# Patient Record
Sex: Female | Born: 1960 | Race: White | Hispanic: No | Marital: Married | Smoking: Current every day smoker
Health system: Southern US, Community
[De-identification: ages and names within clinical notes are randomized; demographics above are authoritative.]

## PROBLEM LIST (undated history)

## (undated) DIAGNOSIS — C801 Malignant (primary) neoplasm, unspecified: Secondary | ICD-10-CM

## (undated) DIAGNOSIS — M419 Scoliosis, unspecified: Secondary | ICD-10-CM

## (undated) DIAGNOSIS — M519 Unspecified thoracic, thoracolumbar and lumbosacral intervertebral disc disorder: Secondary | ICD-10-CM

## (undated) DIAGNOSIS — G510 Bell's palsy: Secondary | ICD-10-CM

## (undated) DIAGNOSIS — M8580 Other specified disorders of bone density and structure, unspecified site: Secondary | ICD-10-CM

## (undated) DIAGNOSIS — M1990 Unspecified osteoarthritis, unspecified site: Secondary | ICD-10-CM

## (undated) DIAGNOSIS — M5430 Sciatica, unspecified side: Secondary | ICD-10-CM

## (undated) DIAGNOSIS — M797 Fibromyalgia: Secondary | ICD-10-CM

## (undated) HISTORY — DX: Unspecified thoracic, thoracolumbar and lumbosacral intervertebral disc disorder: M51.9

## (undated) HISTORY — DX: Unspecified osteoarthritis, unspecified site: M19.90

## (undated) HISTORY — DX: Fibromyalgia: M79.7

## (undated) HISTORY — DX: Other specified disorders of bone density and structure, unspecified site: M85.80

## (undated) HISTORY — DX: Scoliosis, unspecified: M41.9

## (undated) HISTORY — DX: Sciatica, unspecified side: M54.30

## (undated) HISTORY — DX: Malignant (primary) neoplasm, unspecified: C80.1

---

## 1993-07-20 HISTORY — PX: NOSE SURGERY: SHX723

## 1999-11-07 ENCOUNTER — Encounter (INDEPENDENT_AMBULATORY_CARE_PROVIDER_SITE_OTHER): Payer: Self-pay | Admitting: Specialist

## 1999-11-07 ENCOUNTER — Ambulatory Visit (HOSPITAL_COMMUNITY): Admission: RE | Admit: 1999-11-07 | Discharge: 1999-11-07 | Payer: Self-pay | Admitting: Gastroenterology

## 2000-01-22 ENCOUNTER — Ambulatory Visit (HOSPITAL_COMMUNITY): Admission: RE | Admit: 2000-01-22 | Discharge: 2000-01-22 | Payer: Self-pay | Admitting: Gastroenterology

## 2000-01-22 ENCOUNTER — Encounter: Payer: Self-pay | Admitting: Gastroenterology

## 2000-02-18 HISTORY — PX: CHOLECYSTECTOMY: SHX55

## 2000-03-10 ENCOUNTER — Encounter (INDEPENDENT_AMBULATORY_CARE_PROVIDER_SITE_OTHER): Payer: Self-pay | Admitting: Specialist

## 2000-03-10 ENCOUNTER — Observation Stay (HOSPITAL_COMMUNITY): Admission: RE | Admit: 2000-03-10 | Discharge: 2000-03-10 | Payer: Self-pay | Admitting: Surgery

## 2001-07-20 HISTORY — PX: BUNIONECTOMY: SHX129

## 2001-12-27 ENCOUNTER — Other Ambulatory Visit: Admission: RE | Admit: 2001-12-27 | Discharge: 2001-12-27 | Payer: Self-pay | Admitting: Obstetrics and Gynecology

## 2002-03-09 ENCOUNTER — Encounter: Payer: Self-pay | Admitting: Emergency Medicine

## 2002-03-09 ENCOUNTER — Emergency Department (HOSPITAL_COMMUNITY): Admission: EM | Admit: 2002-03-09 | Discharge: 2002-03-09 | Payer: Self-pay | Admitting: Emergency Medicine

## 2002-03-24 ENCOUNTER — Encounter: Admission: RE | Admit: 2002-03-24 | Discharge: 2002-03-24 | Payer: Self-pay | Admitting: Orthopaedic Surgery

## 2002-03-24 ENCOUNTER — Encounter: Payer: Self-pay | Admitting: Orthopaedic Surgery

## 2002-10-01 ENCOUNTER — Emergency Department (HOSPITAL_COMMUNITY): Admission: EM | Admit: 2002-10-01 | Discharge: 2002-10-01 | Payer: Self-pay | Admitting: Emergency Medicine

## 2002-10-19 HISTORY — PX: NECK SURGERY: SHX720

## 2002-10-25 ENCOUNTER — Encounter: Payer: Self-pay | Admitting: Orthopaedic Surgery

## 2002-10-25 ENCOUNTER — Inpatient Hospital Stay (HOSPITAL_COMMUNITY): Admission: RE | Admit: 2002-10-25 | Discharge: 2002-10-26 | Payer: Self-pay | Admitting: Orthopaedic Surgery

## 2002-10-26 ENCOUNTER — Encounter: Payer: Self-pay | Admitting: Orthopaedic Surgery

## 2003-02-13 ENCOUNTER — Other Ambulatory Visit: Admission: RE | Admit: 2003-02-13 | Discharge: 2003-02-13 | Payer: Self-pay | Admitting: Obstetrics and Gynecology

## 2003-02-16 ENCOUNTER — Encounter: Payer: Self-pay | Admitting: Otolaryngology

## 2003-02-16 ENCOUNTER — Ambulatory Visit (HOSPITAL_COMMUNITY): Admission: RE | Admit: 2003-02-16 | Discharge: 2003-02-16 | Payer: Self-pay | Admitting: Otolaryngology

## 2003-07-10 ENCOUNTER — Emergency Department (HOSPITAL_COMMUNITY): Admission: EM | Admit: 2003-07-10 | Discharge: 2003-07-11 | Payer: Self-pay | Admitting: Emergency Medicine

## 2004-02-15 ENCOUNTER — Ambulatory Visit (HOSPITAL_COMMUNITY): Admission: RE | Admit: 2004-02-15 | Discharge: 2004-02-15 | Payer: Self-pay | Admitting: Neurology

## 2004-05-20 HISTORY — PX: BACK SURGERY: SHX140

## 2004-05-21 ENCOUNTER — Inpatient Hospital Stay (HOSPITAL_COMMUNITY): Admission: RE | Admit: 2004-05-21 | Discharge: 2004-05-26 | Payer: Self-pay | Admitting: Orthopaedic Surgery

## 2005-01-07 ENCOUNTER — Other Ambulatory Visit: Admission: RE | Admit: 2005-01-07 | Discharge: 2005-01-07 | Payer: Self-pay | Admitting: Obstetrics and Gynecology

## 2006-04-14 ENCOUNTER — Encounter: Admission: RE | Admit: 2006-04-14 | Discharge: 2006-04-14 | Payer: Self-pay | Admitting: Orthopaedic Surgery

## 2006-05-24 ENCOUNTER — Encounter: Admission: RE | Admit: 2006-05-24 | Discharge: 2006-05-24 | Payer: Self-pay | Admitting: Orthopaedic Surgery

## 2006-08-24 ENCOUNTER — Encounter: Admission: RE | Admit: 2006-08-24 | Discharge: 2006-08-24 | Payer: Self-pay | Admitting: Orthopaedic Surgery

## 2006-09-29 ENCOUNTER — Encounter: Admission: RE | Admit: 2006-09-29 | Discharge: 2006-09-29 | Payer: Self-pay | Admitting: Orthopaedic Surgery

## 2006-11-11 ENCOUNTER — Encounter: Admission: RE | Admit: 2006-11-11 | Discharge: 2006-11-11 | Payer: Self-pay | Admitting: Orthopaedic Surgery

## 2006-12-31 ENCOUNTER — Encounter: Admission: RE | Admit: 2006-12-31 | Discharge: 2006-12-31 | Payer: Self-pay | Admitting: Orthopaedic Surgery

## 2007-01-26 ENCOUNTER — Encounter: Admission: RE | Admit: 2007-01-26 | Discharge: 2007-01-26 | Payer: Self-pay | Admitting: Orthopaedic Surgery

## 2007-02-17 ENCOUNTER — Encounter
Admission: RE | Admit: 2007-02-17 | Discharge: 2007-05-18 | Payer: Self-pay | Admitting: Physical Medicine & Rehabilitation

## 2007-02-17 ENCOUNTER — Ambulatory Visit: Payer: Self-pay | Admitting: Physical Medicine & Rehabilitation

## 2007-03-28 ENCOUNTER — Ambulatory Visit: Payer: Self-pay | Admitting: Physical Medicine & Rehabilitation

## 2007-04-25 ENCOUNTER — Ambulatory Visit: Payer: Self-pay | Admitting: Physical Medicine & Rehabilitation

## 2007-06-21 ENCOUNTER — Ambulatory Visit: Payer: Self-pay | Admitting: Physical Medicine & Rehabilitation

## 2007-06-21 ENCOUNTER — Encounter
Admission: RE | Admit: 2007-06-21 | Discharge: 2007-09-19 | Payer: Self-pay | Admitting: Physical Medicine & Rehabilitation

## 2007-08-11 ENCOUNTER — Ambulatory Visit: Payer: Self-pay | Admitting: Physical Medicine & Rehabilitation

## 2007-10-06 ENCOUNTER — Encounter
Admission: RE | Admit: 2007-10-06 | Discharge: 2008-01-04 | Payer: Self-pay | Admitting: Physical Medicine & Rehabilitation

## 2007-10-06 ENCOUNTER — Ambulatory Visit: Payer: Self-pay | Admitting: Physical Medicine & Rehabilitation

## 2007-11-07 ENCOUNTER — Ambulatory Visit: Payer: Self-pay | Admitting: Physical Medicine & Rehabilitation

## 2007-12-01 ENCOUNTER — Ambulatory Visit: Payer: Self-pay | Admitting: Physical Medicine & Rehabilitation

## 2007-12-28 ENCOUNTER — Ambulatory Visit: Payer: Self-pay | Admitting: Physical Medicine & Rehabilitation

## 2008-01-25 ENCOUNTER — Encounter
Admission: RE | Admit: 2008-01-25 | Discharge: 2008-04-13 | Payer: Self-pay | Admitting: Physical Medicine & Rehabilitation

## 2008-01-26 ENCOUNTER — Ambulatory Visit: Payer: Self-pay | Admitting: Physical Medicine & Rehabilitation

## 2008-02-17 ENCOUNTER — Ambulatory Visit: Payer: Self-pay | Admitting: Physical Medicine & Rehabilitation

## 2008-03-16 ENCOUNTER — Ambulatory Visit: Payer: Self-pay | Admitting: Physical Medicine & Rehabilitation

## 2008-04-13 ENCOUNTER — Ambulatory Visit: Payer: Self-pay | Admitting: Physical Medicine & Rehabilitation

## 2008-05-02 ENCOUNTER — Encounter
Admission: RE | Admit: 2008-05-02 | Discharge: 2008-07-31 | Payer: Self-pay | Admitting: Physical Medicine & Rehabilitation

## 2008-05-11 ENCOUNTER — Ambulatory Visit: Payer: Self-pay | Admitting: Physical Medicine & Rehabilitation

## 2008-06-08 ENCOUNTER — Ambulatory Visit: Payer: Self-pay | Admitting: Physical Medicine & Rehabilitation

## 2008-08-01 ENCOUNTER — Encounter
Admission: RE | Admit: 2008-08-01 | Discharge: 2008-08-03 | Payer: Self-pay | Admitting: Physical Medicine & Rehabilitation

## 2008-08-03 ENCOUNTER — Ambulatory Visit: Payer: Self-pay | Admitting: Physical Medicine & Rehabilitation

## 2008-09-26 ENCOUNTER — Ambulatory Visit: Payer: Self-pay | Admitting: Physical Medicine & Rehabilitation

## 2008-09-26 ENCOUNTER — Encounter
Admission: RE | Admit: 2008-09-26 | Discharge: 2008-11-21 | Payer: Self-pay | Admitting: Physical Medicine & Rehabilitation

## 2008-11-21 ENCOUNTER — Ambulatory Visit: Payer: Self-pay | Admitting: Physical Medicine & Rehabilitation

## 2009-01-10 ENCOUNTER — Encounter
Admission: RE | Admit: 2009-01-10 | Discharge: 2009-04-10 | Payer: Self-pay | Admitting: Physical Medicine & Rehabilitation

## 2009-01-15 ENCOUNTER — Ambulatory Visit: Payer: Self-pay | Admitting: Physical Medicine & Rehabilitation

## 2009-02-21 ENCOUNTER — Ambulatory Visit: Payer: Self-pay | Admitting: Physical Medicine & Rehabilitation

## 2009-03-13 ENCOUNTER — Ambulatory Visit: Payer: Self-pay | Admitting: Physical Medicine & Rehabilitation

## 2009-04-11 ENCOUNTER — Ambulatory Visit: Payer: Self-pay | Admitting: Physical Medicine & Rehabilitation

## 2009-04-11 ENCOUNTER — Encounter
Admission: RE | Admit: 2009-04-11 | Discharge: 2009-07-10 | Payer: Self-pay | Admitting: Physical Medicine & Rehabilitation

## 2009-05-13 ENCOUNTER — Ambulatory Visit: Payer: Self-pay | Admitting: Physical Medicine & Rehabilitation

## 2009-06-11 ENCOUNTER — Ambulatory Visit: Payer: Self-pay | Admitting: Physical Medicine & Rehabilitation

## 2010-08-09 ENCOUNTER — Encounter: Payer: Self-pay | Admitting: Internal Medicine

## 2010-08-10 ENCOUNTER — Encounter: Payer: Self-pay | Admitting: Otolaryngology
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
## 2010-12-02 NOTE — Assessment & Plan Note (Signed)
HISTORY OF PRESENT ILLNESS:  Sheryl Campbell is back regarding her back pain.  She initially had good results with the therapy that integrated but  November was a bad month for her, and she notes that the pain seemed to  flare up again.  This seems to be the overall cycle for her.  She does  perform her maintenance exercises and has had further therapy.  She gets  fitted with her cervical traction unit this week.  She does feel that  stretching helps temporarily at least.  She feels that her posture is  overall better.  She is having a new chair ordered for her at work to  improve her ergonomic situation.  The patient rates her pain on average  a 3-8/10.  She describes it as intermittent, sharp, aching, and  tingling.  The pain is in both shoulders but most predominantly the left  shoulder around the scapula.  The pain is not always consistent in  location.  She also is having some low back and leg pain once again  today on the left side.  The patient is on a fentanyl path 12 mcg q. 72  hour with Norco for breakthrough pain.  She did not find the trigger  point injections, we did in October, overly beneficial.   REVIEW OF SYSTEMS:  Notable for tingling, dizziness, some anxiety.  Other pertinent positives listed above.  A full review is written in  health history section of chart.   SOCIAL HISTORY:  Without change.  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   PHYSICAL EXAMINATION:  VITAL SIGNS:  Blood pressure 145/94, pulse 110,  respiratory rate 18, she is sating 100% on room air.  GENERAL:  The patient is pleasant, alert and oriented x3.  Affect is  bright and appropriate.  Gait is generally stable.  EXTREMITIES:  She continues to have some asymmetry in her scapulae with  some tightness particularly in the right rhomboid region.  The scapulae  appear to be rotated somewhat to the left.  The pain did not appear to  be specifically provoked with any  particular range of motion or  palpation today.  No focal trigger points were appreciated.  Muscle  function is 5/5.  Sensory exam is intact.  Postoperative scarring noted.  HEART:  Regular rate and rhythm.  LUNGS:  Clear.  ABDOMEN:  Soft and nontender.   ASSESSMENT:  1. Chronic low back, related to childhood scoliosis with Huntington      rod placement.  The patient with persistent thoracic and low back      pain.  She also has had a cervical discectomy and fusion for disk      disease.  2. Leg length discrepancy on the left of 1.5 cm.  3. Myofascial cervical and parascapular dysfunction, although this is      somewhat improved after therapies.   PLAN:  1. We will hold off on further trigger point injections today, as      these did not prove to be overly beneficial.  2. We will increase the Duragesic patch to 25 mcg q. 72 hours with      Norco for breakthrough pain.  3. Change muscle relaxant from Robaxin to [facility] and see if this      provides her any extra benefit.  4. Begin home cervical traction.  5. We will see her back in followup in about a month's time in the  nurse clinic and I will see her back in 2      months.  Consider low back intervention, depending on her course      there.  6. Observe the effects of ergonomic adjustments in the office place.      [name], M.D.  Electronically Signed     ZTS/MedQ  D:  06/22/2007 11:31:42  T:  06/22/2007 13:52:39  Job #:  [id number]   cc:   Sharolyn Douglas, M.D.  Fax: [phone]

## 2010-12-02 NOTE — Assessment & Plan Note (Signed)
Sheryl Campbell is back regarding chronic cervical and low back pain.  Pain has  been remained generally stable around 4/10.  She has had some flares  recently as she has been active with her children at the beach and doing  some painting at home.  She describes her pain as sharp, stabbing,  aching, tingling.  Pain interferes with general activity, relations with  others, enjoyment of life on a moderate level.  Sleep is fair.  She  remains on a fentanyl patch 50 mcg q.72 h. and Norco 10/325 one q.6 h.  p.r.n. for breakthrough pain.  She has Cymbalta daily still for her mood  and neuropathic symptoms.   REVIEW OF SYSTEMS:  Unchanged.  Full 14-point review is in the written  health and history section of the chart.   SOCIAL HISTORY:  The patient is married, living with her husband.  She  is still smoking a pack of cigarettes per day.   PHYSICAL EXAMINATION:  Blood pressure is 156/100, pulse is 81,  respiratory rate 18, she is sating 98% on room air.  The patient is  pleasant, alert, and oriented x3.  She has some pain in rotation along  the thoracic cervical spine.  Spine is rotated clockwise.  Right scapula  somewhat retracted.  Scarring noted around the operative site.  Strength  is really 5/5, normal sensation and reflex today.  She is limited to  about 30 degrees of neck flexion, and 30-40 degrees of extension on exam  today.  Back, low back was notable for 50-60 degrees of forward flexion,  20 degrees of extension today.  She had some pain with bending laterally  to either side in the neck and low back today.  Heart is regular.  Chest  is clear.  Abdomen is soft, nontender.   ASSESSMENT:  1. Chronic low back pain related to the scoliosis, status post      Harrington rod placement.  2. History of cervical diskectomy and fusion with postsurgical pain.  3. Left leg length discrepancy.  4. Low back pain with lumbar radiculitis.  5. Bicipital tendonitis.   PLAN:  1. The patient is fairly  functional with her current regimen.  I am      not inclined to make any changes at this point.  Continue fentanyl      patch 50 mcg q.72 h. #10, Norco 10/325 one q.6 h. p.r.n. #120.  She      is given 2 months of prescriptions today.  We will check her UDS      per protocol.  2. Exercise and range of motion to tolerance.  3. No changes in Cymbalta.  4. I will see her back in 4 months with me, 2 months with nursing.      [name], M.D.  Electronically Signed     ZTS/MedQ  D:  01/15/2009 10:37:08  T:  01/16/2009 01:54:10  Job #:  [id number]
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
## 2010-12-02 NOTE — Assessment & Plan Note (Signed)
HISTORY OF PRESENT ILLNESS:  Sheryl Campbell is back regarding her low back pain.  She is a bit frustrated over the fact that I did not provide her  adequate narcotic medication to cover pain. She continued on Norco and  wondered why I would not give her more. I explained to her that this is  a process and we are evaluating her, performing drug screens, etc. She  did see integrated therapies and they are working on massage, ultrasound  traction, bio-feedback. She seems to be getting some results with this.  I obtained her procedure reports from [facility]. It appears on  January 26, 2007, that she had a L4 selective nerve root block on the left  performed. Another was performed on December 31, 2006. In March, she had a  C7, T1 inner-laminar injection and then in November 2007, had another at  this level. Apparently, one was shaded to the right and one was shaded  to the left. The cervical injections were performed by Dr. Barrington Ellison. One  of the lumbar injections was performed by Dr. Barrington Ellison and the other by  Dr. Alfredo Batty. The patient reports significant pain in the left shoulder  and peri-scapular area, as well as the left low back. She rates her pain  a 5 out of 10 today, while 8 out of 10 on average. She describes the  pain as sharp, burning, stabbing, tingling, constant aching. Sleep is  poor. The pain interferes with general activity, relationship with  others, enjoyment of life on a moderate to severe level.   REVIEW OF SYSTEMS:  Notable for the above as well as tingling, trouble  walking, weight gain, diarrhea, easy bleeding, poor appetite, and urine  retention. Full review is in the written health and history section of  the chart.   SOCIAL HISTORY:  The patient is married. She still smokes. 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   PHYSICAL EXAMINATION:  VITAL SIGNS:  Blood pressure 140/77, pulse 113,  respiratory rate 18. She is sating 100% on room air.  BACK:  On examination  of the low back, she has significant prominence of  the right scapula. There is also spasm in the left trapezius and left  lower lumbar paraspinal and oblique muscles. There is an ongoing  dextroscoliosis of the thoracic spine with mild kyphosis. She is painful  to touch in the peri-scapular region on the left as well as the left  trapezium muscle in surrounding areas. Left low back is painful to  palpation. She had pain with flexion and extension today. Range of  motion  was generally stable with pain with flexion and extension. She  could have pelvic asymmetries with some elevation of the right  hemipelvis. There is mild levo scoliosis of the lumbar spine as well.  Surgical scar is noted. Straight leg testing was equivocal.   ASSESSMENT:  1. History of diffuse back pain with a very complicated history      including childhood scoliosis with Huntington rod placement.  2. History of cervical disk disease with cervical fusion diskectomy.  3. Lumbar facet syndrome, question SI joint dysfunction.  4. Left leg length discrepancy of 1.5 cm.  5. Diffuse myofascial dysfunction of the left shoulder girdle,      cervical musculature, as well as low back muscular as the      compensation for her spinal deformities.   PLAN:  1. I think we are on the right track with therapy. I  would like to      continue to work on bio-feedback and posture. She would do well      with traction, which was suggested by integrated therapies and I      think she would benefit from a home unit.  2. Will start her on a low-dose Duragesic patch, to see if we can get      her tolerating her movement a bit better and get her baseline pain      control improved.  3. Increase Zanaflex to 4 mg q.8 hours for muscle spasm. She will      begin this after she has been on the Fentanyl for approximately a      week.  4. Refill Norco 10/325 #120.  5. Consider lumbar facet blocks versus SI joint dysfunction, depending      on  her course here over the next few weeks.      Sheryl Campbell, M.D.  Electronically Signed     ZTS/MedQ  D:  03/29/2007 12:13:50  T:  03/29/2007 21:00:21  Job #:  [id number]   cc:   Sheryl Campbell, M.D.  Fax: [phone]

## 2010-12-02 NOTE — Assessment & Plan Note (Signed)
HISTORY:  Sheryl Campbell is back regarding her cervical and lumbar pain.  She  feels that the cervical traction helps her temporarily when she uses it  and likes it for the most part.  The Duragesic Patch is some benefit as  well as Norco for breakthrough pain.  She did not tolerate the Voltaren  gel nor find it overly beneficial, so she stopped it.  She is still  working full-time.  She complains of pain occasionally down the left  leg, into the arms and neck of a burning variety.  She rates her pain 5-  7 on a scale of 1-10 depending on the day.  The pain interferes with  general activity, relation with others, enjoyment of life on a moderate  level.  Sleep is fair.   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   REVIEW OF SYSTEMS:  Notable for spasms, numbness, tingling, anxiety.  Other pertinent positives listed above.  Full review is in the written  health and history section of the chart.   PHYSICAL EXAMINATION:  VITAL SIGNS:  Blood pressure 158/98, pulse 86,  respiratory rate 18, she is satting 100% on room air.  GENERAL:  The patient is pleasant, alert and oriented x3.  Affect is  bright and appropriate.  Gait is stable.  Cognitively, she is  appropriate.  BACK:  Continues to have some tightness and asymmetry along the lumbar  and periscapular regions.  Surgical scarring is noted.  Pain is  plus/minus provoked with range of motion both passive and active.  No  focal trigger points are noted.  Muscle function is 5/5.  Gait is  stable.  HEART:  Regular rate.  CHEST:  Clear.  ABDOMEN:  Soft, nontender.   ASSESSMENT:  1. Chronic low back pain related to childhood scoliosis with      Huntington rod placement.  2. History of cervical diskectomy and fusion with persistent thoracic      and periscapular pain.  3. History of left leg length discrepancy.  4. Myofascial cervical and periscapular dysfunction.   PLAN:  1. We decided to add anticonvulsant to see if we could control some of      the neuropathic/radicular symptoms she is experiencing.  We added      Keppra 250 mg q.h.s. for 1 week and then b.i.d. thereafter.  2. Continue Duragesic Patch 25 mcg q.72 h. with Norco for breakthrough      pain.  3. Continue Soma for breakthrough spasm.  4. Encouraged exercise, range of motion, her cervical home traction.  5. I will see her back in about 3 months' time.      [name], M.D.  Electronically Signed     ZTS/MedQ  D:  10/07/2007 14:46:43  T:  10/07/2007 17:01:34  Job #:  [id number]
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
## 2010-12-02 NOTE — Assessment & Plan Note (Signed)
Sheryl Campbell is back regarding chronic cervical and low back pain.  She  complains of more low back pain recently.  She has been caring for 48-  year-old and 61-year-old grandchildren.  She has some pain occasionally  in a radicular distribution down the posterior more than anterior aspect  of the legs, left greater than right.  Neck has been generally stable  and will vary from day to day.  She rates her pain at 5/10 on average.  She got this intermittent and aching.  Pain interferes with general  activity, relations with others, and enjoyment of life in a mild level.  Sleep is fair.  She remains on fentanyl patch 50 mcg q.72 h., Norco  10/325 one q.6 h. p.r.n.  She is not on anything other than Cymbalta for  neuropathic pain.   REVIEW OF SYSTEMS:  Notable for the above.  She is generally happy with  things as they are and seems to find enjoyment in her grandchildren and  their care.  They are bit stressful at times.   PHYSICAL EXAMINATION:  VITAL SIGNS:  Blood pressure 146/82, pulse 82,  respiratory rate 18, she is sating 100% on room air.  GENERAL:  The patient is pleasant, alert, and oriented x3.  She  continues to have some pain along the cervical surgery site with some  asymmetry as usual.  There is scarring as well noted.  Strength remains  5/5 in all four extremities with normal reflexes and 2/2 sensation.  HEART:  Regular.  CHEST:  Clear.  ABDOMEN:  Soft and nontender.  Straight leg raising reveals some  hamstring tightness bilaterally but nothing more consistently.   ASSESSMENT:  1. Chronic low back pain related to as a child had scoliosis and      Harrington rod placement.  2. History of cervical diskectomy and fusion with postsurgical pain.  3. Left leg length discrepancy.  4. Likely lumbar radiculitis related to the above.  5. Bicipital tendonitis.   PLAN:  1. We will maintain fentanyl 50 mcg q.72 h., Norco 10/325 one q.6 h.      p.r.n.  She is given 2 months sets of  prescriptions.  2. I told her to work on low back and lower extremity strengthening      and range of motion as well as core muscle exercises.  If symptoms      progress, need to look at trying anticonvulsant, perhaps looking at      another MRI of the low back.  3. Continue Cymbalta.  4. I will see her back in 2 months with nursing and in 4 months with      me.      [name], M.D.  Electronically Signed     ZTS/MedQ  D:  09/26/2008 11:52:59  T:  09/27/2008 00:18:00  Job #:  [id number]

## 2010-12-02 NOTE — Assessment & Plan Note (Signed)
HISTORY OF PRESENT ILLNESS:  Ms. Chamberlain is back regarding her cervical  and thoracic pain.  She did better with prednisone taper, however, no  more of this seemed to exacerbate the neck symptoms while housecleaning.  Pain seems to worse with activities such as this.  She rates her pain on  average of 5 to 6 out of 10.  She describe this as sharp, tingling,  aching.  There is still occasionally pain into the right arm.  The pain  is relieved somewhat with the Duragesic patch as well as her  hydrocodone.  She does try some stretching and range of motion  exercises.   REVIEW OF SYSTEMS:  Notable for the above as well.  Perhaps decrease in  her mood.   SOCIAL HISTORY:  Unchanged.   PHYSICAL EXAMINATION:  Blood pressure 145/87, pulse 102, respiratory  rate 22, and she is saturating 98% on room air. The patient is pleasant,  alert, and oriented x3.  Continues to have asymmetries of her cervical,  thoracic, and they extend to lumbar spine today.  She is rigid around  the shoulders bilaterally.  Right is more tender than the left.  She had  some pain with right arm movement but seemed consistently so.  Her  strength is only 5/5. Normal sensory exam. Heart is regular. Chest is  clear. Abdomen is soft and nontender.   ASSESSMENT:  1. Chronic back pain related to childhood scoliosis with uneven rod      placement.  2. History of cervical diskectomy and fusion.  3. Persistent periscapular and cervical pain.  4. Left leg length discrepancy.   PLAN:  1. I think we need to shift gears in pain management at this point.      We will focus on more essential  and chronic factors.  We will      increase Duragesic patch to 50 mcg q.72 h.  2. Add Cymbalta 30 mg daily for 1 week, then 60 mg thereafter.  3. Continue ibuprofen and Soma.  4. I would like her to continue with activity as much as she can      tolerate.  5. I will see her back in about 2 months with nurse clinic followup in      1  month's time.      [name], M.D.  Electronically Signed     ZTS/MedQ  D:  12/28/2007 13:21:55  T:  12/29/2007 06:25:47  Job #:  [id number]

## 2010-12-05 NOTE — Procedures (Signed)
. [facility]  Patient:    Sheryl Campbell, Sheryl Campbell                       MRN: [id number] Proc. Date: 11/07/99 Attending:  Florencia Reasons, M.D. CC:         Luna Fuse, M.D.                           Procedure Report  PROCEDURE:  Upper endoscopy with biopsies.  ENDOSCOPIST:  Florencia Reasons, M.D.  INDICATIONS:  This 50 year old female with Hemoccult-positive stool and dyspeptic symptoms.  FINDINGS:  Small esophageal nodules, otherwise normal.  DESCRIPTION OF PROCEDURE:  The nature, purpose, and risks of the procedure had een discussed with the patient who provided a written consent.  Sedation was fentanyl 50 mcg and Versed 5 mg IV, without arrhythmias or desaturation.  The Olympus small caliber adult video endoscope was passed easily under direct vision.  The vocal  cords and larynx looked normal.  The esophageal mucosa was normal, except for several small sessile nodules, suggestive of papillomas.  I biopsied a couple of the larger of these, which may have measured as much as 3.0 to 4.0 mm across.  There was no evidence of Barretts esophagus, reflux esophagitis, gastroesophageal reflux, varices, infection, neoplasia, rings, strictures, or hiatal hernia.  The stomach was entered.  It contained no significant residual.  The gastric mucosa was normal in all locations, including a retroflexion of the proximal stomach.  The pylorus, duodenal bulb, nd second duodenum also looked normal.  The patient tolerated this procedure well, and there were no apparent complications.  IMPRESSION: 1. Small esophageal nodules, pathology pending. 2. No source of dyspeptic symptoms or Hemoccult-positive stool    endoscopically evident.  PLAN:  Await pathology and the biopsies.  Proceed to colonoscopic evaluation. DD:  11/07/99 TD:  11/09/99 Job: [id number]

## 2010-12-05 NOTE — Discharge Summary (Signed)
Sheryl Campbell, Sheryl Campbell              ACCOUNT NO.:  [id number]   MEDICAL RECORD NO.:  [id number]          PATIENT TYPE:  INP   LOCATION:  5020                         FACILITY:  [facility]   PHYSICIAN:  Sharolyn Douglas, M.D.        DATE OF BIRTH:  September 16, 1960   DATE OF ADMISSION:  05/21/2004  DATE OF DISCHARGE:  05/26/2004                                 DISCHARGE SUMMARY   ADMITTING DIAGNOSES:  1.  Adult idiopathic scoliosis.  2.  History of urinary tract infections.  3.  History of Bell's palsy.  4.  Anemia.   DISCHARGE DIAGNOSES:  1.  Status post T3 to T11 posterior spinal fusion.  2.  Mild postoperative anemia.  3.  Postoperative hyperglycemia.  4.  Mild hyponatremia.  5.  Hypokalemia.  6.  Adult idiopathic scoliosis.  7.  History of urinary tract infections.  8.  History of Bell's palsy.   PROCEDURE:  On 05/21/04, the patient was taken to the operating room for T3  to T11 posterior spinal fusion with pedicle screws/fixing wires.  This was  done by Sharolyn Douglas, M.D., assistant [facility]  Anesthesia used was  general.   LABORATORY DATA:  On 05/20/04, CBC with differential was within normal limits  with exception of red cells 3.05, hemoglobin 10.0, hematocrit 28.8, RDW %  14.8, platelets 401.  On 05/20/04, PT/INR and PTT were within normal limits.  Comprehensive metabolic panel preoperatively was noted to be within normal  limits with the exception of total protein of 5.7.  Postoperatively, she had  a decrease in her sodium, reaching a low of 132 on 05/24/04, decrease in her  potassium, reaching a low of 2.9 on 05/22/04, increased gradually thereafter  and reached 3.2 by 05/24/04.  Glucose was slightly elevated, ranging from 112-  130 throughout her postoperative course.  BUN was decreased, ranging from 1-  5 throughout her postoperative course.  Calcium ranged from 8.0-8.4  postoperatively.  UA from 05/20/04 was negative.  Blood type on 05/20/04 was  type A, Rh positive, antibody  screen negative.   X-RAYS:  Chest x-ray from 05/21/04 showed left lower lobe density suggesting  a CT scan to the area.  Portable chest on 05/21/04 showed right central  venous catheter lying in superior vena cava, partially obscured by the right  Harrington rod, no pneumothorax, bilateral basilar atelectasis.  Portable  spine from 05/21/04 shows T3 to T11 spinal fusion for scoliosis.   BRIEF HISTORY:  The patient is a 50 year old female who has had progressive  problems with her back.  She continued with pain related to her scoliosis,  and requested to have surgery to correct the scoliosis if needed.  She would  prefer to go through this at her young age of 33, rather than waiting until  she is much older and severely affected by her scoliosis.  After reviewing  her case, Dr. Noel Gerold did recommend surgery, given the severity of her  scoliosis.  Risks and benefits of this were discussed with her.  She  indicated understanding and opted to proceed.   HOSPITAL COURSE:  On 05/21/04, the patient was taken to the operating room  for a T3 to T11 posterior spinal fusion with pedicle screw/fixing wires.  Surgeon was Sharolyn Douglas, M.D.  Assistant was [employer], Building services engineer.  General  anesthesia.  One Hemovac drain was placed.  There was 300 cc of blood loss  with 100 return via Cell Saver and 1 unit of autologous blood.  There were  no complications.  She was transferred to recovery room in stable condition.  There was a wake up test done intraoperatively, and she did show good motion  to her bilateral upper and lower extremities.  Postoperatively, routine  orthopedic spine protocol was followed, including 48 hours of prophylactic  antibiotics.  Incentive spirometry was used q. hour while awake.  TED hose  and SCDs as  well as early mobilization for DVT prophylaxis.  PT and OT  consults were obtained, who worked with the patient on progressive  ambulation program, back precautions and brace use and safety.   No brace was  needed.  Pain control initially was using a combination of Percocet p.o. as  well as morphine PCA.  She was transitioned over to p.o. strictly by  postoperative day #3, and pain control did remain adequate.  Regular home  medications were restarted postoperatively.  She progressed along very well  throughout her postoperative course.  Her first night, she described as a  bad night.  However, this was to be expected, given the amount of surgery.  For pain, we did schedule Percocet thereafter, and her pain control slowly  did improve.  We did notice hyperkalemia on her postoperative laboratory  studies.  She was started on K-Dur on 05/23/04, and her potassium slowly  increased thereafter.  On 05/23/04, her pain did continue to be limiting her.  Therefore, we added oxycodone-IR to the Percocet to improve her pain  control.  Her diet was advanced after flatus, slowly, to a regular diet.  She progressed well, without any problem.  By 05/26/04, the patient was doing  extremely well.  She was safe ambulating.  She understood her back  precautions.  She was doing extremely well and had met all orthopedic goals,  and was stable and ready for discharge home.  Medically, she was also  stable.   DISCHARGE PLAN:  The patient is a 50 year old female status post T3 to T11  posterior spinal fusion, doing well.   ACTIVITIES:  Daily ambulation.  No brace needed.  Back precautions at all  times.  No lifting heavier than 5 pounds.   WOUND CARE:  Daily dressing, to keep incision dry x5 days or until drainage  stops.   DIET:  Regular home diet.   The patient will be assisted by her family as well as [place] for home  health physical therapy and occupational therapy.  [facility] will provide her  home needs.   FOLLOWUP:  She is to follow up with Dr. Noel Gerold two weeks postoperatively,  instructed to call for an appointment.  MEDICATIONS:  Percocet 10/325 as needed for pain, Robaxin as needed for   muscle spasm, multivitamin daily, calcium daily, Colace 100 mg b.i.d.,  laxative as needed.  Resume home medications.   CONDITION ON DISCHARGE:  Stable, improved.   DISPOSITION:  The patient is being discharged to her home with her family as  well as home health assistance.    CM/MEDQ  D:  07/23/2004  T:  07/23/2004  Job:  [id number]

## 2010-12-05 NOTE — Op Note (Signed)
Sheryl Campbell, Sheryl Campbell              ACCOUNT NO.:  [id number]   MEDICAL RECORD NO.:  [id number]          PATIENT TYPE:  INP   LOCATION:  5020                         FACILITY:  [facility]   PHYSICIAN:  Sharolyn Douglas, M.D.        DATE OF BIRTH:  [birth year]   DATE OF PROCEDURE:  05/22/2004  DATE OF DISCHARGE:                                 OPERATIVE REPORT   PREOPERATIVE DIAGNOSIS:  Adult idiopathic scoliosis.   POSTOPERATIVE DIAGNOSIS:  Adult idiopathic scoliosis.   PROCEDURES:  1.  Posterior spinal arthrodesis T3 through T11.  2.  Segmental spinal instrumentation T3 through T11 utilizing hooks, screws      and sublaminar cables.  3.  Smith-Peterson type osteotomy at T6 and T7.  4.  Right posterior iliac crest bone graft supplemented with graft-on      allograft and local autogenous bone graft.   SURGEON:  Sharolyn Douglas, M.D.   ASSISTANT:  Verlin Fester, P.A.   ANESTHESIA:  General endotracheal anesthesia.   COMPLICATIONS:  None.   INDICATIONS FOR PROCEDURE:  The patient is a pleasant 50 year old female  with adult idiopathic scoliosis.  She has a documented progressive right  thoracic curve which now measures 50 degrees.  She has had a second opinion  at [facility] and the recommendation was for a spinal fusion procedure to help  with her pain and prevent further progression of her deformity.  She knows  the risks and benefits.   DESCRIPTION OF PROCEDURE:  The patient was properly identified in the  holding area, taken to the operating room, underwent general endotracheal  anesthesia without difficulty and given prophylactic IV antibiotics.  She  was carefully positioned on the Acromed four poster positioning frame. All  bony prominences were padded.  The patient's eyes were protected at all  times.  The back was prepped and draped in the usual sterile fashion.  An  incision was made over the right posterior iliac crest.  Dissection was  carried through the deep fascia.  The PSIS was  exposed.  The cap was removed  with a Leksell rongeur.  Copious amounts of autogenous bone graft harvested  from between the iliac tables using curettes.  The wound was irrigated and  Gelfoam was left in the iliac crest defect.  The deep fascia was closed with  #1 Vicryl and subcutaneous layer closed with 2-0 Vicryl followed by a  running 3-0 subcuticular Vicryl suture.  On the skin edges Benzoin and Steri-  Strips were placed.   We then turned our attention to the midline. An incision was made from T2  down to T12.  Dissection was carried sharply through the deep fascia.  Meticulous subperiosteal exposure was carried out to the tips of the  transverse processes.  Care was taken to protect the interspinous ligaments  at T2-3 and T11-12.  Deep retractors were placed.  Osteotomies were  completed at every level and the facet joints were decorticated. The spinous  processes were removed.  The curve was quite stiff and osteotomies were  carried out at T6 and T7 within the concavity using  a Smith-Peterson  technique.  We then turned our attention to placing instrumentation.  Transverse process hooks were placed downgoing at T3 bilaterally.  On the  left, an upgoing pedicle hook was placed at T3 and on the right an upgoing  pedicle hook placed at T4.  We then turned our attention to placing pedicle  screws at T10 and T11 bilaterally using anatomic probing technique.  6.5 x  40 mm screws were placed.  We had good screw purchase.  The pedicle holes  were palpated using a ball tipped feeler and there were no breeches.  We  placed a 5.5 x 40 mm screw at T9 on the right again with good purchase.  We  then turned our attention to placing sublaminar cables within the concavity  of the curve times three.  Stainless steel rods were then cut to length and  bent into normal physiologic kyphosis.  Starting on the left side, the rod  was placed into the proximal hooks and distal screw head. The rod was   rotated into normal sagittal plane and the sublaminar wires tightened  translating the spine and completing the reduction.  We felt there was a  good correction of the deformity. The right sided rod was then placed into  the hooks and screws distally to stabilize the construct.  Using compression  and distraction distally, the caudal vertebrae were leveled.  The hook claws  were seated.  The locking caps were sheared off.  We placed cross connectors  proximally and distally. A high speed bur was used to decorticate the  remaining transverse processes and pars.  The iliac crest bone graft along  with the local bone graft obtained from the spinous processes was packed  into the lateral gutters and into the facet joints.  This was supplemented  with graft-on bone graft substitute. At this point, a wake up test was  performed and the patient was able to wiggle her feet on command.  The wound  was then closed in layers with #1 Vicryl used on the deep fascia followed by  2-0 Vicryl in the subcutaneous layer and a running 3-0 subcuticular Vicryl  suture on the skin edges.  Benzoin and Steri-Strips were placed and a  sterile dressing was applied.  The patient was turned supine, extubated  without difficulty and transferred to the recovery room in stable condition  able to move her upper and lower extremities.       MC/MEDQ  D:  05/22/2004  T:  05/23/2004  Job:  [id number]

## 2010-12-05 NOTE — H&P (Signed)
NAME:  Sheryl Campbell, Sheryl Campbell              ACCOUNT NO.:  [id number]   MEDICAL RECORD NO.:  [id number]          PATIENT TYPE:  INP   LOCATION:  NA                           FACILITY:  [facility]   PHYSICIAN:  Dooley L. Underwood, P.A.DATE OF BIRTH:  [birth year]   DATE OF ADMISSION:  DATE OF DISCHARGE:                                HISTORY & PHYSICAL   CHIEF COMPLAINT:  Pain in my back.   PRESENT ILLNESS:  This 50 year old white female who was to have had surgery  on April 16, 2004.  Unfortunately, developed some pneumonia.  She has  been treated by physicians at [facility] and now has resolved her pneumonia.  She feels more comfortable and confident about going ahead with surgery.  Dr. Andi Devon and physician assistant Lawson Fiscal has cleared her for  surgery.   This is a 50 year old white female patient of Dr. Noel Gerold who has been seen  for continuing progress problems concerning her neck and back.  She  successfully underwent cervical surgery last year.  She now has continuing  pain and problems concerning her idiopathic adult thoracic scoliosis.  She  has elevation of the right shoulder with decompensation to the right.  She  is tender in the right upper back and has tingling that goes on all over  her back she relates to be nearly all the time.  She has had prior opinion  at [facility] and surgery was recommended for her scoliosis.  She  prefers to have it done here.  She has a good relationship with our  practice.   CURRENT MEDICATIONS:  1.  Lortab 10/650.  2.  Ibuprofen 800 mg (will stop prior to surgery).  3.  Sarafem 10 mg one daily.  4.  Loratidine 10 mg one daily.  5.  Premarin 0.3 mg daily.   The patient has history of urinary tract infections in the past, had  pneumonia as mentioned above in August 2005.  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  Her surgeries include cervical  diskectomy and fusion in April 2005; C5, C6, and C7.   Bunionectomy bilateral  feet in January 2003.  Removal of skin cancer in May 2004, and  cholecystectomy in August 2001.   Family history is positive in heart disease in a grandmother, hypertension  in brother and sister, diabetes in her grandmother.  Mother had lung and  skin cancer.   SOCIAL HISTORY:  The patient is married.  She is a Actor.  She has no intake of tobacco products.  Quit on May 04, 2004.  Has no  intake of alcohol products.  Caregiver after surgery will be her friend,  father, husband, and mother-in-law.  She lives in a [place] home.   REVIEW OF SYSTEMS:  CNS:  No seizure disorder, paralysis, numbness, double  vision.  RESPIRATORY:  No productive cough.  No hemoptysis, shortness of  breath.  CARDIOVASCULAR:  No chest pain.  No angina or orthopnea.  GI:  No  nausea, vomiting, melena or bloody stool.  GU:  No discharge, dysuria,  hematuria.  No incontinence.  MUSCULOSKELETAL:  Primarily in present  illness.   PHYSICAL EXAMINATION:  GENERAL:  Alert, cooperative, friendly 50 year old  white female who is somewhat anxious.  She is well groomed, alert and  oriented x3.  VITAL SIGNS:  Blood pressure 110/72, pulse 74, respirations 12.  HEENT:  Normocephalic.  PERRLA.  EOMI.  Oropharynx is clear.  CHEST:  Clear to auscultation.  No rhonchi.  No rales.  HEART:  Regular rate and rhythm.  No murmurs are heard.  ABDOMEN:  Soft, nontender.  Liver or spleen not felt.  GENITALIA, RECTAL, PELVIC, BREASTS:  Not done, not pertinent to present  illness.  EXTREMITIES:  Negative straight leg raising bilaterally.   Of note, x-rays have shown a 48-degree right thoracic curve measured from T4  to T10.  Thoracic curve bends down to 32 degrees.   ADMISSION DIAGNOSIS:  Adult idiopathic thoracic scoliosis with pain.   PLAN:  The patient will undergo T3 through T11 posterior spinal fusion with  hook, wires, screws, posterior iliac crest bone graft and allograft.  She   has okayed rib resection for this procedure.       DLU/MEDQ  D:  05/09/2004  T:  05/09/2004  Job:  [id number]   cc:   Gabriel Earing, M.D.  [address]  [place]  [place] [address]  Fax: [phone]   Daryll Brod, PA  [facility]  [address] [address]  [place], [place]

## 2011-02-24 ENCOUNTER — Other Ambulatory Visit: Payer: Self-pay | Admitting: Orthopaedic Surgery

## 2011-02-24 DIAGNOSIS — M545 Low back pain: Secondary | ICD-10-CM

## 2011-03-03 ENCOUNTER — Ambulatory Visit
Admission: RE | Admit: 2011-03-03 | Discharge: 2011-03-03 | Disposition: A | Discharge status: Home / Self Care | Payer: Self-pay | Source: Ambulatory Visit | Attending: Orthopaedic Surgery | Admitting: Orthopaedic Surgery

## 2011-03-03 DIAGNOSIS — M545 Low back pain, unspecified: Secondary | ICD-10-CM

## 2012-12-22 ENCOUNTER — Other Ambulatory Visit: Payer: Self-pay | Admitting: Orthopaedic Surgery

## 2012-12-22 DIAGNOSIS — M545 Low back pain: Secondary | ICD-10-CM

## 2012-12-29 ENCOUNTER — Ambulatory Visit
Admission: RE | Admit: 2012-12-29 | Discharge: 2012-12-29 | Disposition: A | Discharge status: Home / Self Care | Payer: No Typology Code available for payment source | Source: Ambulatory Visit | Attending: Orthopaedic Surgery | Admitting: Orthopaedic Surgery

## 2012-12-29 DIAGNOSIS — M545 Low back pain: Secondary | ICD-10-CM

## 2014-01-15 ENCOUNTER — Other Ambulatory Visit: Payer: Self-pay | Admitting: Family Medicine

## 2014-01-15 DIAGNOSIS — IMO0002 Reserved for concepts with insufficient information to code with codable children: Secondary | ICD-10-CM

## 2014-01-23 ENCOUNTER — Other Ambulatory Visit: Payer: Self-pay

## 2014-02-05 ENCOUNTER — Other Ambulatory Visit: Payer: Self-pay

## 2014-02-19 ENCOUNTER — Ambulatory Visit
Admission: RE | Admit: 2014-02-19 | Discharge: 2014-02-19 | Disposition: A | Discharge status: Home / Self Care | Payer: Medicare Other | Source: Ambulatory Visit | Attending: Family Medicine | Admitting: Family Medicine

## 2014-02-19 DIAGNOSIS — IMO0002 Reserved for concepts with insufficient information to code with codable children: Secondary | ICD-10-CM

## 2014-04-05 ENCOUNTER — Other Ambulatory Visit: Payer: Self-pay | Admitting: Rehabilitation

## 2014-04-05 DIAGNOSIS — M961 Postlaminectomy syndrome, not elsewhere classified: Secondary | ICD-10-CM

## 2014-04-20 ENCOUNTER — Other Ambulatory Visit: Payer: Medicare Other

## 2014-04-30 ENCOUNTER — Inpatient Hospital Stay: Admission: RE | Admit: 2014-04-30 | Payer: Medicare Other | Source: Ambulatory Visit

## 2014-06-10 ENCOUNTER — Ambulatory Visit
Admission: RE | Admit: 2014-06-10 | Discharge: 2014-06-10 | Disposition: A | Discharge status: Home / Self Care | Payer: Medicare Other | Source: Ambulatory Visit | Attending: Rehabilitation | Admitting: Rehabilitation

## 2014-06-10 ENCOUNTER — Other Ambulatory Visit: Payer: Medicare Other

## 2014-06-10 DIAGNOSIS — M961 Postlaminectomy syndrome, not elsewhere classified: Secondary | ICD-10-CM

## 2014-06-10 MED ORDER — GADOBENATE DIMEGLUMINE 529 MG/ML IV SOLN
10.0000 mL | Freq: Once | INTRAVENOUS | Status: AC | PRN
  Administered 2014-06-10: 10 mL via INTRAVENOUS

## 2015-04-18 ENCOUNTER — Other Ambulatory Visit: Payer: Self-pay | Admitting: Orthopaedic Surgery

## 2015-04-18 DIAGNOSIS — M4120 Other idiopathic scoliosis, site unspecified: Secondary | ICD-10-CM

## 2015-05-03 ENCOUNTER — Ambulatory Visit
Admission: RE | Admit: 2015-05-03 | Discharge: 2015-05-03 | Disposition: A | Discharge status: Home / Self Care | Payer: PPO | Source: Ambulatory Visit | Attending: Orthopaedic Surgery | Admitting: Orthopaedic Surgery

## 2015-05-03 DIAGNOSIS — M4120 Other idiopathic scoliosis, site unspecified: Secondary | ICD-10-CM

## 2015-08-05 DIAGNOSIS — G894 Chronic pain syndrome: Secondary | ICD-10-CM | POA: Diagnosis not present

## 2015-09-03 DIAGNOSIS — Z79891 Long term (current) use of opiate analgesic: Secondary | ICD-10-CM | POA: Diagnosis not present

## 2015-09-03 DIAGNOSIS — G894 Chronic pain syndrome: Secondary | ICD-10-CM | POA: Diagnosis not present

## 2015-09-03 DIAGNOSIS — Z79899 Other long term (current) drug therapy: Secondary | ICD-10-CM | POA: Diagnosis not present

## 2015-09-03 DIAGNOSIS — M542 Cervicalgia: Secondary | ICD-10-CM | POA: Diagnosis not present

## 2015-09-06 DIAGNOSIS — J309 Allergic rhinitis, unspecified: Secondary | ICD-10-CM | POA: Diagnosis not present

## 2015-10-01 DIAGNOSIS — G894 Chronic pain syndrome: Secondary | ICD-10-CM | POA: Diagnosis not present

## 2015-11-04 DIAGNOSIS — M545 Low back pain: Secondary | ICD-10-CM | POA: Diagnosis not present

## 2015-11-04 DIAGNOSIS — G894 Chronic pain syndrome: Secondary | ICD-10-CM | POA: Diagnosis not present

## 2015-11-28 DIAGNOSIS — M542 Cervicalgia: Secondary | ICD-10-CM | POA: Diagnosis not present

## 2015-11-28 DIAGNOSIS — G894 Chronic pain syndrome: Secondary | ICD-10-CM | POA: Diagnosis not present

## 2015-11-28 DIAGNOSIS — M7062 Trochanteric bursitis, left hip: Secondary | ICD-10-CM | POA: Diagnosis not present

## 2015-11-28 DIAGNOSIS — M791 Myalgia: Secondary | ICD-10-CM | POA: Diagnosis not present

## 2016-01-01 DIAGNOSIS — M7062 Trochanteric bursitis, left hip: Secondary | ICD-10-CM | POA: Diagnosis not present

## 2016-01-01 DIAGNOSIS — M5106 Intervertebral disc disorders with myelopathy, lumbar region: Secondary | ICD-10-CM | POA: Diagnosis not present

## 2016-01-01 DIAGNOSIS — G894 Chronic pain syndrome: Secondary | ICD-10-CM | POA: Diagnosis not present

## 2016-01-01 DIAGNOSIS — M791 Myalgia: Secondary | ICD-10-CM | POA: Diagnosis not present

## 2016-02-03 DIAGNOSIS — Z79899 Other long term (current) drug therapy: Secondary | ICD-10-CM | POA: Diagnosis not present

## 2016-02-03 DIAGNOSIS — M5106 Intervertebral disc disorders with myelopathy, lumbar region: Secondary | ICD-10-CM | POA: Diagnosis not present

## 2016-02-03 DIAGNOSIS — Z79891 Long term (current) use of opiate analgesic: Secondary | ICD-10-CM | POA: Diagnosis not present

## 2016-02-03 DIAGNOSIS — G894 Chronic pain syndrome: Secondary | ICD-10-CM | POA: Diagnosis not present

## 2016-03-02 DIAGNOSIS — Z6820 Body mass index (BMI) 20.0-20.9, adult: Secondary | ICD-10-CM | POA: Diagnosis not present

## 2016-03-02 DIAGNOSIS — Z4689 Encounter for fitting and adjustment of other specified devices: Secondary | ICD-10-CM | POA: Diagnosis not present

## 2016-03-02 DIAGNOSIS — G894 Chronic pain syndrome: Secondary | ICD-10-CM | POA: Diagnosis not present

## 2016-03-02 DIAGNOSIS — M791 Myalgia: Secondary | ICD-10-CM | POA: Diagnosis not present

## 2016-03-11 DIAGNOSIS — M5106 Intervertebral disc disorders with myelopathy, lumbar region: Secondary | ICD-10-CM | POA: Diagnosis not present

## 2016-04-03 DIAGNOSIS — M791 Myalgia: Secondary | ICD-10-CM | POA: Diagnosis not present

## 2016-04-03 DIAGNOSIS — G894 Chronic pain syndrome: Secondary | ICD-10-CM | POA: Diagnosis not present

## 2016-04-06 DIAGNOSIS — Z0000 Encounter for general adult medical examination without abnormal findings: Secondary | ICD-10-CM | POA: Diagnosis not present

## 2016-04-06 DIAGNOSIS — R6883 Chills (without fever): Secondary | ICD-10-CM | POA: Diagnosis not present

## 2016-04-06 DIAGNOSIS — J069 Acute upper respiratory infection, unspecified: Secondary | ICD-10-CM | POA: Diagnosis not present

## 2016-04-06 DIAGNOSIS — Z79891 Long term (current) use of opiate analgesic: Secondary | ICD-10-CM | POA: Diagnosis not present

## 2016-04-06 DIAGNOSIS — G8929 Other chronic pain: Secondary | ICD-10-CM | POA: Diagnosis not present

## 2016-04-23 DIAGNOSIS — M5106 Intervertebral disc disorders with myelopathy, lumbar region: Secondary | ICD-10-CM | POA: Diagnosis not present

## 2016-04-23 DIAGNOSIS — M5030 Other cervical disc degeneration, unspecified cervical region: Secondary | ICD-10-CM | POA: Diagnosis not present

## 2016-04-23 DIAGNOSIS — G894 Chronic pain syndrome: Secondary | ICD-10-CM | POA: Diagnosis not present

## 2016-04-23 DIAGNOSIS — M545 Low back pain: Secondary | ICD-10-CM | POA: Diagnosis not present

## 2016-04-27 DIAGNOSIS — J029 Acute pharyngitis, unspecified: Secondary | ICD-10-CM | POA: Diagnosis not present

## 2016-05-15 DIAGNOSIS — Z23 Encounter for immunization: Secondary | ICD-10-CM | POA: Diagnosis not present

## 2016-05-28 DIAGNOSIS — M5106 Intervertebral disc disorders with myelopathy, lumbar region: Secondary | ICD-10-CM | POA: Diagnosis not present

## 2016-05-28 DIAGNOSIS — G894 Chronic pain syndrome: Secondary | ICD-10-CM | POA: Diagnosis not present

## 2016-07-01 DIAGNOSIS — G894 Chronic pain syndrome: Secondary | ICD-10-CM | POA: Diagnosis not present

## 2016-07-01 DIAGNOSIS — M542 Cervicalgia: Secondary | ICD-10-CM | POA: Diagnosis not present

## 2016-07-01 DIAGNOSIS — M5106 Intervertebral disc disorders with myelopathy, lumbar region: Secondary | ICD-10-CM | POA: Diagnosis not present

## 2016-07-01 DIAGNOSIS — Z6820 Body mass index (BMI) 20.0-20.9, adult: Secondary | ICD-10-CM | POA: Diagnosis not present

## 2016-07-29 DIAGNOSIS — G8929 Other chronic pain: Secondary | ICD-10-CM | POA: Diagnosis not present

## 2016-07-29 DIAGNOSIS — R197 Diarrhea, unspecified: Secondary | ICD-10-CM | POA: Diagnosis not present

## 2016-07-29 DIAGNOSIS — R5383 Other fatigue: Secondary | ICD-10-CM | POA: Diagnosis not present

## 2016-07-30 ENCOUNTER — Emergency Department (HOSPITAL_COMMUNITY)
Admission: EM | Admit: 2016-07-30 | Discharge: 2016-07-30 | Disposition: A | Discharge status: Home / Self Care | Payer: PPO | Attending: Emergency Medicine | Admitting: Emergency Medicine

## 2016-07-30 ENCOUNTER — Encounter (HOSPITAL_COMMUNITY): Payer: Self-pay

## 2016-07-30 ENCOUNTER — Emergency Department (HOSPITAL_COMMUNITY): Payer: PPO

## 2016-07-30 DIAGNOSIS — R531 Weakness: Secondary | ICD-10-CM | POA: Diagnosis not present

## 2016-07-30 DIAGNOSIS — R200 Anesthesia of skin: Secondary | ICD-10-CM | POA: Insufficient documentation

## 2016-07-30 DIAGNOSIS — F17200 Nicotine dependence, unspecified, uncomplicated: Secondary | ICD-10-CM | POA: Insufficient documentation

## 2016-07-30 HISTORY — DX: Bell's palsy: G51.0

## 2016-07-30 LAB — CBC WITH DIFFERENTIAL/PLATELET
Basophils Absolute: 0 10*3/uL (ref 0.0–0.1)
Basophils Relative: 0 %
EOS ABS: 0.2 10*3/uL (ref 0.0–0.7)
EOS PCT: 3 %
HCT: 37 % (ref 36.0–46.0)
Hemoglobin: 12.1 g/dL (ref 12.0–15.0)
Lymphocytes Relative: 39 %
Lymphs Abs: 3.4 10*3/uL (ref 0.7–4.0)
MCH: 30.2 pg (ref 26.0–34.0)
MCHC: 32.7 g/dL (ref 30.0–36.0)
MCV: 92.3 fL (ref 78.0–100.0)
Monocytes Absolute: 0.5 10*3/uL (ref 0.1–1.0)
Monocytes Relative: 6 %
Neutro Abs: 4.5 10*3/uL (ref 1.7–7.7)
Neutrophils Relative %: 52 %
PLATELETS: 267 10*3/uL (ref 150–400)
RBC: 4.01 MIL/uL (ref 3.87–5.11)
RDW: 14.3 % (ref 11.5–15.5)
WBC: 8.7 10*3/uL (ref 4.0–10.5)

## 2016-07-30 LAB — BASIC METABOLIC PANEL
Anion gap: 9 (ref 5–15)
BUN: 12 mg/dL (ref 6–20)
CO2: 28 mmol/L (ref 22–32)
CREATININE: 0.45 mg/dL (ref 0.44–1.00)
Calcium: 9.2 mg/dL (ref 8.9–10.3)
Chloride: 102 mmol/L (ref 101–111)
Glucose, Bld: 90 mg/dL (ref 65–99)
POTASSIUM: 4 mmol/L (ref 3.5–5.1)
SODIUM: 139 mmol/L (ref 135–145)

## 2016-07-30 NOTE — ED Notes (Signed)
Patient taken to MRI

## 2016-07-30 NOTE — ED Notes (Signed)
Pt given ice chips per Alecia(RN)

## 2016-07-30 NOTE — ED Provider Notes (Signed)
[facility] DEPT Provider Note   CSN: [id number] Arrival date & time: 07/30/16  0356     History   Chief Complaint Chief Complaint  Patient presents with  . facial numbness    HPI Sheryl Campbell is a 56 y.o. female.  HPI   Patient is a 56 year old female with history of Bell's palsy, scoliosis status post back surgery and neck surgery who presents to the ED from home with complaints of numbness. Patient reports chronic history of intermittent numbness and weakness to bilateral upper and lower extremities since having her spinal surgeries. She notes over the past 5 days she has had mildly worsening intermittent numbness and weakness to her upper extremities. Patient reports the symptoms seem to worsen the more she uses her arms which she also reports is common with her typical numbness. Patient notes she saw her PCP yesterday regarding her numbness and was referred to a neurologist.Patient states yesterday evening she began having intermittent numbness to her entire face and scalp. Denies any aggravating or alleviating factors. Denies taking any medications for her symptoms. Denies fever, chills, headache, lightheadedness, dizziness, chest pain, shortness of breath, palpitations, abdominal pain, nausea, vomiting, urinary or bowel incontinence, saddle anesthesia, fall, injury, head injury, altered mental status. Patient reports history of Bell's palsy with a deficit of left-sided facial weakness.  Past Medical History:  Diagnosis Date  . Bell's palsy     There are no active problems to display for this patient.   Past Surgical History:  Procedure Laterality Date  . BACK SURGERY    . CHOLECYSTECTOMY    . NECK SURGERY      OB History    No data available       Home Medications    Prior to Admission medications   Not on File    Family History No family history on file.  Social History Social History  Substance Use Topics  . Smoking status: Current Every Day  Smoker    Packs/day: 1.00  . Smokeless tobacco: Never Used  . Alcohol use No     Allergies   Patient has no known allergies.   Review of Systems Review of Systems  Neurological: Positive for weakness and numbness.  All other systems reviewed and are negative.    Physical Exam Updated Vital Signs BP 126/66 (BP Location: Right Arm)   Pulse 85   Temp 98.3 F (36.8 C) (Oral)   Resp 16   Ht 5\' 2"  (1.575 m)   Wt 51.3 kg   SpO2 91%   BMI 20.67 kg/m   Physical Exam  Constitutional: She is oriented to person, place, and time. She appears well-developed and well-nourished. No distress.  HENT:  Head: Normocephalic and atraumatic.  Mouth/Throat: Oropharynx is clear and moist. No oropharyngeal exudate.  Eyes: Conjunctivae and EOM are normal. Pupils are equal, round, and reactive to light. Right eye exhibits no discharge. Left eye exhibits no discharge. No scleral icterus.  Neck: Normal range of motion. Neck supple.  Cardiovascular: Normal rate, regular rhythm, normal heart sounds and intact distal pulses.   Pulmonary/Chest: Effort normal and breath sounds normal. No respiratory distress. She has no wheezes. She has no rales. She exhibits no tenderness.  Abdominal: Soft. Bowel sounds are normal. She exhibits no distension and no mass. There is no tenderness. There is no rebound and no guarding. No hernia.  Musculoskeletal: Normal range of motion. She exhibits no edema, tenderness or deformity.  No midline C, T, or L tenderness. Full range  of motion of neck and back. Full range of motion of bilateral upper and lower extremities, with 5/5 strength. Sensation intact. 2+ radial and PT pulses. Cap refill <2 seconds. Patient able to stand and ambulate without assistance.    Neurological: She is alert and oriented to person, place, and time. She has normal strength. No sensory deficit. She displays a negative Romberg sign. Coordination and gait normal.  Left sided facial weakness including  forehead present with left eyebrow sagging and drooping of left corner of mouth at rest. Pt reports these are chronic and unchanged. Remaining cranial nerve exam unremarkable.  Skin: Skin is warm and dry. Capillary refill takes less than 2 seconds. She is not diaphoretic.  Nursing note and vitals reviewed.    ED Treatments / Results  Labs (all labs ordered are listed, but only abnormal results are displayed) Labs Reviewed  CBC WITH DIFFERENTIAL/PLATELET  BASIC METABOLIC PANEL    EKG  EKG Interpretation None       Radiology Mr Brain Wo Contrast  Result Date: 07/30/2016 CLINICAL DATA:  Acute onset of bilateral arm and hand numbness last night. EXAM: MRI HEAD WITHOUT CONTRAST TECHNIQUE: Multiplanar, multiecho pulse sequences of the brain and surrounding structures were obtained without intravenous contrast. COMPARISON:  02/15/2004 FINDINGS: Brain: Diffusion imaging does not show any acute or subacute infarction. The brainstem and cerebellum are normal. Cerebral hemispheres are normal. No atrophy, small or large vessel insult, mass lesion, hydrocephalus or extra-axial collection. Vascular: Major vessels at the base of the brain show flow. Skull and upper cervical spine: Normal Sinuses/Orbits: Mild mucosal thickening of the inferior right maxillary sinus. Other paranasal sinuses are clear. Small amount of fluid in the mastoid air cells on the right. Other: None significant IMPRESSION: No brain abnormality seen. Mastoid effusion on the right. Electronically Signed   By: Nelson Chimes M.D.   On: 07/30/2016 10:20    Procedures Procedures (including critical care time)  Medications Ordered in ED Medications - No data to display   Initial Impression / Assessment and Plan / ED Course  I have reviewed the triage vital signs and the nursing notes.  Pertinent labs & imaging results that were available during my care of the patient were reviewed by me and considered in my medical decision making  (see chart for details).  Clinical Course    Patient presents with intermittent episodes of numbness and weakness to her arms and legs for the past 5 days. Reports having chronic numbness and weakness associated to her back surgeries/scoliosis but notes it has mildly worsened over the past few days. Also reports having intermittent facial numbness that started yesterday afternoon. Denies any recent fall, trauma or head injury. Denies any other associated symptoms. Hx of of Bell's palsy, scoliosis status post back surgery and neck surgery. VSS. Exam revealed left sided facial weakness including forehead present with left eyebrow sagging and drooping of left corner of mouth at rest. Pt reports these are chronic and unchanged. Remaining cranial nerve exam unremarkable. Neuro exam otherwise unremarkable. No midline spinal tenderness. Discussed pt with Dr. Johnney Killian who also evaluated the pt. Plan to order basic labs and MRI brain for further evaluation of intermittent numbness/weakness. MRI brain showed right mastoid effusion, otherwise unremarkable. Discussed results and plan for d/c with pt. Plan to d/c pt home with PCP follow up and advised pt to follow up with her referred neurologist set-up by her PCP for further evaluation of her numbness. Pt given strict return precautions.   Final  Clinical Impressions(s) / ED Diagnoses   Final diagnoses:  Numbness    New Prescriptions New Prescriptions   No medications on file     Nona Dell, PA-C 07/30/16 [place], MD 07/30/16 [phone]

## 2016-07-30 NOTE — ED Triage Notes (Signed)
Pt states that Sat she started having L rib pain and bilateral arm numbness and hand. Pt states that she went to see PCP and referred to neurologist and then tonight started having numbness in her face, head and feet. Pt has a hx of bells palsy. L eye has a constant twitching. Neuro intact.

## 2016-07-30 NOTE — Discharge Instructions (Signed)
Continue taking her medications as prescribed. I recommend following up with your primary care provider for reevaluation regarding your recent blood work performed in their office and referral to neurology for further evaluation of your numbness. Please return to the Emergency Department if symptoms worsen or new onset of fever, chest pain, difficulty breathing, headache, lightheadedness, dizziness, visual changes, rash, swelling, weakness, unable to walk.

## 2016-07-31 DIAGNOSIS — G894 Chronic pain syndrome: Secondary | ICD-10-CM | POA: Diagnosis not present

## 2016-07-31 DIAGNOSIS — Z79899 Other long term (current) drug therapy: Secondary | ICD-10-CM | POA: Diagnosis not present

## 2016-07-31 DIAGNOSIS — M542 Cervicalgia: Secondary | ICD-10-CM | POA: Diagnosis not present

## 2016-07-31 DIAGNOSIS — Z79891 Long term (current) use of opiate analgesic: Secondary | ICD-10-CM | POA: Diagnosis not present

## 2016-07-31 DIAGNOSIS — M5106 Intervertebral disc disorders with myelopathy, lumbar region: Secondary | ICD-10-CM | POA: Diagnosis not present

## 2016-07-31 DIAGNOSIS — M4120 Other idiopathic scoliosis, site unspecified: Secondary | ICD-10-CM | POA: Diagnosis not present

## 2016-08-04 DIAGNOSIS — R209 Unspecified disturbances of skin sensation: Secondary | ICD-10-CM | POA: Diagnosis not present

## 2016-08-10 ENCOUNTER — Other Ambulatory Visit: Payer: Self-pay | Admitting: Pain Medicine

## 2016-08-19 DIAGNOSIS — F39 Unspecified mood [affective] disorder: Secondary | ICD-10-CM | POA: Diagnosis not present

## 2016-08-20 DIAGNOSIS — M542 Cervicalgia: Secondary | ICD-10-CM | POA: Diagnosis not present

## 2016-08-20 DIAGNOSIS — M545 Low back pain: Secondary | ICD-10-CM | POA: Diagnosis not present

## 2016-08-20 DIAGNOSIS — G894 Chronic pain syndrome: Secondary | ICD-10-CM | POA: Diagnosis not present

## 2016-08-25 ENCOUNTER — Encounter: Payer: Self-pay | Admitting: Diagnostic Neuroimaging

## 2016-08-25 ENCOUNTER — Ambulatory Visit (INDEPENDENT_AMBULATORY_CARE_PROVIDER_SITE_OTHER): Payer: PPO | Admitting: Diagnostic Neuroimaging

## 2016-08-25 VITALS — BP 119/72 | HR 89 | Ht 62.0 in | Wt 110.6 lb

## 2016-08-25 DIAGNOSIS — G459 Transient cerebral ischemic attack, unspecified: Secondary | ICD-10-CM | POA: Diagnosis not present

## 2016-08-25 DIAGNOSIS — R29898 Other symptoms and signs involving the musculoskeletal system: Secondary | ICD-10-CM

## 2016-08-25 DIAGNOSIS — R200 Anesthesia of skin: Secondary | ICD-10-CM

## 2016-08-25 NOTE — Patient Instructions (Signed)
Thank you for coming to see [place] at [facility]. I hope we have been able to provide you high quality care today.  You may receive a patient satisfaction survey over the next few weeks. We would appreciate your feedback and comments so that we may continue to improve ourselves and the health of our patients.  - I will check ultrasound of neck and heart  - I will check lab testing  - start aspirin 25m daily  - stop smoking   ~~~~~~~~~~~~~~~~~~~~~~~~~~~~~~~~~~~~~~~~~~~~~~~~~~~~~~~~~~~~~~~~~  DR. PENUMALLI'S GUIDE TO HAPPY AND HEALTHY LIVING These are some of my general health and wellness recommendations. Some of them may apply to you better than others. Please use common sense as you try these suggestions and feel free to ask me any questions.   ACTIVITY/FITNESS Mental, social, emotional and physical stimulation are very important for brain and body health. Try learning a new activity (arts, music, language, sports, games).  Keep moving your body to the best of your abilities. You can do this at home, inside or outside, the park, community center, gym or anywhere you like. Consider a physical therapist or personal trainer to get started. Consider the app Sworkit. Fitness trackers such as smart-watches, smart-phones or Fitbits can help as well.   NUTRITION Eat more plants: colorful vegetables, nuts, seeds and berries.  Eat less sugar, salt, preservatives and processed foods.  Avoid toxins such as cigarettes and alcohol.  Drink water when you are thirsty. Warm water with a slice of lemon is an excellent morning drink to start the day.  Consider these websites for more information The Nutrition Source (hhttps://www.henry-hernandez.biz/ [employer] Nutrition (wWindowBlog.ch   RELAXATION Consider practicing mindfulness meditation or other relaxation techniques such as deep breathing, prayer, yoga, tai chi, massage. See  website mindful.org or the apps Headspace or Calm to help get started.   SLEEP Try to get at least 7-8+ hours sleep per day. Regular exercise and reduced caffeine will help you sleep better. Practice good sleep hygeine techniques. See website sleep.org for more information.   PLANNING Prepare estate planning, living will, healthcare POA documents. Sometimes this is best planned with the help of an attorney. Theconversationproject.org and agingwithdignity.org are excellent resources.

## 2016-08-25 NOTE — Progress Notes (Signed)
[facility]  PATIENT: Sheryl Campbell DOB: February 18, 1961  REFERRING CLINICIAN: Doristine Section HISTORY FROM: patient  REASON FOR VISIT: new consult    HISTORICAL  CHIEF COMPLAINT:  Chief Complaint  Patient presents with  . Paresthesia    rm 7, New Pt     HISTORY OF PRESENT ILLNESS:   56 year old female here for evaluation of numbness and tingling in hands. Patient has at least 5-10 years of bilateral hand numbness. Symptoms fluctuate. She also has arm pain, neck pain, which has been going on for several years.  Approximately one month ago patient was at the grocery store when she noticed her right arm felt slightly weak. Patient drove home and felt increasing weakness in her bilateral fourth digits. Symptoms lasted for several hours and then began to resolve.  Patient also has history of Bell's palsy for many years ago with residual left-sided weakness.  Patient also has history of cervical discectomy, C5, C6, C7 in 2004.  Patient has history of scoliosis, status post Harrington rod placement from T3-T11 in 2005.  Patient also has depression, anxiety, fibromyalgia, osteopenia and degenerative arthritis.     REVIEW OF SYSTEMS: Full 14 system review of systems performed and negative with exception of: Memory loss headache numbness weakness difficulty swallowing insomnia snoring tremor depression anxiety joint pain cramps achy muscles dizziness diarrhea cough wheezing snoring weight loss fatigue allergies.  ALLERGIES: No Known Allergies  HOME MEDICATIONS: No outpatient prescriptions prior to visit.   No facility-administered medications prior to visit.     PAST MEDICAL HISTORY: Past Medical History:  Diagnosis Date  . Arthritis   . Bell's palsy   . Cancer ([place])    skin  . Fibromyalgia   . Intervertebral disk disease    "my whole spine"  . Osteopenia   . Sciatica   . Scoliosis     PAST SURGICAL HISTORY: Past Surgical History:  Procedure Laterality  Date  . BACK SURGERY  05/2004   Harrington rods T3- T11  . BUNIONECTOMY Bilateral 07/2001  . CHOLECYSTECTOMY  02/2000  . NECK SURGERY  10/2002   cervical diskectomy, C5-C7  . NOSE SURGERY  07/1993   deviated septum    FAMILY HISTORY: Family History  Problem Relation Age of Onset  . Cancer - Lung Mother   . Diabetes Sister   . Diabetes Brother     SOCIAL HISTORY:  Social History   Social History  . Marital status: Married    Spouse name: Fritz Pickerel  . Number of children: 0  . Years of education: 12   Occupational History  .      disabled   Social History Main Topics  . Smoking status: Current Every Day Smoker    Packs/day: 1.00  . Smokeless tobacco: Never Used  . Alcohol use No     Comment: rare  . Drug use: No  . Sexual activity: Not on file   Other Topics Concern  . Not on file   Social History Narrative   Lives with husband   Caffeine- coffee, 2-3 cups daily; sodas 1 daily     PHYSICAL EXAM  GENERAL EXAM/CONSTITUTIONAL: Vitals:  Vitals:   08/25/16 1451  BP: 119/72  Pulse: 89  Weight: 110 lb 9.6 oz (50.2 kg)  Height: 5\' 2"  (1.575 m)     Body mass index is 20.23 kg/m.  Visual Acuity Screening   Right eye Left eye Both eyes  Without correction: 20/50 20/30   With correction:  Comments: Wears glasses for distance; eyes tested for close up    Patient is in no distress; well developed, nourished and groomed; neck is supple  ARTHRITIS CHANGES IN FINGERS (DIP JOINTS)  CARDIOVASCULAR:  Examination of carotid arteries is normal; no carotid bruits  Regular rate and rhythm, no murmurs  Examination of peripheral vascular system by observation and palpation is normal  EYES:  Ophthalmoscopic exam of optic discs and posterior segments is normal; no papilledema or hemorrhages  MUSCULOSKELETAL:  Gait, strength, tone, movements noted in Neurologic exam below  NEUROLOGIC: MENTAL STATUS:  No flowsheet data found.  awake, alert, oriented to  person, place and time  recent and remote memory intact  normal attention and concentration  language fluent, comprehension intact, naming intact,   fund of knowledge appropriate  CRANIAL NERVE:   2nd - no papilledema on fundoscopic exam  2nd, 3rd, 4th, 6th - pupils equal and reactive to light, visual fields full to confrontation, extraocular muscles intact, no nystagmus  5th - facial sensation symmetric  7th - facial strength --> DECR LEFT UPPER AND LOWER FACIAL STRENGTH WITH SYNKINESIS  8th - hearing intact  9th - palate elevates symmetrically, uvula midline  11th - shoulder shrug symmetric  12th - tongue protrusion midline  MOTOR:   normal bulk and tone, full strength in the BUE, BLE  SENSORY:   normal and symmetric to light touch, pinprick, temperature, vibration  EXCEPT SLIGHTLY DEC TEMP IN FEET  NEG TINELS AT WRISTS; NEG PHALEN'S  COORDINATION:   finger-nose-finger, fine finger movements normal  REFLEXES:   deep tendon reflexes --> BUE 2, KNEES 2, ANKLES 1  GAIT/STATION:   narrow based gait    DIAGNOSTIC DATA (LABS, IMAGING, TESTING) - I reviewed patient records, labs, notes, testing and imaging myself where available.  Lab Results  Component Value Date   WBC 8.7 07/30/2016   HGB 12.1 07/30/2016   HCT 37.0 07/30/2016   MCV 92.3 07/30/2016   PLT 267 07/30/2016      Component Value Date/Time   NA 139 07/30/2016 0816   K 4.0 07/30/2016 0816   CL 102 07/30/2016 0816   CO2 28 07/30/2016 0816   GLUCOSE 90 07/30/2016 0816   BUN 12 07/30/2016 0816   CREATININE 0.45 07/30/2016 0816   CALCIUM 9.2 07/30/2016 0816   GFRNONAA >60 07/30/2016 0816   GFRAA >60 07/30/2016 0816   No results found for: CHOL, HDL, LDLCALC, LDLDIRECT, TRIG, CHOLHDL No results found for: HGBA1C No results found for: VITAMINB12 No results found for: TSH   06/10/14 MRI cervical [I reviewed images myself and agree with interpretation. -VRP]  1. Cervical spondylosis,  degenerative disc disease, and congenitally short pedicles cause prominent impingement at C4-5 ; moderate impingement at C2-3; and mild impingement at C3-4 and C5-6, as detailed above. The central impingement at C4-5 is mildly worsened in the antrum.  2. Anterior plate and screw fixation at C5-C6-C7 with solid interbody fusion at C5-6.  05/03/15 MRI lumbar  - Curvature convex to the left with the apex at L2-3. Disc degeneration more pronounced on the right with endplate osteophytes and bulging of the disc. Right lateral recess and foraminal narrowing that could be symptomatic. Discogenic marrow changes, improved since the study 2014, but still possibly associated with back pain. - Mild left lateral recess and foraminal narrowing at L4-5 without visible neural compression.  07/30/16 MRI brain [I reviewed images myself and agree with interpretation. -VRP]  - No brain abnormality seen. - Mastoid effusion on  the right.     ASSESSMENT AND PLAN  56 y.o. year old female here with Transient right arm weakness for 20 minutes, which may represent TIA. Also with intermittent bilateral hand numbness and tingling, which may related to cervical spine disease.   Dx: TIA vs cervical radiculopathy  1. Right arm weakness   2. Bilateral hand numbness      PLAN:  TIA evaluation / workup (transient right arm weakness x 20 minutes) - carotid u/s, TTE, BP eval, diabetes eval, lipid eval, smoking cessation) - start aspirin 81mg  daily - check lipid panel and A1c - stop smoking  Cervical spine disease  / radiculopathy (intermittent numbness in hands) - follow up with Dr. Patrice Paradise re: cervical spine evaluation and history of surgery  Peripheral neuropathy (median vs ulnar neuropathies; intermittent numbness in hands) - check B12 level - could consider EMG/NCS; but will hold off for now due to mild symptoms, unremarkable neuro exam and patient  reluctance to pursue surgical treatments  Orders Placed This  Encounter  Procedures  . US Carotid Bilateral  . Lipid Panel  . Hemoglobin A1c  . Vitamin B12  . ECHOCARDIOGRAM COMPLETE   Return in about 4 months (around 12/23/2016).  I reviewed images, labs, notes, records myself. I summarized findings and reviewed with patient, for this high risk condition (TIA ) requiring high complexity decision making.    Penni Bombard, MD [id number], [id number] PM Certified in Neurology, Neurophysiology and Neuroimaging  [facility] [address], [place], [place] [address] [phone]

## 2016-08-26 LAB — LIPID PANEL
Chol/HDL Ratio: 3.1 ratio units (ref 0.0–4.4)
Cholesterol, Total: 160 mg/dL (ref 100–199)
HDL: 52 mg/dL (ref >39)
LDL Calculated: 77 mg/dL (ref 0–99)
TRIGLYCERIDES: 156 mg/dL — AB (ref 0–149)
VLDL Cholesterol Cal: 31 mg/dL (ref 5–40)

## 2016-08-26 LAB — HEMOGLOBIN A1C
Est. average glucose Bld gHb Est-mCnc: 105 mg/dL
Hgb A1c MFr Bld: 5.3 % (ref 4.8–5.6)

## 2016-08-26 LAB — VITAMIN B12: Vitamin B-12: 1216 pg/mL (ref 232–1245)

## 2016-08-31 ENCOUNTER — Other Ambulatory Visit: Payer: Self-pay | Admitting: Pain Medicine

## 2016-08-31 DIAGNOSIS — M47812 Spondylosis without myelopathy or radiculopathy, cervical region: Secondary | ICD-10-CM | POA: Diagnosis not present

## 2016-09-01 ENCOUNTER — Telehealth: Payer: Self-pay | Admitting: Diagnostic Neuroimaging

## 2016-09-01 NOTE — Telephone Encounter (Signed)
Spoke to Patient scheduled Carotid Doppler.

## 2016-09-02 ENCOUNTER — Other Ambulatory Visit: Payer: PPO

## 2016-09-02 ENCOUNTER — Telehealth: Payer: Self-pay | Admitting: Diagnostic Neuroimaging

## 2016-09-02 ENCOUNTER — Telehealth: Payer: Self-pay | Admitting: *Deleted

## 2016-09-02 NOTE — Telephone Encounter (Signed)
Patient missed her Doppler apt. Today called her and left her a message asking her to call me back .

## 2016-09-02 NOTE — Telephone Encounter (Signed)
Spoke with patient and informed her that her Vit B12 and A1C are normal. Advised she has slight increase in triglycerides, otherwise her lipid panel is normal. Advised she follow up with her PCP re: lipid panel. She verbalized understanding, had no questions.

## 2016-09-02 NOTE — Telephone Encounter (Signed)
Patient calling to reschedule doppler.

## 2016-09-03 DIAGNOSIS — F39 Unspecified mood [affective] disorder: Secondary | ICD-10-CM | POA: Diagnosis not present

## 2016-09-08 ENCOUNTER — Other Ambulatory Visit (HOSPITAL_COMMUNITY): Payer: PPO

## 2016-09-08 NOTE — Telephone Encounter (Signed)
Called and left message for patient again about rescheduling her Doppler.

## 2016-09-10 ENCOUNTER — Ambulatory Visit (INDEPENDENT_AMBULATORY_CARE_PROVIDER_SITE_OTHER): Payer: PPO

## 2016-09-10 DIAGNOSIS — R200 Anesthesia of skin: Secondary | ICD-10-CM

## 2016-09-10 DIAGNOSIS — R29898 Other symptoms and signs involving the musculoskeletal system: Secondary | ICD-10-CM

## 2016-09-10 DIAGNOSIS — G459 Transient cerebral ischemic attack, unspecified: Secondary | ICD-10-CM

## 2016-09-17 ENCOUNTER — Telehealth: Payer: Self-pay | Admitting: *Deleted

## 2016-09-17 NOTE — Telephone Encounter (Signed)
See the scanned result as an attachment. No ICA stenosis. -VRP

## 2016-09-17 NOTE — Telephone Encounter (Signed)
Per Dr Leta Baptist, spoke with patient and informed her that her carotid US did not show any stenosis or narrowing of her arteries. She verbalized understanding, appreciation for call.

## 2016-09-22 ENCOUNTER — Other Ambulatory Visit: Payer: Self-pay

## 2016-09-22 ENCOUNTER — Ambulatory Visit (HOSPITAL_COMMUNITY): Payer: PPO | Attending: Cardiology

## 2016-09-22 DIAGNOSIS — R29898 Other symptoms and signs involving the musculoskeletal system: Secondary | ICD-10-CM | POA: Insufficient documentation

## 2016-09-22 DIAGNOSIS — G459 Transient cerebral ischemic attack, unspecified: Secondary | ICD-10-CM

## 2016-09-22 DIAGNOSIS — R200 Anesthesia of skin: Secondary | ICD-10-CM | POA: Diagnosis not present

## 2016-09-22 DIAGNOSIS — I361 Nonrheumatic tricuspid (valve) insufficiency: Secondary | ICD-10-CM | POA: Diagnosis not present

## 2016-09-23 ENCOUNTER — Telehealth: Payer: Self-pay | Admitting: *Deleted

## 2016-09-23 NOTE — Telephone Encounter (Signed)
LVM informing patient her echocardiogram results are unremarkable. Advised she continue to monitor her symptoms and call for any questions or needs prior to June FU. Left number.

## 2016-09-28 ENCOUNTER — Other Ambulatory Visit: Payer: Self-pay | Admitting: Pain Medicine

## 2016-10-26 DIAGNOSIS — M542 Cervicalgia: Secondary | ICD-10-CM | POA: Diagnosis not present

## 2016-10-26 DIAGNOSIS — M5137 Other intervertebral disc degeneration, lumbosacral region: Secondary | ICD-10-CM | POA: Diagnosis not present

## 2016-10-26 DIAGNOSIS — M5481 Occipital neuralgia: Secondary | ICD-10-CM | POA: Diagnosis not present

## 2016-10-26 DIAGNOSIS — M5136 Other intervertebral disc degeneration, lumbar region: Secondary | ICD-10-CM | POA: Diagnosis not present

## 2016-10-26 DIAGNOSIS — M5031 Other cervical disc degeneration,  high cervical region: Secondary | ICD-10-CM | POA: Diagnosis not present

## 2016-10-26 DIAGNOSIS — G894 Chronic pain syndrome: Secondary | ICD-10-CM | POA: Diagnosis not present

## 2016-10-26 DIAGNOSIS — Z79891 Long term (current) use of opiate analgesic: Secondary | ICD-10-CM | POA: Diagnosis not present

## 2016-10-26 DIAGNOSIS — M5033 Other cervical disc degeneration, cervicothoracic region: Secondary | ICD-10-CM | POA: Diagnosis not present

## 2016-11-16 DIAGNOSIS — F39 Unspecified mood [affective] disorder: Secondary | ICD-10-CM | POA: Diagnosis not present

## 2016-11-23 DIAGNOSIS — M545 Low back pain: Secondary | ICD-10-CM | POA: Diagnosis not present

## 2016-11-23 DIAGNOSIS — G894 Chronic pain syndrome: Secondary | ICD-10-CM | POA: Diagnosis not present

## 2016-11-23 DIAGNOSIS — M5136 Other intervertebral disc degeneration, lumbar region: Secondary | ICD-10-CM | POA: Diagnosis not present

## 2016-11-23 DIAGNOSIS — M5031 Other cervical disc degeneration,  high cervical region: Secondary | ICD-10-CM | POA: Diagnosis not present

## 2016-11-23 DIAGNOSIS — M5481 Occipital neuralgia: Secondary | ICD-10-CM | POA: Diagnosis not present

## 2016-11-23 DIAGNOSIS — Z79891 Long term (current) use of opiate analgesic: Secondary | ICD-10-CM | POA: Diagnosis not present

## 2016-11-23 DIAGNOSIS — M48061 Spinal stenosis, lumbar region without neurogenic claudication: Secondary | ICD-10-CM | POA: Diagnosis not present

## 2016-11-23 DIAGNOSIS — M542 Cervicalgia: Secondary | ICD-10-CM | POA: Diagnosis not present

## 2016-12-18 DIAGNOSIS — K30 Functional dyspepsia: Secondary | ICD-10-CM | POA: Diagnosis not present

## 2016-12-18 DIAGNOSIS — M25561 Pain in right knee: Secondary | ICD-10-CM | POA: Diagnosis not present

## 2016-12-21 DIAGNOSIS — M542 Cervicalgia: Secondary | ICD-10-CM | POA: Diagnosis not present

## 2016-12-21 DIAGNOSIS — Z79891 Long term (current) use of opiate analgesic: Secondary | ICD-10-CM | POA: Diagnosis not present

## 2016-12-21 DIAGNOSIS — M5481 Occipital neuralgia: Secondary | ICD-10-CM | POA: Diagnosis not present

## 2016-12-21 DIAGNOSIS — G894 Chronic pain syndrome: Secondary | ICD-10-CM | POA: Diagnosis not present

## 2016-12-21 DIAGNOSIS — M545 Low back pain: Secondary | ICD-10-CM | POA: Diagnosis not present

## 2016-12-21 DIAGNOSIS — M5031 Other cervical disc degeneration,  high cervical region: Secondary | ICD-10-CM | POA: Diagnosis not present

## 2016-12-21 DIAGNOSIS — G580 Intercostal neuropathy: Secondary | ICD-10-CM | POA: Diagnosis not present

## 2016-12-21 DIAGNOSIS — M5136 Other intervertebral disc degeneration, lumbar region: Secondary | ICD-10-CM | POA: Diagnosis not present

## 2016-12-28 ENCOUNTER — Ambulatory Visit: Payer: PPO | Admitting: Diagnostic Neuroimaging

## 2017-02-15 DIAGNOSIS — M545 Low back pain: Secondary | ICD-10-CM | POA: Diagnosis not present

## 2017-02-15 DIAGNOSIS — M5031 Other cervical disc degeneration,  high cervical region: Secondary | ICD-10-CM | POA: Diagnosis not present

## 2017-02-15 DIAGNOSIS — M5481 Occipital neuralgia: Secondary | ICD-10-CM | POA: Diagnosis not present

## 2017-02-15 DIAGNOSIS — G894 Chronic pain syndrome: Secondary | ICD-10-CM | POA: Diagnosis not present

## 2017-02-15 DIAGNOSIS — M542 Cervicalgia: Secondary | ICD-10-CM | POA: Diagnosis not present

## 2017-02-15 DIAGNOSIS — M5136 Other intervertebral disc degeneration, lumbar region: Secondary | ICD-10-CM | POA: Diagnosis not present

## 2017-02-15 DIAGNOSIS — Z79891 Long term (current) use of opiate analgesic: Secondary | ICD-10-CM | POA: Diagnosis not present

## 2017-02-15 DIAGNOSIS — M48061 Spinal stenosis, lumbar region without neurogenic claudication: Secondary | ICD-10-CM | POA: Diagnosis not present

## 2017-05-12 ENCOUNTER — Other Ambulatory Visit (HOSPITAL_COMMUNITY): Payer: Self-pay | Admitting: Family Medicine

## 2017-05-12 DIAGNOSIS — Z23 Encounter for immunization: Secondary | ICD-10-CM | POA: Diagnosis not present

## 2017-05-12 DIAGNOSIS — R1314 Dysphagia, pharyngoesophageal phase: Secondary | ICD-10-CM

## 2017-05-12 DIAGNOSIS — Z1211 Encounter for screening for malignant neoplasm of colon: Secondary | ICD-10-CM | POA: Diagnosis not present

## 2017-05-12 DIAGNOSIS — R1310 Dysphagia, unspecified: Secondary | ICD-10-CM | POA: Diagnosis not present

## 2017-05-13 ENCOUNTER — Ambulatory Visit
Admission: RE | Admit: 2017-05-13 | Discharge: 2017-05-13 | Disposition: A | Discharge status: Home / Self Care | Payer: PPO | Source: Ambulatory Visit | Attending: Family Medicine | Admitting: Family Medicine

## 2017-05-13 ENCOUNTER — Other Ambulatory Visit: Payer: Self-pay | Admitting: Family Medicine

## 2017-05-13 DIAGNOSIS — M19072 Primary osteoarthritis, left ankle and foot: Secondary | ICD-10-CM | POA: Diagnosis not present

## 2017-05-13 DIAGNOSIS — R52 Pain, unspecified: Secondary | ICD-10-CM

## 2017-05-17 DIAGNOSIS — Z5181 Encounter for therapeutic drug level monitoring: Secondary | ICD-10-CM | POA: Diagnosis not present

## 2017-05-17 DIAGNOSIS — M5136 Other intervertebral disc degeneration, lumbar region: Secondary | ICD-10-CM | POA: Diagnosis not present

## 2017-05-17 DIAGNOSIS — M542 Cervicalgia: Secondary | ICD-10-CM | POA: Diagnosis not present

## 2017-05-17 DIAGNOSIS — M5030 Other cervical disc degeneration, unspecified cervical region: Secondary | ICD-10-CM | POA: Diagnosis not present

## 2017-05-17 DIAGNOSIS — M546 Pain in thoracic spine: Secondary | ICD-10-CM | POA: Diagnosis not present

## 2017-05-18 ENCOUNTER — Ambulatory Visit (HOSPITAL_COMMUNITY)
Admission: RE | Admit: 2017-05-18 | Discharge: 2017-05-18 | Disposition: A | Discharge status: Home / Self Care | Payer: PPO | Source: Ambulatory Visit | Attending: Family Medicine | Admitting: Family Medicine

## 2017-05-18 DIAGNOSIS — K222 Esophageal obstruction: Secondary | ICD-10-CM | POA: Insufficient documentation

## 2017-05-18 DIAGNOSIS — K224 Dyskinesia of esophagus: Secondary | ICD-10-CM | POA: Diagnosis not present

## 2017-05-18 DIAGNOSIS — R1314 Dysphagia, pharyngoesophageal phase: Secondary | ICD-10-CM | POA: Diagnosis not present

## 2017-05-18 DIAGNOSIS — R1310 Dysphagia, unspecified: Secondary | ICD-10-CM | POA: Diagnosis not present

## 2017-05-18 DIAGNOSIS — K449 Diaphragmatic hernia without obstruction or gangrene: Secondary | ICD-10-CM | POA: Insufficient documentation

## 2017-06-08 DIAGNOSIS — M79675 Pain in left toe(s): Secondary | ICD-10-CM | POA: Diagnosis not present

## 2017-06-09 DIAGNOSIS — Z1211 Encounter for screening for malignant neoplasm of colon: Secondary | ICD-10-CM | POA: Diagnosis not present

## 2017-06-09 DIAGNOSIS — R933 Abnormal findings on diagnostic imaging of other parts of digestive tract: Secondary | ICD-10-CM | POA: Diagnosis not present

## 2017-06-09 DIAGNOSIS — K219 Gastro-esophageal reflux disease without esophagitis: Secondary | ICD-10-CM | POA: Diagnosis not present

## 2017-06-09 DIAGNOSIS — R1314 Dysphagia, pharyngoesophageal phase: Secondary | ICD-10-CM | POA: Diagnosis not present

## 2017-06-09 DIAGNOSIS — K222 Esophageal obstruction: Secondary | ICD-10-CM | POA: Diagnosis not present

## 2017-06-14 DIAGNOSIS — Z5181 Encounter for therapeutic drug level monitoring: Secondary | ICD-10-CM | POA: Diagnosis not present

## 2017-06-14 DIAGNOSIS — M5030 Other cervical disc degeneration, unspecified cervical region: Secondary | ICD-10-CM | POA: Diagnosis not present

## 2017-06-14 DIAGNOSIS — M5136 Other intervertebral disc degeneration, lumbar region: Secondary | ICD-10-CM | POA: Diagnosis not present

## 2017-06-14 DIAGNOSIS — M5134 Other intervertebral disc degeneration, thoracic region: Secondary | ICD-10-CM | POA: Diagnosis not present

## 2017-06-16 DIAGNOSIS — N760 Acute vaginitis: Secondary | ICD-10-CM | POA: Diagnosis not present

## 2017-06-16 DIAGNOSIS — Z1231 Encounter for screening mammogram for malignant neoplasm of breast: Secondary | ICD-10-CM | POA: Diagnosis not present

## 2017-06-16 DIAGNOSIS — Z01419 Encounter for gynecological examination (general) (routine) without abnormal findings: Secondary | ICD-10-CM | POA: Diagnosis not present

## 2017-06-16 DIAGNOSIS — N952 Postmenopausal atrophic vaginitis: Secondary | ICD-10-CM | POA: Diagnosis not present

## 2017-06-25 DIAGNOSIS — R933 Abnormal findings on diagnostic imaging of other parts of digestive tract: Secondary | ICD-10-CM | POA: Diagnosis not present

## 2017-06-25 DIAGNOSIS — K222 Esophageal obstruction: Secondary | ICD-10-CM | POA: Diagnosis not present

## 2017-06-25 DIAGNOSIS — R1314 Dysphagia, pharyngoesophageal phase: Secondary | ICD-10-CM | POA: Diagnosis not present

## 2017-07-15 DIAGNOSIS — M5030 Other cervical disc degeneration, unspecified cervical region: Secondary | ICD-10-CM | POA: Diagnosis not present

## 2017-07-15 DIAGNOSIS — M5134 Other intervertebral disc degeneration, thoracic region: Secondary | ICD-10-CM | POA: Diagnosis not present

## 2017-07-15 DIAGNOSIS — Z5181 Encounter for therapeutic drug level monitoring: Secondary | ICD-10-CM | POA: Diagnosis not present

## 2017-07-15 DIAGNOSIS — M5136 Other intervertebral disc degeneration, lumbar region: Secondary | ICD-10-CM | POA: Diagnosis not present

## 2017-07-28 DIAGNOSIS — K222 Esophageal obstruction: Secondary | ICD-10-CM | POA: Diagnosis not present

## 2017-07-28 DIAGNOSIS — Z1211 Encounter for screening for malignant neoplasm of colon: Secondary | ICD-10-CM | POA: Diagnosis not present

## 2017-07-28 DIAGNOSIS — K219 Gastro-esophageal reflux disease without esophagitis: Secondary | ICD-10-CM | POA: Diagnosis not present

## 2017-08-11 DIAGNOSIS — Z5181 Encounter for therapeutic drug level monitoring: Secondary | ICD-10-CM | POA: Diagnosis not present

## 2017-08-11 DIAGNOSIS — M5030 Other cervical disc degeneration, unspecified cervical region: Secondary | ICD-10-CM | POA: Diagnosis not present

## 2017-08-11 DIAGNOSIS — M5134 Other intervertebral disc degeneration, thoracic region: Secondary | ICD-10-CM | POA: Diagnosis not present

## 2017-08-11 DIAGNOSIS — M5136 Other intervertebral disc degeneration, lumbar region: Secondary | ICD-10-CM | POA: Diagnosis not present

## 2017-08-30 DIAGNOSIS — Z1211 Encounter for screening for malignant neoplasm of colon: Secondary | ICD-10-CM | POA: Diagnosis not present

## 2017-09-08 DIAGNOSIS — M5134 Other intervertebral disc degeneration, thoracic region: Secondary | ICD-10-CM | POA: Diagnosis not present

## 2017-09-08 DIAGNOSIS — M5030 Other cervical disc degeneration, unspecified cervical region: Secondary | ICD-10-CM | POA: Diagnosis not present

## 2017-09-08 DIAGNOSIS — Z5181 Encounter for therapeutic drug level monitoring: Secondary | ICD-10-CM | POA: Diagnosis not present

## 2017-09-08 DIAGNOSIS — M5136 Other intervertebral disc degeneration, lumbar region: Secondary | ICD-10-CM | POA: Diagnosis not present

## 2017-10-06 DIAGNOSIS — M5134 Other intervertebral disc degeneration, thoracic region: Secondary | ICD-10-CM | POA: Diagnosis not present

## 2017-10-06 DIAGNOSIS — Z5181 Encounter for therapeutic drug level monitoring: Secondary | ICD-10-CM | POA: Diagnosis not present

## 2017-10-06 DIAGNOSIS — M5136 Other intervertebral disc degeneration, lumbar region: Secondary | ICD-10-CM | POA: Diagnosis not present

## 2017-10-06 DIAGNOSIS — M5030 Other cervical disc degeneration, unspecified cervical region: Secondary | ICD-10-CM | POA: Diagnosis not present

## 2017-11-03 DIAGNOSIS — M5134 Other intervertebral disc degeneration, thoracic region: Secondary | ICD-10-CM | POA: Diagnosis not present

## 2017-11-03 DIAGNOSIS — M5030 Other cervical disc degeneration, unspecified cervical region: Secondary | ICD-10-CM | POA: Diagnosis not present

## 2017-11-03 DIAGNOSIS — Z5181 Encounter for therapeutic drug level monitoring: Secondary | ICD-10-CM | POA: Diagnosis not present

## 2017-11-03 DIAGNOSIS — M5136 Other intervertebral disc degeneration, lumbar region: Secondary | ICD-10-CM | POA: Diagnosis not present

## 2017-11-29 DIAGNOSIS — Z5181 Encounter for therapeutic drug level monitoring: Secondary | ICD-10-CM | POA: Diagnosis not present

## 2017-11-29 DIAGNOSIS — M5136 Other intervertebral disc degeneration, lumbar region: Secondary | ICD-10-CM | POA: Diagnosis not present

## 2017-11-29 DIAGNOSIS — M5134 Other intervertebral disc degeneration, thoracic region: Secondary | ICD-10-CM | POA: Diagnosis not present

## 2017-11-29 DIAGNOSIS — M5030 Other cervical disc degeneration, unspecified cervical region: Secondary | ICD-10-CM | POA: Diagnosis not present

## 2017-12-03 DIAGNOSIS — J069 Acute upper respiratory infection, unspecified: Secondary | ICD-10-CM | POA: Diagnosis not present

## 2017-12-27 DIAGNOSIS — M5136 Other intervertebral disc degeneration, lumbar region: Secondary | ICD-10-CM | POA: Diagnosis not present

## 2017-12-27 DIAGNOSIS — Z5181 Encounter for therapeutic drug level monitoring: Secondary | ICD-10-CM | POA: Diagnosis not present

## 2017-12-27 DIAGNOSIS — M5134 Other intervertebral disc degeneration, thoracic region: Secondary | ICD-10-CM | POA: Diagnosis not present

## 2017-12-27 DIAGNOSIS — M5030 Other cervical disc degeneration, unspecified cervical region: Secondary | ICD-10-CM | POA: Diagnosis not present

## 2018-01-24 DIAGNOSIS — M5030 Other cervical disc degeneration, unspecified cervical region: Secondary | ICD-10-CM | POA: Diagnosis not present

## 2018-01-24 DIAGNOSIS — Z5181 Encounter for therapeutic drug level monitoring: Secondary | ICD-10-CM | POA: Diagnosis not present

## 2018-01-24 DIAGNOSIS — M5134 Other intervertebral disc degeneration, thoracic region: Secondary | ICD-10-CM | POA: Diagnosis not present

## 2018-01-24 DIAGNOSIS — M5136 Other intervertebral disc degeneration, lumbar region: Secondary | ICD-10-CM | POA: Diagnosis not present

## 2018-02-21 DIAGNOSIS — M5136 Other intervertebral disc degeneration, lumbar region: Secondary | ICD-10-CM | POA: Diagnosis not present

## 2018-02-21 DIAGNOSIS — M5030 Other cervical disc degeneration, unspecified cervical region: Secondary | ICD-10-CM | POA: Diagnosis not present

## 2018-02-21 DIAGNOSIS — Z5181 Encounter for therapeutic drug level monitoring: Secondary | ICD-10-CM | POA: Diagnosis not present

## 2018-02-21 DIAGNOSIS — M5134 Other intervertebral disc degeneration, thoracic region: Secondary | ICD-10-CM | POA: Diagnosis not present

## 2018-03-22 DIAGNOSIS — M5030 Other cervical disc degeneration, unspecified cervical region: Secondary | ICD-10-CM | POA: Diagnosis not present

## 2018-03-22 DIAGNOSIS — M5136 Other intervertebral disc degeneration, lumbar region: Secondary | ICD-10-CM | POA: Diagnosis not present

## 2018-03-22 DIAGNOSIS — Z5181 Encounter for therapeutic drug level monitoring: Secondary | ICD-10-CM | POA: Diagnosis not present

## 2018-03-22 DIAGNOSIS — M5134 Other intervertebral disc degeneration, thoracic region: Secondary | ICD-10-CM | POA: Diagnosis not present

## 2018-04-18 DIAGNOSIS — M5136 Other intervertebral disc degeneration, lumbar region: Secondary | ICD-10-CM | POA: Diagnosis not present

## 2018-04-18 DIAGNOSIS — M5030 Other cervical disc degeneration, unspecified cervical region: Secondary | ICD-10-CM | POA: Diagnosis not present

## 2018-04-18 DIAGNOSIS — Z5181 Encounter for therapeutic drug level monitoring: Secondary | ICD-10-CM | POA: Diagnosis not present

## 2018-04-18 DIAGNOSIS — M5134 Other intervertebral disc degeneration, thoracic region: Secondary | ICD-10-CM | POA: Diagnosis not present

## 2018-05-16 DIAGNOSIS — M5030 Other cervical disc degeneration, unspecified cervical region: Secondary | ICD-10-CM | POA: Diagnosis not present

## 2018-05-16 DIAGNOSIS — M5134 Other intervertebral disc degeneration, thoracic region: Secondary | ICD-10-CM | POA: Diagnosis not present

## 2018-05-16 DIAGNOSIS — M5136 Other intervertebral disc degeneration, lumbar region: Secondary | ICD-10-CM | POA: Diagnosis not present

## 2018-05-16 DIAGNOSIS — Z5181 Encounter for therapeutic drug level monitoring: Secondary | ICD-10-CM | POA: Diagnosis not present

## 2018-06-21 DIAGNOSIS — M5136 Other intervertebral disc degeneration, lumbar region: Secondary | ICD-10-CM | POA: Diagnosis not present

## 2018-06-21 DIAGNOSIS — Z5181 Encounter for therapeutic drug level monitoring: Secondary | ICD-10-CM | POA: Diagnosis not present

## 2018-06-21 DIAGNOSIS — M5030 Other cervical disc degeneration, unspecified cervical region: Secondary | ICD-10-CM | POA: Diagnosis not present

## 2018-06-21 DIAGNOSIS — M5134 Other intervertebral disc degeneration, thoracic region: Secondary | ICD-10-CM | POA: Diagnosis not present

## 2018-07-11 DIAGNOSIS — Z5181 Encounter for therapeutic drug level monitoring: Secondary | ICD-10-CM | POA: Diagnosis not present

## 2018-07-11 DIAGNOSIS — M5136 Other intervertebral disc degeneration, lumbar region: Secondary | ICD-10-CM | POA: Diagnosis not present

## 2018-07-11 DIAGNOSIS — M5134 Other intervertebral disc degeneration, thoracic region: Secondary | ICD-10-CM | POA: Diagnosis not present

## 2018-07-11 DIAGNOSIS — M5030 Other cervical disc degeneration, unspecified cervical region: Secondary | ICD-10-CM | POA: Diagnosis not present

## 2018-08-02 DIAGNOSIS — Z23 Encounter for immunization: Secondary | ICD-10-CM | POA: Diagnosis not present

## 2018-08-02 DIAGNOSIS — D649 Anemia, unspecified: Secondary | ICD-10-CM | POA: Diagnosis not present

## 2018-08-02 DIAGNOSIS — F17200 Nicotine dependence, unspecified, uncomplicated: Secondary | ICD-10-CM | POA: Diagnosis not present

## 2018-08-02 DIAGNOSIS — I10 Essential (primary) hypertension: Secondary | ICD-10-CM | POA: Diagnosis not present

## 2018-08-09 DIAGNOSIS — M5134 Other intervertebral disc degeneration, thoracic region: Secondary | ICD-10-CM | POA: Diagnosis not present

## 2018-08-09 DIAGNOSIS — Z5181 Encounter for therapeutic drug level monitoring: Secondary | ICD-10-CM | POA: Diagnosis not present

## 2018-08-09 DIAGNOSIS — M5030 Other cervical disc degeneration, unspecified cervical region: Secondary | ICD-10-CM | POA: Diagnosis not present

## 2018-08-09 DIAGNOSIS — M5136 Other intervertebral disc degeneration, lumbar region: Secondary | ICD-10-CM | POA: Diagnosis not present

## 2018-09-06 DIAGNOSIS — G894 Chronic pain syndrome: Secondary | ICD-10-CM | POA: Diagnosis not present

## 2018-09-06 DIAGNOSIS — M545 Low back pain: Secondary | ICD-10-CM | POA: Diagnosis not present

## 2018-09-06 DIAGNOSIS — M546 Pain in thoracic spine: Secondary | ICD-10-CM | POA: Diagnosis not present

## 2018-10-04 DIAGNOSIS — Z5181 Encounter for therapeutic drug level monitoring: Secondary | ICD-10-CM | POA: Diagnosis not present

## 2018-10-04 DIAGNOSIS — M5134 Other intervertebral disc degeneration, thoracic region: Secondary | ICD-10-CM | POA: Diagnosis not present

## 2018-10-04 DIAGNOSIS — M5136 Other intervertebral disc degeneration, lumbar region: Secondary | ICD-10-CM | POA: Diagnosis not present

## 2018-10-04 DIAGNOSIS — M5030 Other cervical disc degeneration, unspecified cervical region: Secondary | ICD-10-CM | POA: Diagnosis not present

## 2018-11-01 DIAGNOSIS — E1169 Type 2 diabetes mellitus with other specified complication: Secondary | ICD-10-CM | POA: Diagnosis not present

## 2018-11-01 DIAGNOSIS — M5136 Other intervertebral disc degeneration, lumbar region: Secondary | ICD-10-CM | POA: Diagnosis not present

## 2018-11-01 DIAGNOSIS — M5134 Other intervertebral disc degeneration, thoracic region: Secondary | ICD-10-CM | POA: Diagnosis not present

## 2018-11-01 DIAGNOSIS — L97529 Non-pressure chronic ulcer of other part of left foot with unspecified severity: Secondary | ICD-10-CM | POA: Diagnosis not present

## 2018-11-01 DIAGNOSIS — M869 Osteomyelitis, unspecified: Secondary | ICD-10-CM | POA: Diagnosis not present

## 2018-11-01 DIAGNOSIS — Z5181 Encounter for therapeutic drug level monitoring: Secondary | ICD-10-CM | POA: Diagnosis not present

## 2018-11-01 DIAGNOSIS — E11621 Type 2 diabetes mellitus with foot ulcer: Secondary | ICD-10-CM | POA: Diagnosis not present

## 2018-11-01 DIAGNOSIS — M5030 Other cervical disc degeneration, unspecified cervical region: Secondary | ICD-10-CM | POA: Diagnosis not present

## 2018-11-29 DIAGNOSIS — M48062 Spinal stenosis, lumbar region with neurogenic claudication: Secondary | ICD-10-CM | POA: Diagnosis not present

## 2018-11-29 DIAGNOSIS — M79609 Pain in unspecified limb: Secondary | ICD-10-CM | POA: Diagnosis not present

## 2018-11-29 DIAGNOSIS — R51 Headache: Secondary | ICD-10-CM | POA: Diagnosis not present

## 2018-11-29 DIAGNOSIS — M9951 Intervertebral disc stenosis of neural canal of cervical region: Secondary | ICD-10-CM | POA: Diagnosis not present

## 2018-11-29 DIAGNOSIS — G8929 Other chronic pain: Secondary | ICD-10-CM | POA: Diagnosis not present

## 2018-11-29 DIAGNOSIS — M47897 Other spondylosis, lumbosacral region: Secondary | ICD-10-CM | POA: Diagnosis not present

## 2018-11-29 DIAGNOSIS — M9931 Osseous stenosis of neural canal of cervical region: Secondary | ICD-10-CM | POA: Diagnosis not present

## 2018-11-29 DIAGNOSIS — M5134 Other intervertebral disc degeneration, thoracic region: Secondary | ICD-10-CM | POA: Diagnosis not present

## 2018-11-29 DIAGNOSIS — M5030 Other cervical disc degeneration, unspecified cervical region: Secondary | ICD-10-CM | POA: Diagnosis not present

## 2018-11-29 DIAGNOSIS — M5136 Other intervertebral disc degeneration, lumbar region: Secondary | ICD-10-CM | POA: Diagnosis not present

## 2018-11-29 DIAGNOSIS — M792 Neuralgia and neuritis, unspecified: Secondary | ICD-10-CM | POA: Diagnosis not present

## 2018-11-29 DIAGNOSIS — Z5181 Encounter for therapeutic drug level monitoring: Secondary | ICD-10-CM | POA: Diagnosis not present

## 2018-12-27 DIAGNOSIS — M5134 Other intervertebral disc degeneration, thoracic region: Secondary | ICD-10-CM | POA: Diagnosis not present

## 2018-12-27 DIAGNOSIS — Z5181 Encounter for therapeutic drug level monitoring: Secondary | ICD-10-CM | POA: Diagnosis not present

## 2018-12-27 DIAGNOSIS — M5030 Other cervical disc degeneration, unspecified cervical region: Secondary | ICD-10-CM | POA: Diagnosis not present

## 2018-12-27 DIAGNOSIS — M5136 Other intervertebral disc degeneration, lumbar region: Secondary | ICD-10-CM | POA: Diagnosis not present

## 2019-01-23 DIAGNOSIS — R5383 Other fatigue: Secondary | ICD-10-CM | POA: Diagnosis not present

## 2019-01-23 DIAGNOSIS — F17200 Nicotine dependence, unspecified, uncomplicated: Secondary | ICD-10-CM | POA: Diagnosis not present

## 2019-01-24 DIAGNOSIS — Z5181 Encounter for therapeutic drug level monitoring: Secondary | ICD-10-CM | POA: Diagnosis not present

## 2019-01-24 DIAGNOSIS — M5134 Other intervertebral disc degeneration, thoracic region: Secondary | ICD-10-CM | POA: Diagnosis not present

## 2019-01-24 DIAGNOSIS — M5136 Other intervertebral disc degeneration, lumbar region: Secondary | ICD-10-CM | POA: Diagnosis not present

## 2019-01-24 DIAGNOSIS — M5030 Other cervical disc degeneration, unspecified cervical region: Secondary | ICD-10-CM | POA: Diagnosis not present

## 2019-02-21 DIAGNOSIS — M792 Neuralgia and neuritis, unspecified: Secondary | ICD-10-CM | POA: Diagnosis not present

## 2019-02-21 DIAGNOSIS — M5030 Other cervical disc degeneration, unspecified cervical region: Secondary | ICD-10-CM | POA: Diagnosis not present

## 2019-02-21 DIAGNOSIS — M9931 Osseous stenosis of neural canal of cervical region: Secondary | ICD-10-CM | POA: Diagnosis not present

## 2019-02-21 DIAGNOSIS — M47897 Other spondylosis, lumbosacral region: Secondary | ICD-10-CM | POA: Diagnosis not present

## 2019-02-21 DIAGNOSIS — M9951 Intervertebral disc stenosis of neural canal of cervical region: Secondary | ICD-10-CM | POA: Diagnosis not present

## 2019-02-21 DIAGNOSIS — G894 Chronic pain syndrome: Secondary | ICD-10-CM | POA: Diagnosis not present

## 2019-02-21 DIAGNOSIS — M5136 Other intervertebral disc degeneration, lumbar region: Secondary | ICD-10-CM | POA: Diagnosis not present

## 2019-02-21 DIAGNOSIS — M5134 Other intervertebral disc degeneration, thoracic region: Secondary | ICD-10-CM | POA: Diagnosis not present

## 2019-02-21 DIAGNOSIS — M4807 Spinal stenosis, lumbosacral region: Secondary | ICD-10-CM | POA: Diagnosis not present

## 2019-02-21 DIAGNOSIS — Z5181 Encounter for therapeutic drug level monitoring: Secondary | ICD-10-CM | POA: Diagnosis not present

## 2019-02-21 DIAGNOSIS — G8929 Other chronic pain: Secondary | ICD-10-CM | POA: Diagnosis not present

## 2019-02-21 DIAGNOSIS — M48062 Spinal stenosis, lumbar region with neurogenic claudication: Secondary | ICD-10-CM | POA: Diagnosis not present

## 2019-02-21 DIAGNOSIS — M4726 Other spondylosis with radiculopathy, lumbar region: Secondary | ICD-10-CM | POA: Diagnosis not present

## 2019-02-28 DIAGNOSIS — M47817 Spondylosis without myelopathy or radiculopathy, lumbosacral region: Secondary | ICD-10-CM | POA: Diagnosis not present

## 2019-02-28 DIAGNOSIS — R05 Cough: Secondary | ICD-10-CM | POA: Diagnosis not present

## 2019-02-28 DIAGNOSIS — M5136 Other intervertebral disc degeneration, lumbar region: Secondary | ICD-10-CM | POA: Diagnosis not present

## 2019-02-28 DIAGNOSIS — M48061 Spinal stenosis, lumbar region without neurogenic claudication: Secondary | ICD-10-CM | POA: Diagnosis not present

## 2019-02-28 DIAGNOSIS — M5116 Intervertebral disc disorders with radiculopathy, lumbar region: Secondary | ICD-10-CM | POA: Diagnosis not present

## 2019-02-28 DIAGNOSIS — F17210 Nicotine dependence, cigarettes, uncomplicated: Secondary | ICD-10-CM | POA: Diagnosis not present

## 2019-02-28 DIAGNOSIS — M47816 Spondylosis without myelopathy or radiculopathy, lumbar region: Secondary | ICD-10-CM | POA: Diagnosis not present

## 2019-02-28 DIAGNOSIS — M419 Scoliosis, unspecified: Secondary | ICD-10-CM | POA: Diagnosis not present

## 2019-03-13 DIAGNOSIS — M5416 Radiculopathy, lumbar region: Secondary | ICD-10-CM | POA: Diagnosis not present

## 2019-03-21 DIAGNOSIS — G894 Chronic pain syndrome: Secondary | ICD-10-CM | POA: Diagnosis not present

## 2019-04-05 DIAGNOSIS — M5416 Radiculopathy, lumbar region: Secondary | ICD-10-CM | POA: Diagnosis not present

## 2019-04-05 DIAGNOSIS — G894 Chronic pain syndrome: Secondary | ICD-10-CM | POA: Diagnosis not present

## 2019-04-05 DIAGNOSIS — M545 Low back pain: Secondary | ICD-10-CM | POA: Diagnosis not present

## 2019-04-10 ENCOUNTER — Other Ambulatory Visit: Payer: Self-pay | Admitting: Orthopaedic Surgery

## 2019-04-10 DIAGNOSIS — M545 Low back pain, unspecified: Secondary | ICD-10-CM

## 2019-04-12 DIAGNOSIS — Z5181 Encounter for therapeutic drug level monitoring: Secondary | ICD-10-CM | POA: Diagnosis not present

## 2019-04-12 DIAGNOSIS — M5030 Other cervical disc degeneration, unspecified cervical region: Secondary | ICD-10-CM | POA: Diagnosis not present

## 2019-04-12 DIAGNOSIS — M5134 Other intervertebral disc degeneration, thoracic region: Secondary | ICD-10-CM | POA: Diagnosis not present

## 2019-04-12 DIAGNOSIS — M5136 Other intervertebral disc degeneration, lumbar region: Secondary | ICD-10-CM | POA: Diagnosis not present

## 2019-04-13 ENCOUNTER — Other Ambulatory Visit: Payer: Self-pay

## 2019-04-13 ENCOUNTER — Ambulatory Visit
Admission: RE | Admit: 2019-04-13 | Discharge: 2019-04-13 | Disposition: A | Discharge status: Home / Self Care | Payer: PPO | Source: Ambulatory Visit | Attending: Orthopaedic Surgery | Admitting: Orthopaedic Surgery

## 2019-04-13 DIAGNOSIS — M545 Low back pain, unspecified: Secondary | ICD-10-CM

## 2019-04-13 DIAGNOSIS — M48061 Spinal stenosis, lumbar region without neurogenic claudication: Secondary | ICD-10-CM | POA: Diagnosis not present

## 2019-04-17 DIAGNOSIS — M5134 Other intervertebral disc degeneration, thoracic region: Secondary | ICD-10-CM | POA: Diagnosis not present

## 2019-04-17 DIAGNOSIS — M5136 Other intervertebral disc degeneration, lumbar region: Secondary | ICD-10-CM | POA: Diagnosis not present

## 2019-04-17 DIAGNOSIS — M5030 Other cervical disc degeneration, unspecified cervical region: Secondary | ICD-10-CM | POA: Diagnosis not present

## 2019-04-17 DIAGNOSIS — G894 Chronic pain syndrome: Secondary | ICD-10-CM | POA: Diagnosis not present

## 2019-04-21 DIAGNOSIS — S32040A Wedge compression fracture of fourth lumbar vertebra, initial encounter for closed fracture: Secondary | ICD-10-CM | POA: Diagnosis not present

## 2019-05-15 DIAGNOSIS — M5134 Other intervertebral disc degeneration, thoracic region: Secondary | ICD-10-CM | POA: Diagnosis not present

## 2019-05-15 DIAGNOSIS — M5030 Other cervical disc degeneration, unspecified cervical region: Secondary | ICD-10-CM | POA: Diagnosis not present

## 2019-05-15 DIAGNOSIS — M5136 Other intervertebral disc degeneration, lumbar region: Secondary | ICD-10-CM | POA: Diagnosis not present

## 2019-05-15 DIAGNOSIS — G8929 Other chronic pain: Secondary | ICD-10-CM | POA: Diagnosis not present

## 2019-05-19 DIAGNOSIS — M40209 Unspecified kyphosis, site unspecified: Secondary | ICD-10-CM | POA: Diagnosis not present

## 2019-06-12 DIAGNOSIS — M5134 Other intervertebral disc degeneration, thoracic region: Secondary | ICD-10-CM | POA: Diagnosis not present

## 2019-06-12 DIAGNOSIS — M5136 Other intervertebral disc degeneration, lumbar region: Secondary | ICD-10-CM | POA: Diagnosis not present

## 2019-06-12 DIAGNOSIS — M5030 Other cervical disc degeneration, unspecified cervical region: Secondary | ICD-10-CM | POA: Diagnosis not present

## 2019-06-12 DIAGNOSIS — G8929 Other chronic pain: Secondary | ICD-10-CM | POA: Diagnosis not present

## 2019-06-12 DIAGNOSIS — G894 Chronic pain syndrome: Secondary | ICD-10-CM | POA: Diagnosis not present

## 2019-07-10 DIAGNOSIS — G894 Chronic pain syndrome: Secondary | ICD-10-CM | POA: Diagnosis not present

## 2019-07-10 DIAGNOSIS — M5030 Other cervical disc degeneration, unspecified cervical region: Secondary | ICD-10-CM | POA: Diagnosis not present

## 2019-07-10 DIAGNOSIS — M5136 Other intervertebral disc degeneration, lumbar region: Secondary | ICD-10-CM | POA: Diagnosis not present

## 2019-07-10 DIAGNOSIS — M5134 Other intervertebral disc degeneration, thoracic region: Secondary | ICD-10-CM | POA: Diagnosis not present

## 2019-08-15 DIAGNOSIS — M797 Fibromyalgia: Secondary | ICD-10-CM | POA: Diagnosis not present

## 2019-08-15 DIAGNOSIS — G894 Chronic pain syndrome: Secondary | ICD-10-CM | POA: Diagnosis not present

## 2019-08-15 DIAGNOSIS — M5136 Other intervertebral disc degeneration, lumbar region: Secondary | ICD-10-CM | POA: Diagnosis not present

## 2019-08-15 DIAGNOSIS — M1990 Unspecified osteoarthritis, unspecified site: Secondary | ICD-10-CM | POA: Diagnosis not present

## 2019-08-15 DIAGNOSIS — M179 Osteoarthritis of knee, unspecified: Secondary | ICD-10-CM | POA: Diagnosis not present

## 2019-08-15 DIAGNOSIS — M546 Pain in thoracic spine: Secondary | ICD-10-CM | POA: Diagnosis not present

## 2019-08-15 DIAGNOSIS — M419 Scoliosis, unspecified: Secondary | ICD-10-CM | POA: Diagnosis not present

## 2019-08-15 DIAGNOSIS — M5030 Other cervical disc degeneration, unspecified cervical region: Secondary | ICD-10-CM | POA: Diagnosis not present

## 2019-08-15 DIAGNOSIS — M545 Low back pain: Secondary | ICD-10-CM | POA: Diagnosis not present

## 2019-08-15 DIAGNOSIS — M5134 Other intervertebral disc degeneration, thoracic region: Secondary | ICD-10-CM | POA: Diagnosis not present

## 2019-08-15 DIAGNOSIS — M542 Cervicalgia: Secondary | ICD-10-CM | POA: Diagnosis not present

## 2019-09-12 DIAGNOSIS — M5030 Other cervical disc degeneration, unspecified cervical region: Secondary | ICD-10-CM | POA: Diagnosis not present

## 2019-09-12 DIAGNOSIS — R519 Headache, unspecified: Secondary | ICD-10-CM | POA: Diagnosis not present

## 2019-09-12 DIAGNOSIS — Z79891 Long term (current) use of opiate analgesic: Secondary | ICD-10-CM | POA: Diagnosis not present

## 2019-09-12 DIAGNOSIS — M792 Neuralgia and neuritis, unspecified: Secondary | ICD-10-CM | POA: Diagnosis not present

## 2019-09-12 DIAGNOSIS — M79609 Pain in unspecified limb: Secondary | ICD-10-CM | POA: Diagnosis not present

## 2019-09-12 DIAGNOSIS — M25519 Pain in unspecified shoulder: Secondary | ICD-10-CM | POA: Diagnosis not present

## 2019-09-12 DIAGNOSIS — Z5181 Encounter for therapeutic drug level monitoring: Secondary | ICD-10-CM | POA: Diagnosis not present

## 2019-09-12 DIAGNOSIS — M542 Cervicalgia: Secondary | ICD-10-CM | POA: Diagnosis not present

## 2019-09-12 DIAGNOSIS — G4700 Insomnia, unspecified: Secondary | ICD-10-CM | POA: Diagnosis not present

## 2019-09-12 DIAGNOSIS — M5136 Other intervertebral disc degeneration, lumbar region: Secondary | ICD-10-CM | POA: Diagnosis not present

## 2019-09-12 DIAGNOSIS — M5134 Other intervertebral disc degeneration, thoracic region: Secondary | ICD-10-CM | POA: Diagnosis not present

## 2019-09-12 DIAGNOSIS — M25559 Pain in unspecified hip: Secondary | ICD-10-CM | POA: Diagnosis not present

## 2019-09-12 DIAGNOSIS — G894 Chronic pain syndrome: Secondary | ICD-10-CM | POA: Diagnosis not present

## 2019-10-10 DIAGNOSIS — G894 Chronic pain syndrome: Secondary | ICD-10-CM | POA: Diagnosis not present

## 2019-10-10 DIAGNOSIS — M5136 Other intervertebral disc degeneration, lumbar region: Secondary | ICD-10-CM | POA: Diagnosis not present

## 2019-10-10 DIAGNOSIS — M179 Osteoarthritis of knee, unspecified: Secondary | ICD-10-CM | POA: Diagnosis not present

## 2019-10-10 DIAGNOSIS — M545 Low back pain: Secondary | ICD-10-CM | POA: Diagnosis not present

## 2019-11-02 DIAGNOSIS — R109 Unspecified abdominal pain: Secondary | ICD-10-CM | POA: Diagnosis not present

## 2019-11-02 DIAGNOSIS — M1990 Unspecified osteoarthritis, unspecified site: Secondary | ICD-10-CM | POA: Diagnosis not present

## 2019-11-02 DIAGNOSIS — E162 Hypoglycemia, unspecified: Secondary | ICD-10-CM | POA: Diagnosis not present

## 2019-11-02 DIAGNOSIS — R945 Abnormal results of liver function studies: Secondary | ICD-10-CM | POA: Diagnosis not present

## 2019-11-02 DIAGNOSIS — R748 Abnormal levels of other serum enzymes: Secondary | ICD-10-CM | POA: Diagnosis not present

## 2019-11-07 DIAGNOSIS — M5030 Other cervical disc degeneration, unspecified cervical region: Secondary | ICD-10-CM | POA: Diagnosis not present

## 2019-11-07 DIAGNOSIS — G894 Chronic pain syndrome: Secondary | ICD-10-CM | POA: Diagnosis not present

## 2019-11-07 DIAGNOSIS — M5134 Other intervertebral disc degeneration, thoracic region: Secondary | ICD-10-CM | POA: Diagnosis not present

## 2019-11-07 DIAGNOSIS — M5136 Other intervertebral disc degeneration, lumbar region: Secondary | ICD-10-CM | POA: Diagnosis not present

## 2019-11-09 ENCOUNTER — Other Ambulatory Visit: Payer: Self-pay | Admitting: Family Medicine

## 2019-11-09 DIAGNOSIS — E2839 Other primary ovarian failure: Secondary | ICD-10-CM

## 2019-12-05 DIAGNOSIS — M5030 Other cervical disc degeneration, unspecified cervical region: Secondary | ICD-10-CM | POA: Diagnosis not present

## 2019-12-05 DIAGNOSIS — M545 Low back pain: Secondary | ICD-10-CM | POA: Diagnosis not present

## 2019-12-05 DIAGNOSIS — M5136 Other intervertebral disc degeneration, lumbar region: Secondary | ICD-10-CM | POA: Diagnosis not present

## 2019-12-05 DIAGNOSIS — G894 Chronic pain syndrome: Secondary | ICD-10-CM | POA: Diagnosis not present

## 2019-12-13 ENCOUNTER — Encounter (HOSPITAL_COMMUNITY): Payer: Self-pay

## 2019-12-13 ENCOUNTER — Emergency Department (HOSPITAL_COMMUNITY): Payer: PPO

## 2019-12-13 ENCOUNTER — Other Ambulatory Visit: Payer: Self-pay

## 2019-12-13 ENCOUNTER — Emergency Department (HOSPITAL_COMMUNITY)
Admission: EM | Admit: 2019-12-13 | Discharge: 2019-12-13 | Disposition: A | Discharge status: Home / Self Care | Payer: PPO | Source: Home / Self Care | Attending: Emergency Medicine | Admitting: Emergency Medicine

## 2019-12-13 DIAGNOSIS — X501XXA Overexertion from prolonged static or awkward postures, initial encounter: Secondary | ICD-10-CM | POA: Diagnosis not present

## 2019-12-13 DIAGNOSIS — R05 Cough: Secondary | ICD-10-CM | POA: Diagnosis not present

## 2019-12-13 DIAGNOSIS — R29810 Facial weakness: Secondary | ICD-10-CM | POA: Diagnosis present

## 2019-12-13 DIAGNOSIS — R531 Weakness: Secondary | ICD-10-CM | POA: Diagnosis not present

## 2019-12-13 DIAGNOSIS — Y999 Unspecified external cause status: Secondary | ICD-10-CM | POA: Insufficient documentation

## 2019-12-13 DIAGNOSIS — W010XXA Fall on same level from slipping, tripping and stumbling without subsequent striking against object, initial encounter: Secondary | ICD-10-CM | POA: Insufficient documentation

## 2019-12-13 DIAGNOSIS — W19XXXA Unspecified fall, initial encounter: Secondary | ICD-10-CM | POA: Diagnosis not present

## 2019-12-13 DIAGNOSIS — I10 Essential (primary) hypertension: Secondary | ICD-10-CM | POA: Diagnosis not present

## 2019-12-13 DIAGNOSIS — S9304XA Dislocation of right ankle joint, initial encounter: Secondary | ICD-10-CM | POA: Diagnosis present

## 2019-12-13 DIAGNOSIS — A419 Sepsis, unspecified organism: Secondary | ICD-10-CM | POA: Diagnosis not present

## 2019-12-13 DIAGNOSIS — Z79891 Long term (current) use of opiate analgesic: Secondary | ICD-10-CM | POA: Diagnosis not present

## 2019-12-13 DIAGNOSIS — Z85828 Personal history of other malignant neoplasm of skin: Secondary | ICD-10-CM | POA: Diagnosis not present

## 2019-12-13 DIAGNOSIS — I361 Nonrheumatic tricuspid (valve) insufficiency: Secondary | ICD-10-CM | POA: Diagnosis not present

## 2019-12-13 DIAGNOSIS — M8580 Other specified disorders of bone density and structure, unspecified site: Secondary | ICD-10-CM | POA: Diagnosis present

## 2019-12-13 DIAGNOSIS — G253 Myoclonus: Secondary | ICD-10-CM | POA: Diagnosis present

## 2019-12-13 DIAGNOSIS — G8918 Other acute postprocedural pain: Secondary | ICD-10-CM | POA: Diagnosis not present

## 2019-12-13 DIAGNOSIS — R0602 Shortness of breath: Secondary | ICD-10-CM | POA: Diagnosis not present

## 2019-12-13 DIAGNOSIS — R52 Pain, unspecified: Secondary | ICD-10-CM

## 2019-12-13 DIAGNOSIS — S99921A Unspecified injury of right foot, initial encounter: Secondary | ICD-10-CM | POA: Diagnosis not present

## 2019-12-13 DIAGNOSIS — R7303 Prediabetes: Secondary | ICD-10-CM | POA: Diagnosis present

## 2019-12-13 DIAGNOSIS — S82831D Other fracture of upper and lower end of right fibula, subsequent encounter for closed fracture with routine healing: Secondary | ICD-10-CM | POA: Diagnosis not present

## 2019-12-13 DIAGNOSIS — Z9049 Acquired absence of other specified parts of digestive tract: Secondary | ICD-10-CM | POA: Diagnosis not present

## 2019-12-13 DIAGNOSIS — F17210 Nicotine dependence, cigarettes, uncomplicated: Secondary | ICD-10-CM | POA: Diagnosis present

## 2019-12-13 DIAGNOSIS — W06XXXA Fall from bed, initial encounter: Secondary | ICD-10-CM | POA: Diagnosis present

## 2019-12-13 DIAGNOSIS — S82851D Displaced trimalleolar fracture of right lower leg, subsequent encounter for closed fracture with routine healing: Secondary | ICD-10-CM | POA: Diagnosis not present

## 2019-12-13 DIAGNOSIS — J9601 Acute respiratory failure with hypoxia: Secondary | ICD-10-CM | POA: Diagnosis not present

## 2019-12-13 DIAGNOSIS — J189 Pneumonia, unspecified organism: Secondary | ICD-10-CM | POA: Diagnosis not present

## 2019-12-13 DIAGNOSIS — S82851A Displaced trimalleolar fracture of right lower leg, initial encounter for closed fracture: Secondary | ICD-10-CM | POA: Diagnosis present

## 2019-12-13 DIAGNOSIS — Y939 Activity, unspecified: Secondary | ICD-10-CM | POA: Insufficient documentation

## 2019-12-13 DIAGNOSIS — R000 Tachycardia, unspecified: Secondary | ICD-10-CM | POA: Diagnosis not present

## 2019-12-13 DIAGNOSIS — M797 Fibromyalgia: Secondary | ICD-10-CM | POA: Diagnosis present

## 2019-12-13 DIAGNOSIS — S82891D Other fracture of right lower leg, subsequent encounter for closed fracture with routine healing: Secondary | ICD-10-CM | POA: Diagnosis not present

## 2019-12-13 DIAGNOSIS — R0902 Hypoxemia: Secondary | ICD-10-CM | POA: Diagnosis not present

## 2019-12-13 DIAGNOSIS — Y92003 Bedroom of unspecified non-institutional (private) residence as the place of occurrence of the external cause: Secondary | ICD-10-CM | POA: Insufficient documentation

## 2019-12-13 DIAGNOSIS — Z91138 Patient's unintentional underdosing of medication regimen for other reason: Secondary | ICD-10-CM | POA: Diagnosis not present

## 2019-12-13 DIAGNOSIS — R4182 Altered mental status, unspecified: Secondary | ICD-10-CM | POA: Diagnosis present

## 2019-12-13 DIAGNOSIS — S82891A Other fracture of right lower leg, initial encounter for closed fracture: Secondary | ICD-10-CM

## 2019-12-13 DIAGNOSIS — Z20822 Contact with and (suspected) exposure to covid-19: Secondary | ICD-10-CM | POA: Diagnosis present

## 2019-12-13 DIAGNOSIS — Z79899 Other long term (current) drug therapy: Secondary | ICD-10-CM | POA: Diagnosis not present

## 2019-12-13 DIAGNOSIS — T43216A Underdosing of selective serotonin and norepinephrine reuptake inhibitors, initial encounter: Secondary | ICD-10-CM | POA: Diagnosis present

## 2019-12-13 MED ORDER — HYDROCODONE-ACETAMINOPHEN 5-325 MG PO TABS
1.0000 | ORAL_TABLET | Freq: Once | ORAL | Status: AC
  Administered 2019-12-13: 1 via ORAL
  Filled 2019-12-13: qty 1

## 2019-12-13 MED ORDER — PROPOFOL 10 MG/ML IV BOLUS
0.5000 mg/kg | Freq: Once | INTRAVENOUS | Status: AC
  Administered 2019-12-13: 26.1 mg via INTRAVENOUS
  Filled 2019-12-13: qty 20

## 2019-12-13 MED ORDER — KETAMINE HCL 50 MG/5ML IJ SOSY
0.5000 mg/kg | PREFILLED_SYRINGE | Freq: Once | INTRAMUSCULAR | Status: AC
  Administered 2019-12-13: 26 mg via INTRAVENOUS
  Filled 2019-12-13: qty 5

## 2019-12-13 NOTE — ED Provider Notes (Signed)
Face-to-face evaluation   History: She injured her right ankle this morning getting out of bed.  She is unsure what happened to cause her to twist her ankle.  She has been unable to bear weight since.  Physical exam: White female, alert cooperative.  Oral exam done for sedation purposes; no trismus, dentures, upper and lower, no oral lesions. Right ankle tender and swollen with swelling of the dorsal right foot.  Intact distal sensation and capillary refill.  No tenderness above the ankle.  .Sedation  Date/Time: 12/13/2019 2:47 PM Performed by: Daleen Bo, MD Authorized by: Daleen Bo, MD   Consent:    Consent obtained:  Written   Consent given by:  Patient and spouse   Risks discussed:  Inadequate sedation and prolonged hypoxia resulting in organ damage   Alternatives discussed:  Analgesia without sedation Universal protocol:    Immediately prior to procedure a time out was called: yes   Indications:    Procedure performed:  Fracture reduction   Procedure necessitating sedation performed by:  Physician performing sedation Pre-sedation assessment:    Time since last food or drink:  12 hours   ASA classification: class 2 - patient with mild systemic disease     Neck mobility: normal     Mouth opening:  2 finger widths   Mallampati score:  III - soft palate, base of uvula visible   Pre-sedation assessments completed and reviewed: airway patency, cardiovascular function, hydration status and mental status     Pre-sedation assessments completed and reviewed: pre-procedure nausea and vomiting status not reviewed and pre-procedure pain level not reviewed     Pre-sedation assessment completed:  12/13/2019 2:30 PM Immediate pre-procedure details:    Reassessment: Patient reassessed immediately prior to procedure     Reviewed: vital signs, relevant labs/tests and NPO status     Verified: bag valve mask available, emergency equipment available, intubation equipment available, IV  patency confirmed and oxygen available   Procedure details (see MAR for exact dosages):    Sedation:  Ketamine and propofol   Intended level of sedation: deep   Intra-procedure monitoring:  Blood pressure monitoring, cardiac monitor, continuous capnometry and continuous pulse oximetry   Intra-procedure events: none     Total Provider sedation time (minutes):  15 Post-procedure details:    Post-sedation assessment completed:  12/13/2019 2:49 PM   Attendance: Constant attendance by certified staff until patient recovered     Recovery: Patient returned to pre-procedure baseline     Post-sedation assessments completed and reviewed: airway patency, cardiovascular function, hydration status and mental status     Post-sedation assessments completed and reviewed: nausea/vomiting not reviewed and pain level not reviewed     Patient is stable for discharge or admission: yes     Patient tolerance:  Tolerated well, no immediate complications .Ortho Injury Treatment  Date/Time: 12/13/2019 2:50 PM Performed by: Daleen Bo, MD Authorized by: Daleen Bo, MD   Consent:    Consent obtained:  Written   Consent given by:  Patient   Risks discussed:  Recurrent dislocation and nerve damage   Alternatives discussed:  No treatment Universal protocol:    Immediately prior to procedure a time out was called: yes  Injury location: ankle Location details: right ankle Injury type: fracture-dislocation Fracture type: trimalleolar Pre-procedure neurovascular assessment: neurovascularly intact Pre-procedure distal perfusion: normal Pre-procedure neurological function: normal Pre-procedure range of motion: reduced  Anesthesia: Local anesthesia used: no  Patient sedated: Yes. Refer to sedation procedure documentation for details of sedation. Manipulation  performed: yes Skin traction used: no Skeletal traction used: no Reduction successful: yes X-ray confirmed reduction: yes Immobilization:  splint Supplies used: cotton padding and plaster Post-procedure neurovascular assessment: post-procedure neurovascularly intact Post-procedure distal perfusion: normal Post-procedure neurological function: normal Post-procedure range of motion: unchanged Patient tolerance: patient tolerated the procedure well with no immediate complications      Medical screening examination/treatment/procedure(s) were conducted as a shared visit with non-physician practitioner(s) and myself.  I personally evaluated the patient during the encounter    Daleen Bo, MD 12/15/19 [phone]

## 2019-12-13 NOTE — Consult Note (Signed)
Reason for Consult:Right ankle fx Referring Physician: [name] is an 59 y.o. female.  HPI: Sheryl Campbell was getting out of bed this morning and twisted her ankle. She had immediate pain and fell to the floor. She could not bear weight after that. She was brought to the ED where x-rays showed an ankle fx and orthopedic surgery was consulted. She c/o localized pain in the ankle.  Past Medical History:  Diagnosis Date  . Arthritis   . Bell's palsy   . Cancer ([place])    skin  . Fibromyalgia   . Intervertebral disk disease    "my whole spine"  . Osteopenia   . Sciatica   . Scoliosis     Past Surgical History:  Procedure Laterality Date  . BACK SURGERY  05/2004   Harrington rods T3- T11  . BUNIONECTOMY Bilateral 07/2001  . CHOLECYSTECTOMY  02/2000  . NECK SURGERY  10/2002   cervical diskectomy, C5-C7  . NOSE SURGERY  07/1993   deviated septum    Family History  Problem Relation Age of Onset  . Cancer - Lung Mother   . Diabetes Sister   . Diabetes Brother     Social History:  reports that she has been smoking. She has been smoking about 1.00 pack per day. She has never used smokeless tobacco. She reports that she does not drink alcohol or use drugs.  Allergies: No Known Allergies  Medications: I have reviewed the patient's current medications.  No results found for this or any previous visit (from the past 48 hour(s)).  DG Ankle Complete Right  Result Date: 12/13/2019 CLINICAL DATA:  Status post fall. Patient got out of bed this morning and twisted right ankle. EXAM: RIGHT ANKLE - COMPLETE 3+ VIEW COMPARISON:  None. FINDINGS: Complex fracture dislocation of the right ankle is identified. There are trimalleolar fractures with posterior dislocation of the tibiotalar joint. Marked diffuse soft tissue swelling. IMPRESSION: Complex fracture dislocation of the right ankle including trimalleolar fractures and posterior dislocation of the tibiotalar joint. Electronically  Signed   By: Kerby Moors M.D.   On: 12/13/2019 08:43   DG Foot Complete Right  Result Date: 12/13/2019 CLINICAL DATA:  Fall, injury EXAM: RIGHT FOOT COMPLETE - 3+ VIEW COMPARISON:  None. FINDINGS: No fracture or dislocation of the right foot. There is prior bunion correction osteotomy of the right first metatarsal. Mild associated first metatarsophalangeal arthrosis. Grossly displaced fracture dislocation of the ankle mortise better assessed by dedicated simultaneous ankle radiographs. Diffuse soft tissue edema. IMPRESSION: 1.  No fracture or dislocation of the right foot. 2.  Bunion correction osteotomy of the right first metatarsal. 3. Grossly displaced fracture dislocation of the ankle mortise; please see separately dictated dedicated ankle radiographs. Electronically Signed   By: Eddie Candle M.D.   On: 12/13/2019 08:43    Review of Systems  HENT: Negative for ear discharge, ear pain, hearing loss and tinnitus.   Eyes: Negative for photophobia and pain.  Respiratory: Negative for cough and shortness of breath.   Cardiovascular: Negative for chest pain.  Gastrointestinal: Negative for abdominal pain, nausea and vomiting.  Genitourinary: Negative for dysuria, flank pain, frequency and urgency.  Musculoskeletal: Positive for arthralgias (Right ankle). Negative for back pain, myalgias and neck pain.  Neurological: Negative for dizziness and headaches.  Hematological: Does not bruise/bleed easily.  Psychiatric/Behavioral: The patient is not nervous/anxious.    Blood pressure (!) 161/68, pulse 77, temperature 98.6 F (37 C), temperature source Oral, resp.  rate 16, height 5\' 2"  (1.575 m), weight 52.2 kg, SpO2 100 %. Physical Exam  Constitutional: She appears well-developed and well-nourished. No distress.  HENT:  Head: Normocephalic and atraumatic.  Eyes: Conjunctivae are normal. Right eye exhibits no discharge. Left eye exhibits no discharge. No scleral icterus.  Cardiovascular: Normal  rate and regular rhythm.  Respiratory: Effort normal. No respiratory distress.  Musculoskeletal:     Cervical back: Normal range of motion.     Comments: RLE No traumatic wounds, ecchymosis, or rash  Short leg splint in place  No knee effusion  Knee stable to varus/ valgus and anterior/posterior stress  Sens DPN, SPN, TN grossly intact  Motor EHL 5/5  Toes warm  Neurological: She is alert.  Skin: Skin is warm and dry. She is not diaphoretic.  Psychiatric: She has a normal mood and affect. Her behavior is normal.    Assessment/Plan: Right ankle fx -- Reduced well by EDP. She may discharge and see Dr. Marlou Sa in the office on Friday to discuss ORIF. NWB in meantime. Chronic back pain Nicotine use    Lisette Abu, PA-C Orthopedic Surgery [phone] 12/13/2019, 2:59 PM

## 2019-12-13 NOTE — ED Triage Notes (Signed)
Pt got out of bed this morning and twisted her right ankle, obvious deformity noted

## 2019-12-13 NOTE — Progress Notes (Signed)
Orthopedic Tech Progress Note Patient Details:  Sheryl Campbell [birth year] [id number]  Ortho Devices Type of Ortho Device: Crutches Ortho Device/Splint Location: RLE Ortho Device/Splint Interventions: Adjustment   Post Interventions Patient Tolerated: Ambulated well Instructions Provided: Poper ambulation with device, Adjustment of device   Sheryl Campbell Sheryl Campbell 12/13/2019, 4:30 PM

## 2019-12-13 NOTE — ED Notes (Signed)
Ortho called and room being prepared for conscious sedation.

## 2019-12-13 NOTE — Discharge Instructions (Addendum)
Please take your prescribed pain medication for pain.  Dr. Marlou Sa would like to see you in the office on Friday.  Please call the phone number provided today and schedule an appointment time on Friday.  Please make sure to attend this appointment for further management of your fracture.  Return to the ER if you have worsening pain, redness, swelling, fevers, chills, etc.

## 2019-12-13 NOTE — ED Provider Notes (Signed)
[facility] EMERGENCY DEPARTMENT Provider Note   CSN: [place]:[id number] Arrival date & time: 12/13/19  0750     History Chief Complaint  Patient presents with  . Ankle Injury    Sheryl Campbell is a 59 y.o. female.  HPI 59 year old female with a history of Bell's palsy, sciatica, fibromyalgia, osteopenia, skin cancer presents to the ER for right ankle pain.  Patient states that she got out of bed this morning and twisted her right ankle, and then subsequently fell to the ground.  Denies any head injury, LOC.  No dizziness, chest pain, shortness of breath, weakness at the time of the fall.  She was not able to walk after the fall.  She notes swelling and pain to her right ankle, denies any numbness, tingling, cold extremity, back pain.    Past Medical History:  Diagnosis Date  . Arthritis   . Bell's palsy   . Cancer ([place])    skin  . Fibromyalgia   . Intervertebral disk disease    "my whole spine"  . Osteopenia   . Sciatica   . Scoliosis     There are no problems to display for this patient.   Past Surgical History:  Procedure Laterality Date  . BACK SURGERY  05/2004   Harrington rods T3- T11  . BUNIONECTOMY Bilateral 07/2001  . CHOLECYSTECTOMY  02/2000  . NECK SURGERY  10/2002   cervical diskectomy, C5-C7  . NOSE SURGERY  07/1993   deviated septum     OB History   No obstetric history on file.     Family History  Problem Relation Age of Onset  . Cancer - Lung Mother   . Diabetes Sister   . Diabetes Brother     Social History   Tobacco Use  . Smoking status: Current Every Day Smoker    Packs/day: 1.00  . Smokeless tobacco: Never Used  Substance Use Topics  . Alcohol use: No    Comment: rare  . Drug use: No    Home Medications Prior to Admission medications   Medication Sig Start Date End Date Taking? Authorizing Provider  ARIPiprazole (ABILIFY) 2 MG tablet Take 2 mg by mouth daily.  08/19/16  Yes [provider]  baclofen  (LIORESAL) 10 MG tablet Take 1 tablet by mouth every 6 (six) hours as needed for pain. 12/07/19  Yes [provider]  diclofenac Sodium (VOLTAREN) 1 % GEL Apply 1 application topically 4 (four) times daily as needed for pain. 09/12/19  Yes [provider]  DULoxetine (CYMBALTA) 60 MG capsule Take 60 mg by mouth daily. 12/11/19  Yes [provider]  fentaNYL (DURAGESIC) 100 MCG/HR Place 1 patch onto the skin every 3 (three) days. 12/07/19  Yes [provider]  gabapentin (NEURONTIN) 300 MG capsule Take 300 mg by mouth 4 (four) times daily.  08/24/16  Yes [provider]  HYDROcodone-acetaminophen (NORCO) 10-325 MG tablet Take 1 tablet by mouth 2 (two) times daily as needed for moderate pain.  08/01/16  Yes [provider]  Multiple Vitamin (MULTIVITAMIN ADULT PO) Take 1 tablet by mouth daily.   Yes [provider]  POTASSIUM PO Take 1 tablet by mouth daily.   Yes [provider]  Turmeric (QC TUMERIC COMPLEX PO) Take 1 tablet by mouth daily.   Yes [provider]    Allergies    Patient has no known allergies.  Review of Systems   Review of Systems  Constitutional: Negative for  chills and fever.  Respiratory: Negative for cough and shortness of breath.   Cardiovascular: Negative for chest pain and palpitations.  Gastrointestinal: Negative for abdominal pain.  Genitourinary: Negative for dysuria.  Musculoskeletal: Positive for arthralgias, gait problem and joint swelling. Negative for back pain.  Skin: Negative for color change and rash.  Neurological: Negative for dizziness, seizures, syncope, weakness, numbness and headaches.  Psychiatric/Behavioral: Negative for confusion.  All other systems reviewed and are negative.   Physical Exam Updated Vital Signs BP (!) 157/72   Pulse 71   Temp 98.6 F (37 C) (Oral)   Resp 15   Ht 5\' 2"  (1.575 m)   Wt 52.2 kg   SpO2 100%   BMI 21.03 kg/m   Physical Exam Vitals  and nursing note reviewed.  Constitutional:      General: She is not in acute distress.    Appearance: Normal appearance. She is well-developed. She is not ill-appearing, toxic-appearing or diaphoretic.  HENT:     Head: Normocephalic and atraumatic.     Comments: No of hemotympanum, raccoon eyes, battle sign.  No mastoid tenderness.  No malocclusion.  No evidence of lacerations, cranial deformities.    Nose: Nose normal.     Mouth/Throat:     Mouth: Mucous membranes are moist.     Pharynx: Oropharynx is clear.  Eyes:     Extraocular Movements: Extraocular movements intact.     Conjunctiva/sclera: Conjunctivae normal.     Pupils: Pupils are equal, round, and reactive to light.  Cardiovascular:     Rate and Rhythm: Normal rate and regular rhythm.     Pulses: Normal pulses.     Heart sounds: Normal heart sounds. No murmur.  Pulmonary:     Effort: Pulmonary effort is normal. No respiratory distress.     Breath sounds: Normal breath sounds.  Abdominal:     Palpations: Abdomen is soft.     Tenderness: There is no abdominal tenderness.  Musculoskeletal:        General: Swelling, tenderness, deformity and signs of injury present.     Cervical back: Neck supple.     Right lower leg: Edema present.     Comments: Right ankle with significant swelling and mildly inverted.  2+ DP pulses, sensations intact.  Patient able to wiggle her toes.  Range of motion of ankle significantly restricted secondary to pain.  She is able to move the rest of her right lower extremity.  No midline tenderness to C, T, L-spine.  No evidence of open fracture.  Skin:    General: Skin is warm and dry.     Capillary Refill: Capillary refill takes less than 2 seconds.     Findings: Bruising present. No erythema or rash.  Neurological:     General: No focal deficit present.     Mental Status: She is alert and oriented to person, place, and time.     Sensory: No sensory deficit.     Motor: No weakness.  Psychiatric:         Mood and Affect: Mood normal.        Behavior: Behavior normal.      ED Results / Procedures / Treatments   Labs (all labs ordered are listed, but only abnormal results are displayed) Labs Reviewed - No data to display  EKG None  Radiology DG Ankle Complete Right  Result Date: 12/13/2019 CLINICAL DATA:  Status post fall. Patient got out of bed this morning and twisted right ankle. EXAM: RIGHT  ANKLE - COMPLETE 3+ VIEW COMPARISON:  None. FINDINGS: Complex fracture dislocation of the right ankle is identified. There are trimalleolar fractures with posterior dislocation of the tibiotalar joint. Marked diffuse soft tissue swelling. IMPRESSION: Complex fracture dislocation of the right ankle including trimalleolar fractures and posterior dislocation of the tibiotalar joint. Electronically Signed   By: Kerby Moors M.D.   On: 12/13/2019 08:43   DG Ankle Right Port  Result Date: 12/13/2019 CLINICAL DATA:  Status post reduction. EXAM: PORTABLE RIGHT ANKLE - 2 VIEW COMPARISON:  Earlier same day ankle radiographs. FINDINGS: Interval overlying casting material. Redemonstrated complex trimalleolar fracture. Interval reduction of the ankle joint. Overlying the calcaneus on the lateral view is in oval shaped density. IMPRESSION: Interval reduction of ankle dislocation. Redemonstrated trimalleolar fracture. New oval shaped density projecting over the calcaneus on lateral view may represent osseous fragment or overlying casting material. Electronically Signed   By: Lovey Newcomer M.D.   On: 12/13/2019 15:15   DG Foot Complete Right  Result Date: 12/13/2019 CLINICAL DATA:  Fall, injury EXAM: RIGHT FOOT COMPLETE - 3+ VIEW COMPARISON:  None. FINDINGS: No fracture or dislocation of the right foot. There is prior bunion correction osteotomy of the right first metatarsal. Mild associated first metatarsophalangeal arthrosis. Grossly displaced fracture dislocation of the ankle mortise better assessed by  dedicated simultaneous ankle radiographs. Diffuse soft tissue edema. IMPRESSION: 1.  No fracture or dislocation of the right foot. 2.  Bunion correction osteotomy of the right first metatarsal. 3. Grossly displaced fracture dislocation of the ankle mortise; please see separately dictated dedicated ankle radiographs. Electronically Signed   By: Eddie Candle M.D.   On: 12/13/2019 08:43    Procedures .Sedation  Date/Time: 12/13/2019 3:39 PM Performed by: Garald Balding, PA-C Authorized by: Garald Balding, PA-C   Consent:    Consent obtained:  Verbal   Consent given by:  Patient   Risks discussed:  Allergic reaction, dysrhythmia, inadequate sedation, nausea, prolonged hypoxia resulting in organ damage, prolonged sedation necessitating reversal, respiratory compromise necessitating ventilatory assistance and intubation and vomiting   Alternatives discussed:  Analgesia without sedation, anxiolysis and regional anesthesia Universal protocol:    Procedure explained and questions answered to patient or proxy's satisfaction: yes     Relevant documents present and verified: yes     Test results available and properly labeled: yes     Imaging studies available: yes     Required blood products, implants, devices, and special equipment available: yes     Site/side marked: yes     Immediately prior to procedure a time out was called: yes     Patient identity confirmation method:  Verbally with patient Indications:    Procedure performed:  Dislocation reduction   Procedure necessitating sedation performed by:  Physician performing sedation Pre-sedation assessment:    Time since last food or drink:  14 hours   ASA classification: class 1 - normal, healthy patient     Neck mobility: normal     Mouth opening:  3 or more finger widths   Thyromental distance:  4 finger widths   Mallampati score:  I - soft palate, uvula, fauces, pillars visible   Pre-sedation assessments completed and reviewed: airway  patency, cardiovascular function, hydration status, mental status, nausea/vomiting, pain level, respiratory function and temperature     Pre-sedation assessment completed:  12/13/2019 2:26 PM Immediate pre-procedure details:    Reassessment: Patient reassessed immediately prior to procedure     Reviewed: vital signs, relevant labs/tests and  NPO status     Verified: bag valve mask available, emergency equipment available, intubation equipment available, IV patency confirmed, oxygen available and suction available   Procedure details (see MAR for exact dosages):    Preoxygenation:  Nasal cannula   Sedation:  Propofol and ketamine   Intended level of sedation: deep   Analgesia:  Fentanyl   Intra-procedure monitoring:  Blood pressure monitoring, cardiac monitor, continuous pulse oximetry, frequent LOC assessments, frequent vital sign checks and continuous capnometry   Intra-procedure events: none     Total Provider sedation time (minutes):  5 Post-procedure details:    Post-sedation assessment completed:  12/13/2019 2:41 PM   Attendance: Constant attendance by certified staff until patient recovered     Recovery: Patient returned to pre-procedure baseline     Post-sedation assessments completed and reviewed: airway patency, cardiovascular function, hydration status, mental status, nausea/vomiting, pain level, respiratory function and temperature     Patient is stable for discharge or admission: yes     Patient tolerance:  Tolerated well, no immediate complications   (including critical care time)  Medications Ordered in ED Medications  HYDROcodone-acetaminophen (NORCO/VICODIN) 5-325 MG per tablet 1 tablet (1 tablet Oral Given 12/13/19 1148)  propofol (DIPRIVAN) 10 mg/mL bolus/IV push 26.1 mg (26.1 mg Intravenous Given 12/13/19 1444)  ketamine 50 mg in normal saline 5 mL (10 mg/mL) syringe (26 mg Intravenous Given 12/13/19 1444)    ED Course  I have reviewed the triage vital signs and the  nursing notes.  Pertinent labs & imaging results that were available during my care of the patient were reviewed by me and considered in my medical decision making (see chart for details).    MDM Rules/Calculators/A&P                     59 year old female with right ankle pain after twisting it as she was getting out of the bed. On presentation to the ER, the patient is alert and oriented, nontoxic-appearing, no acute distress.  Vital signs overall reassuring, she is slightly hypertensive throughout the ED course, though I suspect this is secondary to pain.  No signs of hypertensive urgency/emergency..  Plain films of right foot without abnormalities, but right ankle with complex fracture dislocation including trimalleolar fractures and posterior dislocation of the tibiotalar joint.  Sensations are intact and strong pulses in right foot.  No evidence of open fracture.  She is able to wiggle her toes, the right ankle range of motion is severely limited secondary to pain.  Patient received pain medication.  She is chronically on Norco 10-325 and fentanyl patches.  Awaiting consult from orthopedics.  Spoke with Hilbert Odor, PA-C, Dr. Eulis Foster and I reduced the ankle and placed the patient in a short arm splint with casting.  The patient tolerated the procedure well.  Postreduction films showed successful reduction of right ankle.  Hilbert Odor, PA-C arranged in appointment with Dr. Marlou Sa in the office for the patient on Friday 5/28.  Patient has a Norco prescription fentanyl.  I encouraged her to continue taking this medication for pain.  Informed the patient to call Dr. Randel Pigg office to schedule an appointment on Friday.  Return precautions given.  Patient voices understanding is agreeable to this plan.  At this stage in the ED course, the patient has been adequately screened and is stable for discharge.  The patient was seen and evaluated by Dr. Eulis Foster and is agreeable to the above plan.   Final  Clinical Impression(s) /  ED Diagnoses Final diagnoses:  Closed fracture of right ankle, initial encounter  Pain    Rx / DC Orders ED Discharge Orders    None       Garald Balding, PA-C 12/13/19 1545    Daleen Bo, MD 12/15/19 [phone]

## 2019-12-13 NOTE — Progress Notes (Signed)
RT called to patient room for conscious sedation procedure.  Patient's sats, vitals, and end-tidal CO2 remained within normal limits during procedure.  Patient tolerated well.

## 2019-12-13 NOTE — ED Notes (Signed)
Patient verbalizes understanding of discharge instructions. Opportunity for questioning and answers were provided. Armband removed by staff, pt discharged from ED via wheelchair w/ spouse

## 2019-12-13 NOTE — Progress Notes (Signed)
Orthopedic Tech Progress Note Patient Details:  Sheryl Campbell September 07, 1960 [id number]  Ortho Devices Type of Ortho Device: Short leg splint Ortho Device/Splint Location: RLE Ortho Device/Splint Interventions: Application   Post Interventions Patient Tolerated: Well Instructions Provided: Care of device   Romney Compean E Brityn Mastrogiovanni 12/13/2019, 2:49 PM

## 2019-12-15 ENCOUNTER — Inpatient Hospital Stay (HOSPITAL_COMMUNITY)
Admission: EM | Admit: 2019-12-15 | Discharge: 2019-12-19 | DRG: 492 | Disposition: A | Discharge status: Home / Self Care | Payer: PPO | Attending: Family Medicine | Admitting: Family Medicine

## 2019-12-15 ENCOUNTER — Emergency Department (HOSPITAL_COMMUNITY): Payer: PPO

## 2019-12-15 ENCOUNTER — Ambulatory Visit: Payer: PPO | Admitting: Orthopedic Surgery

## 2019-12-15 ENCOUNTER — Inpatient Hospital Stay (HOSPITAL_COMMUNITY): Payer: PPO

## 2019-12-15 DIAGNOSIS — R29810 Facial weakness: Secondary | ICD-10-CM | POA: Diagnosis present

## 2019-12-15 DIAGNOSIS — Z20822 Contact with and (suspected) exposure to covid-19: Secondary | ICD-10-CM | POA: Diagnosis present

## 2019-12-15 DIAGNOSIS — T43216A Underdosing of selective serotonin and norepinephrine reuptake inhibitors, initial encounter: Secondary | ICD-10-CM | POA: Diagnosis present

## 2019-12-15 DIAGNOSIS — M797 Fibromyalgia: Secondary | ICD-10-CM | POA: Diagnosis present

## 2019-12-15 DIAGNOSIS — F17210 Nicotine dependence, cigarettes, uncomplicated: Secondary | ICD-10-CM | POA: Diagnosis present

## 2019-12-15 DIAGNOSIS — S82851A Displaced trimalleolar fracture of right lower leg, initial encounter for closed fracture: Principal | ICD-10-CM | POA: Diagnosis present

## 2019-12-15 DIAGNOSIS — Z85828 Personal history of other malignant neoplasm of skin: Secondary | ICD-10-CM

## 2019-12-15 DIAGNOSIS — S82891D Other fracture of right lower leg, subsequent encounter for closed fracture with routine healing: Secondary | ICD-10-CM | POA: Diagnosis not present

## 2019-12-15 DIAGNOSIS — R4182 Altered mental status, unspecified: Secondary | ICD-10-CM | POA: Diagnosis present

## 2019-12-15 DIAGNOSIS — J9601 Acute respiratory failure with hypoxia: Secondary | ICD-10-CM | POA: Diagnosis not present

## 2019-12-15 DIAGNOSIS — S82831D Other fracture of upper and lower end of right fibula, subsequent encounter for closed fracture with routine healing: Secondary | ICD-10-CM | POA: Diagnosis not present

## 2019-12-15 DIAGNOSIS — M8580 Other specified disorders of bone density and structure, unspecified site: Secondary | ICD-10-CM | POA: Diagnosis present

## 2019-12-15 DIAGNOSIS — J189 Pneumonia, unspecified organism: Secondary | ICD-10-CM | POA: Diagnosis not present

## 2019-12-15 DIAGNOSIS — Z9049 Acquired absence of other specified parts of digestive tract: Secondary | ICD-10-CM | POA: Diagnosis not present

## 2019-12-15 DIAGNOSIS — Z79899 Other long term (current) drug therapy: Secondary | ICD-10-CM

## 2019-12-15 DIAGNOSIS — X501XXA Overexertion from prolonged static or awkward postures, initial encounter: Secondary | ICD-10-CM

## 2019-12-15 DIAGNOSIS — G253 Myoclonus: Secondary | ICD-10-CM | POA: Diagnosis present

## 2019-12-15 DIAGNOSIS — S9304XA Dislocation of right ankle joint, initial encounter: Secondary | ICD-10-CM | POA: Diagnosis present

## 2019-12-15 DIAGNOSIS — Z91138 Patient's unintentional underdosing of medication regimen for other reason: Secondary | ICD-10-CM

## 2019-12-15 DIAGNOSIS — W06XXXA Fall from bed, initial encounter: Secondary | ICD-10-CM | POA: Diagnosis present

## 2019-12-15 DIAGNOSIS — I361 Nonrheumatic tricuspid (valve) insufficiency: Secondary | ICD-10-CM | POA: Diagnosis not present

## 2019-12-15 DIAGNOSIS — S82891A Other fracture of right lower leg, initial encounter for closed fracture: Secondary | ICD-10-CM | POA: Diagnosis not present

## 2019-12-15 DIAGNOSIS — Z79891 Long term (current) use of opiate analgesic: Secondary | ICD-10-CM

## 2019-12-15 DIAGNOSIS — A419 Sepsis, unspecified organism: Secondary | ICD-10-CM | POA: Diagnosis not present

## 2019-12-15 DIAGNOSIS — Z419 Encounter for procedure for purposes other than remedying health state, unspecified: Secondary | ICD-10-CM

## 2019-12-15 DIAGNOSIS — R7303 Prediabetes: Secondary | ICD-10-CM | POA: Diagnosis present

## 2019-12-15 LAB — RAPID URINE DRUG SCREEN, HOSP PERFORMED
Amphetamines: NOT DETECTED
Barbiturates: NOT DETECTED
Benzodiazepines: NOT DETECTED
Cocaine: NOT DETECTED
Opiates: NOT DETECTED
Tetrahydrocannabinol: NOT DETECTED

## 2019-12-15 LAB — COMPREHENSIVE METABOLIC PANEL
ALT: 16 U/L (ref 0–44)
AST: 19 U/L (ref 15–41)
Albumin: 3.1 g/dL — ABNORMAL LOW (ref 3.5–5.0)
Alkaline Phosphatase: 79 U/L (ref 38–126)
Anion gap: 10 (ref 5–15)
BUN: 9 mg/dL (ref 6–20)
CO2: 26 mmol/L (ref 22–32)
Calcium: 8.7 mg/dL — ABNORMAL LOW (ref 8.9–10.3)
Chloride: 102 mmol/L (ref 98–111)
Creatinine, Ser: 0.53 mg/dL (ref 0.44–1.00)
GFR calc Af Amer: >60 mL/min (ref >60)
GFR calc non Af Amer: >60 mL/min (ref >60)
Glucose, Bld: 113 mg/dL — ABNORMAL HIGH (ref 70–99)
Potassium: 4.4 mmol/L (ref 3.5–5.1)
Sodium: 138 mmol/L (ref 135–145)
Total Bilirubin: 0.4 mg/dL (ref 0.3–1.2)
Total Protein: 7.6 g/dL (ref 6.5–8.1)

## 2019-12-15 LAB — CBC WITH DIFFERENTIAL/PLATELET
Abs Immature Granulocytes: 0.03 10*3/uL (ref 0.00–0.07)
Basophils Absolute: 0.1 10*3/uL (ref 0.0–0.1)
Basophils Relative: 1 %
Eosinophils Absolute: 0.1 10*3/uL (ref 0.0–0.5)
Eosinophils Relative: 1 %
HCT: 31.7 % — ABNORMAL LOW (ref 36.0–46.0)
Hemoglobin: 9.4 g/dL — ABNORMAL LOW (ref 12.0–15.0)
Immature Granulocytes: 0 %
Lymphocytes Relative: 16 %
Lymphs Abs: 1.7 10*3/uL (ref 0.7–4.0)
MCH: 25.5 pg — ABNORMAL LOW (ref 26.0–34.0)
MCHC: 29.7 g/dL — ABNORMAL LOW (ref 30.0–36.0)
MCV: 86.1 fL (ref 80.0–100.0)
Monocytes Absolute: 0.7 10*3/uL (ref 0.1–1.0)
Monocytes Relative: 7 %
Neutro Abs: 8.1 10*3/uL — ABNORMAL HIGH (ref 1.7–7.7)
Neutrophils Relative %: 75 %
Platelets: 382 10*3/uL (ref 150–400)
RBC: 3.68 MIL/uL — ABNORMAL LOW (ref 3.87–5.11)
RDW: 16.5 % — ABNORMAL HIGH (ref 11.5–15.5)
WBC: 10.6 10*3/uL — ABNORMAL HIGH (ref 4.0–10.5)
nRBC: 0 % (ref 0.0–0.2)

## 2019-12-15 LAB — PROTIME-INR
INR: 1.1 (ref 0.8–1.2)
Prothrombin Time: 13.6 seconds (ref 11.4–15.2)

## 2019-12-15 LAB — URINALYSIS, ROUTINE W REFLEX MICROSCOPIC
Bilirubin Urine: NEGATIVE
Glucose, UA: NEGATIVE mg/dL
Hgb urine dipstick: NEGATIVE
Ketones, ur: NEGATIVE mg/dL
Leukocytes,Ua: NEGATIVE
Nitrite: NEGATIVE
Protein, ur: NEGATIVE mg/dL
Specific Gravity, Urine: 1.016 (ref 1.005–1.030)
pH: 5 (ref 5.0–8.0)

## 2019-12-15 LAB — APTT: aPTT: 43 seconds — ABNORMAL HIGH (ref 24–36)

## 2019-12-15 LAB — SARS CORONAVIRUS 2 BY RT PCR (HOSPITAL ORDER, PERFORMED IN ~~LOC~~ HOSPITAL LAB): SARS Coronavirus 2: NEGATIVE

## 2019-12-15 LAB — PROCALCITONIN: Procalcitonin: 0.16 ng/mL

## 2019-12-15 LAB — LACTATE DEHYDROGENASE: LDH: 164 U/L (ref 98–192)

## 2019-12-15 LAB — I-STAT BETA HCG BLOOD, ED (MC, WL, AP ONLY): I-stat hCG, quantitative: <5 m[IU]/mL (ref <5)

## 2019-12-15 LAB — LACTIC ACID, PLASMA
Lactic Acid, Venous: 1 mmol/L (ref 0.5–1.9)
Lactic Acid, Venous: 0.7 mmol/L (ref 0.5–1.9)

## 2019-12-15 LAB — TSH: TSH: 1.788 u[IU]/mL (ref 0.350–4.500)

## 2019-12-15 LAB — ACETAMINOPHEN LEVEL: Acetaminophen (Tylenol), Serum: <10 ug/mL — ABNORMAL LOW (ref 10–30)

## 2019-12-15 LAB — AMMONIA: Ammonia: 31 umol/L (ref 9–35)

## 2019-12-15 LAB — HIV ANTIBODY (ROUTINE TESTING W REFLEX): HIV Screen 4th Generation wRfx: NONREACTIVE

## 2019-12-15 LAB — ETHANOL: Alcohol, Ethyl (B): <10 mg/dL (ref <10)

## 2019-12-15 LAB — CK: Total CK: 925 U/L — ABNORMAL HIGH (ref 38–234)

## 2019-12-15 MED ORDER — DULOXETINE HCL 30 MG PO CPEP
30.0000 mg | ORAL_CAPSULE | Freq: Every day | ORAL | Status: DC
  Administered 2019-12-15 – 2019-12-17 (×3): 30 mg via ORAL
  Filled 2019-12-15 (×3): qty 1

## 2019-12-15 MED ORDER — PIPERACILLIN-TAZOBACTAM 3.375 G IVPB 30 MIN
3.3750 g | Freq: Once | INTRAVENOUS | Status: AC
  Administered 2019-12-15: 3.375 g via INTRAVENOUS
  Filled 2019-12-15: qty 50

## 2019-12-15 MED ORDER — VANCOMYCIN HCL 750 MG/150ML IV SOLN
750.0000 mg | Freq: Two times a day (BID) | INTRAVENOUS | Status: DC
  Administered 2019-12-16: 750 mg via INTRAVENOUS
  Filled 2019-12-15 (×2): qty 150

## 2019-12-15 MED ORDER — GADOBUTROL 1 MMOL/ML IV SOLN
5.0000 mL | Freq: Once | INTRAVENOUS | Status: AC | PRN
  Administered 2019-12-15: 5 mL via INTRAVENOUS

## 2019-12-15 MED ORDER — SODIUM CHLORIDE 0.9 % IV SOLN
2.0000 g | INTRAVENOUS | Status: DC
  Administered 2019-12-15: 2 g via INTRAVENOUS
  Filled 2019-12-15 (×2): qty 20

## 2019-12-15 MED ORDER — ENOXAPARIN SODIUM 40 MG/0.4ML ~~LOC~~ SOLN
40.0000 mg | SUBCUTANEOUS | Status: DC
  Administered 2019-12-15 – 2019-12-16 (×2): 40 mg via SUBCUTANEOUS
  Filled 2019-12-15 (×2): qty 0.4

## 2019-12-15 MED ORDER — PIPERACILLIN-TAZOBACTAM 3.375 G IVPB
3.3750 g | Freq: Three times a day (TID) | INTRAVENOUS | Status: DC
  Administered 2019-12-15 – 2019-12-16 (×2): 3.375 g via INTRAVENOUS
  Filled 2019-12-15 (×2): qty 50

## 2019-12-15 MED ORDER — SODIUM CHLORIDE 0.9 % IV BOLUS (SEPSIS)
250.0000 mL | Freq: Once | INTRAVENOUS | Status: AC
  Administered 2019-12-15: 250 mL via INTRAVENOUS

## 2019-12-15 MED ORDER — SODIUM CHLORIDE 0.9 % IV SOLN
500.0000 mg | INTRAVENOUS | Status: DC
  Administered 2019-12-15: 500 mg via INTRAVENOUS
  Filled 2019-12-15: qty 500

## 2019-12-15 MED ORDER — SODIUM CHLORIDE 0.9 % IV BOLUS (SEPSIS)
1000.0000 mL | Freq: Once | INTRAVENOUS | Status: AC
  Administered 2019-12-15: 1000 mL via INTRAVENOUS

## 2019-12-15 MED ORDER — ACETAMINOPHEN 650 MG RE SUPP
650.0000 mg | Freq: Once | RECTAL | Status: AC
  Administered 2019-12-15: 650 mg via RECTAL
  Filled 2019-12-15: qty 1

## 2019-12-15 MED ORDER — VANCOMYCIN HCL IN DEXTROSE 1-5 GM/200ML-% IV SOLN
1000.0000 mg | Freq: Once | INTRAVENOUS | Status: AC
  Administered 2019-12-15: 1000 mg via INTRAVENOUS
  Filled 2019-12-15 (×2): qty 200

## 2019-12-15 MED ORDER — SODIUM CHLORIDE 0.9 % IV BOLUS (SEPSIS)
500.0000 mL | Freq: Once | INTRAVENOUS | Status: AC
  Administered 2019-12-15: 500 mL via INTRAVENOUS

## 2019-12-15 NOTE — Progress Notes (Addendum)
Pharmacy Antibiotic Note  Sheryl Campbell is a 59 y.o. female admitted on 12/15/2019 with sepsis.  Pharmacy has been consulted for vancomycin and Zosyn dosing.  Plan: - Vancomycin 1000 mg IV once, followed by vancomycin 750 mg IV every 12 hours - Zosyn 3.375 GM IV every 8 hours, 4-hour infusion - Monitor clinical status and renal function - Follow-up cultures and de-escalate    Temp (24hrs), Avg:103.2 F (39.6 C), Min:103.2 F (39.6 C), Max:103.2 F (39.6 C)  Recent Labs  Lab 12/15/19 1000  WBC 10.6*  CREATININE 0.53  LATICACIDVEN 1.0    Estimated Creatinine Clearance: 62.1 mL/min (by C-G formula based on SCr of 0.53 mg/dL).    No Known Allergies  Antimicrobials this admission: Azithromycin 5/28 >> Ceftriaxone 5/28 x1 Vancomycin 5/28 >> Zosyn 5/28 >>  Microbiology results: 5/28 UCx: sent 5/28 BCx x2: sent 5/28 COVID: negative     Donelda Mailhot L. Devin Going, [place] PGY1 Pharmacy Resident [phone] 12/15/19      3:01 PM  Please check AMION for all [place] phone numbers After 10:00 PM, call the [facility] [phone]

## 2019-12-15 NOTE — Progress Notes (Signed)
Orthopedic Tech Progress Note Patient Details:  Sheryl Campbell Dec 16, 1960 [id number] Reapplied short leg splint with heavy web to patient leg. Ortho Devices Type of Ortho Device: Short leg splint Ortho Device/Splint Location: RLE Ortho Device/Splint Interventions: Application   Post Interventions Patient Tolerated: Other (comment) Instructions Provided: Other (comment)   [name] 12/15/2019, 12:00 PM

## 2019-12-15 NOTE — H&P (Addendum)
[facility] Admission History and Physical Service Pager: [phone]  Patient name: Sheryl Campbell Medical record number: [id number] Date of birth: 06-03-1961 Age: 59 y.o. Gender: female  Primary Care Provider: Leonard Downing, MD Consultants: None Code Status: Full Preferred Emergency Contact: Husband, Lenise Herald  Chief Complaint: AMS, shaking, fever  Assessment and Plan: Sheryl Campbell is a 59 y.o. female presenting with AMS, fever, and shaking. PMH is significant for Bell's palsy, trimalleolar fracture of right ankle, depression, fibromyalgia, osteopenia, sciatica, scoliosis, intervertebral disc disease, arthritis.  Code Sepsis  Fever  AMS  Myoclonus 59 year old woman presents today with new onset altered mental status, fever, upper extremity myoclonus.  On presentation to the ED, patient was febrile to 103.2 F, tachycardic to 150, tachypneic to 24, SpO2 desat to 88% on 6L [place], BP fluctuating from hypertensive to normotensive, max 165/84. On exam today, patient does have a cough and bilateral rales. Patient has left-sided facial weakness, specifically difficulty elevating her left eyebrow. She has a history of Bell's Palsy and husband reports that she can usually move her eyebrows. On exam patient is very ill-appearing, diaphoretic, GCS 13 for waxing and waning mental status.  Patient is very confused when you ask questions, and needs continued prompting to focus, but intermittently will be able to appropriately answer.  She has myoclonic jerking in her outstretched arms, choreiform-like movement with her left arm continuously elevates to her head and then comes back down, she also has cogwheel rigidity in her upper extremities L>R, and she is restlessly moving about in bed.    ED: received 1.5L NS bolus, azithromycin, CTX for presumed CAP.  Labs: CMP WNL, LA WNL at 1, CBC with only mildly elevated WBC count to 10.6, however she does have a mildly  elevated ANC at 8 as well.  PT/INR within normal limits, APTT mildly elevated at 43.  Covid-19 negative. UDS, acetaminophen, and BAL clear.  Ammonia and LDH are similarly within normal limits at 31, and 164 respectively. Interestingly, CK elevated to 965, approx 4x greater than normal.  HIV negative.  Imaging: CXR read as CHF with small lateral pleural effusions, no concern for consolidation.  CT chest provides little more information but ultimately agrees with the x-ray: Bilateral pleural effusions, as well as bilateral lower lobe densities that most likely represent atelectasis or scarring; infiltrate less likely but not excluded. CT head without any abnormalities. MR brain normal for age.  DDX: Differential is very broad at this point in time, however there are a few leading diagnoses.  Patient could have sepsis, however at this time source is unclear.  With how ill patient appeared at admission, I would expect that if patient had sepsis her lab work would more closely reflect her overall clinical picture.  It is possible that patient has an increasing left shift with leukocytosis, however I would also expect her to have lactic acidosis, which she does not have.  Additionally the myoclonic and choreiform movements are quite unexpected in sepsis. Another consideration is neuroleptic malignant syndrome or serotonergic withdrawal syndrome.  Patient is on both Abilify and Cymbalta for depression, combination she has been on for at least 4 years without problem previously.  Patient was put on Abilify about 20 years ago, unclear which provider put her on this, but her PCP has continued refilling it.  Patient reports that she has been out of Cymbalta since May 22, which is more consistent with SSRI withdrawal syndrome, the symptoms of which can be  quite broad. However, per pharamacy, patient had #90 filled recently. NMS is a diagnosis of exclusion and there is no clear consensus on symptoms, however patient meets  all criteria for what consensus exists: On a causative agent (Abilify), hypothermic, myoclonus, blood pressure fluctuation, tachycardic, tachypneic, confusion and fluctuating mental status, and a CK that is roughly 4 times greater than normal.  Plan to consult psych.  Discussion with pharmacist regarding psychotropic medications, they recommend starting back Cymbalta as patient has been off it for 5 days at a lower dose.  Other possible causes of AMS: Unlikely to be meningitis as patient does not have any Kernig sign or neck stiffness; no MRI findings c/w an encephalitis; withdrawal from opiates--patient is on a fentanyl patch so this is unlikely (though patient does have Rx for #180 percocet monthly and UDS is negative); toxins-- labs have been clear so far; seizure-will obtain EEG, however, no obvious LOC and type of movements are less expected for seizure; stroke ruled out with neuro imaging; hypoxia/hypercarbia- bicarb WNL, satting well on oxygen; endocrine-no hypotension, will obtain TSH.  Will obtain vitamin B12 and RPR additionally. Can also consider sudden cessation of Baclofen, as this is known to cause NMS-like symptoms.  Will continue with broad-spectrum antibiotics at this time as we cannot rule out a source of infection.  We will follow-up blood and urine cultures.  Recommend to consider echo as patient had possible CHF on CXR (no obvious BLEE).  Be judicious with fluids for this reason.  Vitals have stabilized patient has improved after fluid resuscitation and antibiotics. - Admit to progressive, FPTS, attending Dr. Gwendlyn Deutscher - Continuous cardiac monitoring and pulse oximetry - O2 therapy with goal of SpO2>90% - Continue broad spectrum abx: Vanc, zosyn (s/p azithro x 1) - Cymbalta at lower dose (76m, then back to 671m - continue supportive care  - f/u Blood cultures, urine cultures  - f/u TSH, Vit B12, RPR, procalcitonin - AM CMP, CBC - Consult psych - EEG - Echo for possible CHF seen on  CXR - Up with assistance - Dysphagia 3 diet - Lovenox for DVT ppx - PT/OT/SLP eval and treat  Right ankle fracture Patient fractured right ankle 3 days ago, trimalleolar s/p reduction and .  Patient currently awaiting surgery.  Was rewrapped today and stable.  Will f/u any Ortho recommendations, esp re: pain.  She was not in pain today. -monitor pain -f/u ortho recs  Fibromyalgia  Arthritis  H/O spinal surgery Home meds include fentanyl patch q. weekly (put on 2 days ago), Norco 10-325 mg tablets twice daily (#180 per month per PDMP). Will have to clarify with patient her real dose and what she takes daily, Voltaren gel, baclofen.  Do not expect withdrawal due to fentanyl patch currently on.  Monitor for pain can add back home medications or an NSAID if needed. -Consider adding back NSAIDs or home meds if needed for pain -Reach out to provider?   FEN/GI: dysphagia 3 diet Prophylaxis: lovenox  Disposition: to progressive pending medical work up  History of Present Illness:  BrJULI ODOMs a [age].o. female presenting with fever, AMS, and shaking.  According to her husband, on the night of 5/25 patient took Tylenol PM and benadryl because she has trouble sleeping.  He found his wife sitting with her head down, slow stertorous breathing, difficult to arouse, confused.  He put patient in bed, however the next morning she was still confused and tripped in the bathroom resulting in the trimalleolar  fracture of her right ankle. Yesterday she was doing fine, resting in bed, acting as her normal self.  When her husband woke up this morning and checked on her, she was hot to the touch, confused, with intermittent jerking of her limbs that was concerning to her husband as possible seizures. She was a pack-a-day smoker until 4 weeks ago, when she quit by using nicotine patches.  Patient also has frequent difficulty urinating, although she states that has been present for a long time.  Her husband only  noticed this recently as she is needed his assistance to get to the bathroom with her broken ankle for the last 2 days.  Patient has no history of seizure-like activity, she does have a history of Bell's palsy, unclear when she was diagnosed, put on chart review she had a history of Bell's palsy as far back as 2006.  Patient has a diagnosis of depression and has been using Abilify for at least 20 years.  It is not clear which physician initially started this medication, however her PCP who has been seeing her for about that length of time said that he has been refilling it since they first met.  She also takes Cymbalta and has been using Cymbalta with Abilify for at least 4 years without problem.  Patient has been taking Abilify every day, but has not had Cymbalta since May 22 as her prescription ran out. Went to [place] to [place] country with her sister last week, no sick contacts known.  Review Of Systems: Per HPI with the following additions:   Review of Systems  Constitutional: Positive for activity change, chills, fatigue and fever. Negative for unexpected weight change.  HENT: Negative for congestion, facial swelling, postnasal drip, rhinorrhea, sinus pressure, sinus pain, sneezing and sore throat.   Eyes: Negative for photophobia and redness.  Respiratory: Positive for cough. Negative for shortness of breath, wheezing and stridor.   Cardiovascular: Negative for chest pain, palpitations and leg swelling.  Gastrointestinal: Positive for diarrhea. Negative for abdominal distention, abdominal pain, blood in stool, constipation, nausea and vomiting.  Genitourinary: Positive for difficulty urinating. Negative for flank pain, pelvic pain, urgency and vaginal bleeding.  Musculoskeletal: Positive for arthralgias, back pain and joint swelling. Negative for myalgias, neck pain and neck stiffness.  Neurological: Positive for tremors and facial asymmetry. Negative for dizziness, speech difficulty,  light-headedness, numbness and headaches.  Psychiatric/Behavioral: Positive for decreased concentration. Negative for agitation.    There are no problems to display for this patient.  Past Medical History: Past Medical History:  Diagnosis Date  . Arthritis   . Bell's palsy   . Cancer ([place])    skin  . Fibromyalgia   . Intervertebral disk disease    "my whole spine"  . Osteopenia   . Sciatica   . Scoliosis    Past Surgical History: Past Surgical History:  Procedure Laterality Date  . BACK SURGERY  05/2004   Harrington rods T3- T11  . BUNIONECTOMY Bilateral 07/2001  . CHOLECYSTECTOMY  02/2000  . NECK SURGERY  10/2002   cervical diskectomy, C5-C7  . NOSE SURGERY  07/1993   deviated septum    Social History: Social History   Tobacco Use  . Smoking status: Current Every Day Smoker    Packs/day: 1.00  . Smokeless tobacco: Never Used  Substance Use Topics  . Alcohol use: No    Comment: rare  . 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  Family History: Family History  Problem Relation Age of Onset  . Cancer - Lung Mother   . Diabetes Sister   . Diabetes Brother    Allergies and Medications: No Known Allergies No current facility-administered medications on file prior to encounter.   Current Outpatient Medications on File Prior to Encounter  Medication Sig Dispense Refill  . ARIPiprazole (ABILIFY) 2 MG tablet Take 2 mg by mouth daily.     . baclofen (LIORESAL) 10 MG tablet Take 1 tablet by mouth every 6 (six) hours as needed for pain.    Marland Kitchen diclofenac Sodium (VOLTAREN) 1 % GEL Apply 1 application topically 4 (four) times daily as needed for pain.    . DULoxetine (CYMBALTA) 60 MG capsule Take 60 mg by mouth daily.    . fentaNYL (DURAGESIC) 100 MCG/HR Place 1 patch onto the skin every 3 (three) days.    Marland Kitchen gabapentin (NEURONTIN) 300 MG  capsule Take 300 mg by mouth 4 (four) times daily.     Marland Kitchen HYDROcodone-acetaminophen (NORCO) 10-325 MG tablet Take 1 tablet by mouth 2 (two) times daily as needed for moderate pain.     . Multiple Vitamin (MULTIVITAMIN ADULT PO) Take 1 tablet by mouth daily.    Marland Kitchen POTASSIUM PO Take 1 tablet by mouth daily.    . Turmeric (QC TUMERIC COMPLEX PO) Take 1 tablet by mouth daily.     Objective: BP (!) 101/51   Pulse (!) 111   Temp (!) 103.2 F (39.6 C) (Oral)   Resp 11   SpO2 90%  Exam: General: very ill-appearing woman, waxing/awning alertness, responds to voice, appears older than stated age, diaphoretic, No obvious distress; however patient is very restless in bed. Husband is at bedside . Eyes: anicteric ENTM: clear oropharynx, dry mucous membranes. Retains a few teeth, but most teeth gone.  Neck: supple, full ROM. No nodules or LAD. No obvious thyroid abnormalities  Cardiovascular: tachycardic, regular rhythm, no r/g. 1/6 systolic ejection murmur, best heard at LSB.  Respiratory: diffuse b/l rales that increase at the bases, productive cough  Gastrointestinal: NT, ND, abdominal muscles hypertonic  MSK: swollen R ankle with mild ecchymosis over shin, no erythema, no obvious draining  Derm: Fentanyl patch on R buttock, no obvious lashes or lesions.  Neuro: Cranial Nerves: II: Visual Fields are full. PERRL. Pupils are reactive to light, but mildly constricted at baseline.   III,IV, VI: EOMI without ptosis or diplopia, but had to repeatedly awaken to complete exam V: Facial sensation is asymmetric to touch, could not differentiate VII: Facial movement is asymmetric, L eyebrow does not elevate, minimal movement VIII: hearing is intact to voice X: Palate elevates symmetrically XI: Shoulder shrug is symmetric. XII: tongue is midline without atrophy or fasciculations.  Motor: General hypertonia appreciated. Bulk is normal. 5/5 strength was present in all four extremities.  Sensory: Sensation is  symmetric to light touch in the arms and legs. Deep Tendon Reflexes: 2++ and symmetric in the biceps and patellae.  Psych: unable to assess mood as patient's alertness waxes and wanes. She is involved in some parts of conversation, but at other times, she is restless in bed and seems to be staring aimlessly. She has to be prompted again for answers when she enters this "trance-like" stare.   Labs and Imaging: CBC BMET  Recent Labs  Lab 12/15/19 1000  WBC 10.6*  HGB 9.4*  HCT 31.7*  PLT  382   Recent Labs  Lab 12/15/19 1000  NA 138  K 4.4  CL 102  CO2 26  BUN 9  CREATININE 0.53  GLUCOSE 113*  CALCIUM 8.7*     EKG: EKG Interpretation  Date/Time:  Friday Dec 15 2019 09:52:52 EDT Ventricular Rate:  141 PR Interval:    QRS Duration: 88 QT Interval:  266 QTC Calculation: 408 R Axis:   31 Text Interpretation: Sinus tachycardia Since last tracing rate faster Confirmed by Noemi Chapel [phone]) on 12/15/2019 11:10:03 AM  Gladys Damme, MD 12/15/2019, 12:31 PM PGY-1, [place] Intern pager: [phone], text pages welcome  FPTS Upper-Level Resident Addendum I have independently interviewed and examined the patient. I have discussed the above with the original author and agree with their documentation. My edits for correction/addition/clarification are in - purple. Please see also any attending notes.  [facility] Service pager: [phone] (text pages welcome through AMION)  Wilber Oliphant, M.D.  PGY-2 12/15/2019 10:14 PM

## 2019-12-15 NOTE — Progress Notes (Signed)
Evaluated patient at bedside approximately 6 PM.  Patient seemed to be improved from report from earlier.  She was able to tell me her name, the month, where she was, but not why she was there (believes it was due to foot surgery).  Does not remember other details from the past few days but did tell me that she has not taken her Cymbalta since last Saturday (6 days ago).  She also states she is not taking her aripiprazole in a manner other than the way it has been prescribed for years (daily).  On exam she was alert and oriented to person, place, year and month.  Cranial nerves II through XII grossly intact.  She had rigidity in the upper and lower extremities and myoclonus of her hands when they are outstretched.  Reflexes intact bilaterally.  2+ in the brachial and patellar tendon.  Patient presented meeting SIRS criteria with tachycardia, tachypnea, fever, very mild increase in WBC.  Since that time the does not appear to be a source of infection identified.  Patient does not have significant respiratory symptoms and imaging so far has been unrevealing. No signs of UTI.  Given her neurologic symptoms and lack of identified infectious source, now favor NMS/antidepressent withdrawal over sepsis.  Will leave on abx for now. Will hold abilify in case of NMS, but add back her cymbalta at a lower dose for withdrawal syndrome.  Patient passed bedside swallow test and was given dysphagia 3 diet.    Clemetine Marker, MD. PGY2.

## 2019-12-15 NOTE — ED Notes (Signed)
Pt tx to MRI

## 2019-12-15 NOTE — Plan of Care (Signed)

## 2019-12-15 NOTE — Progress Notes (Addendum)
I evaluated patient briefly in the ED prior to transfer to the floor. I will discuss management plan with resident and cosign H&P once completed. More detailed note to follow.  NB: I messaged the resident/Dr. Maudie Mercury, to await swallow evaluation prior to ordering a diet to assess for aspiration risk.

## 2019-12-15 NOTE — Plan of Care (Signed)
  Problem: Education: Goal: Knowledge of General Education information will improve Description: Including pain rating scale, medication(s)/side effects and non-pharmacologic comfort measures 12/15/2019 2018 by Nelia Shi, RN Outcome: Progressing 12/15/2019 2018 by Nelia Shi, RN Outcome: Progressing

## 2019-12-15 NOTE — ED Triage Notes (Signed)
Pt bib ems from home with full body jerks however pt remained alert and oriented throughout the event.  CBG 86 150/60 ST 130 Oxygen 70's placed on oxygen with improvement to 90's.

## 2019-12-15 NOTE — ED Provider Notes (Signed)
[facility] EMERGENCY DEPARTMENT Provider Note   CSN: [id number] Arrival date & time: 12/15/19  0945     History Chief Complaint  Patient presents with  . Code Sepsis    Sheryl Campbell is a 59 y.o. female.  HPI   This patient is a critically ill-appearing 59 year old female with a known history of a Bell palsy, history of a recent fracture of her right ankle, she had a trimalleolar fracture that was demonstrated on May 26 when she presented after miss stepping getting out of the bed in the morning.  She was immobilized in emergency department after a reduction of the fracture dislocation and had done well being able to be discharged.  She presents from home after having altered mental status and possible seizure-like activity though on further questioning it appears that the patient has had some myoclonic jerking which is coming on over the last couple of days.  She was noted to be tachycardic, hot to the touch, hypoxic and a code sepsis was activated on arrival when it was realized that her temperature was 103.2.  She is having some difficulty answering questions, a level 5 caveat applies secondary to altered mental status.  The patient does endorse having an occasional cough  Past Medical History:  Diagnosis Date  . Arthritis   . Bell's palsy   . Cancer ([place])    skin  . Fibromyalgia   . Intervertebral disk disease    "my whole spine"  . Osteopenia   . Sciatica   . Scoliosis     There are no problems to display for this patient.   Past Surgical History:  Procedure Laterality Date  . BACK SURGERY  05/2004   Harrington rods T3- T11  . BUNIONECTOMY Bilateral 07/2001  . CHOLECYSTECTOMY  02/2000  . NECK SURGERY  10/2002   cervical diskectomy, C5-C7  . NOSE SURGERY  07/1993   deviated septum     OB History   No obstetric history on file.     Family History  Problem Relation Age of Onset  . Cancer - Lung Mother   . Diabetes Sister   . Diabetes  Brother     Social History   Tobacco Use  . Smoking status: Current Every Day Smoker    Packs/day: 1.00  . Smokeless tobacco: Never Used  Substance Use Topics  . Alcohol use: No    Comment: rare  . Drug use: No    Home Medications Prior to Admission medications   Medication Sig Start Date End Date Taking? Authorizing Provider  ARIPiprazole (ABILIFY) 2 MG tablet Take 2 mg by mouth daily.  08/19/16   [provider]  baclofen (LIORESAL) 10 MG tablet Take 1 tablet by mouth every 6 (six) hours as needed for pain. 12/07/19   [provider]  diclofenac Sodium (VOLTAREN) 1 % GEL Apply 1 application topically 4 (four) times daily as needed for pain. 09/12/19   [provider]  DULoxetine (CYMBALTA) 60 MG capsule Take 60 mg by mouth daily. 12/11/19   [provider]  fentaNYL (DURAGESIC) 100 MCG/HR Place 1 patch onto the skin every 3 (three) days. 12/07/19   [provider]  gabapentin (NEURONTIN) 300 MG capsule Take 300 mg by mouth 4 (four) times daily.  08/24/16   [provider]  HYDROcodone-acetaminophen (NORCO) 10-325 MG tablet Take 1 tablet by mouth 2 (two) times daily as needed for moderate pain.  08/01/16   [provider]  Multiple  Vitamin (MULTIVITAMIN ADULT PO) Take 1 tablet by mouth daily.    [provider]  POTASSIUM PO Take 1 tablet by mouth daily.    [provider]  Turmeric (QC TUMERIC COMPLEX PO) Take 1 tablet by mouth daily.    [provider]    Allergies    Patient has no known allergies.  Review of Systems   Review of Systems  Unable to perform ROS: Mental status change    Physical Exam Updated Vital Signs Pulse (!) 113   Temp (!) 103.2 F (39.6 C) (Oral)   Resp (!) 24   SpO2 (!) 88%   Physical Exam Vitals and nursing note reviewed.  Constitutional:      General: She is not in acute distress.    Appearance: She is well-developed.  HENT:     Head: Normocephalic and  atraumatic.     Mouth/Throat:     Pharynx: No oropharyngeal exudate.  Eyes:     General: No scleral icterus.       Right eye: No discharge.        Left eye: No discharge.     Conjunctiva/sclera: Conjunctivae normal.     Pupils: Pupils are equal, round, and reactive to light.  Neck:     Thyroid: No thyromegaly.     Vascular: No JVD.  Cardiovascular:     Rate and Rhythm: Regular rhythm. Tachycardia present.     Heart sounds: Normal heart sounds. No murmur. No friction rub. No gallop.   Pulmonary:     Effort: Pulmonary effort is normal. No respiratory distress.     Breath sounds: Rales present. No wheezing.     Comments: Rales left greater than right, tachypneic, mild accessory muscle use Abdominal:     General: Bowel sounds are normal. There is no distension.     Palpations: Abdomen is soft. There is no mass.     Tenderness: There is no abdominal tenderness.  Musculoskeletal:        General: Swelling, tenderness and deformity present.     Cervical back: Normal range of motion and neck supple.     Comments: Right ankle with bruising swelling and mild tenderness.  This is the site of the prior fracture  Lymphadenopathy:     Cervical: No cervical adenopathy.  Skin:    General: Skin is warm and dry.     Findings: No erythema or rash.  Neurological:     Mental Status: She is alert.     Coordination: Coordination normal.     Comments: Diffuse myoclonic jerking, left-sided facial droop, inability to close the left eye.  Patient is unable to lift either of her arms above the shoulders with frequent myoclonic jerking, able to lift both of her legs  Psychiatric:        Behavior: Behavior normal.     ED Results / Procedures / Treatments   Labs (all labs ordered are listed, but only abnormal results are displayed) Labs Reviewed  COMPREHENSIVE METABOLIC PANEL - Abnormal; Notable for the following components:      Result Value   Glucose, Bld 113 (*)    Calcium 8.7 (*)    Albumin 3.1  (*)    All other components within normal limits  CBC WITH DIFFERENTIAL/PLATELET - Abnormal; Notable for the following components:   WBC 10.6 (*)    RBC 3.68 (*)    Hemoglobin 9.4 (*)    HCT 31.7 (*)    MCH 25.5 (*)  MCHC 29.7 (*)    RDW 16.5 (*)    Neutro Abs 8.1 (*)    All other components within normal limits  APTT - Abnormal; Notable for the following components:   aPTT 43 (*)    All other components within normal limits  SARS CORONAVIRUS 2 BY RT PCR (HOSPITAL ORDER, [facility])  CULTURE, BLOOD (ROUTINE X 2)  CULTURE, BLOOD (ROUTINE X 2)  URINE CULTURE  LACTIC ACID, PLASMA  PROTIME-INR  URINALYSIS, ROUTINE W REFLEX MICROSCOPIC  LACTIC ACID, PLASMA  I-STAT BETA HCG BLOOD, ED ([facility], [facility], [facility] ONLY)    EKG EKG Interpretation  Date/Time:  Friday Dec 15 2019 09:52:52 EDT Ventricular Rate:  141 PR Interval:    QRS Duration: 88 QT Interval:  266 QTC Calculation: 408 R Axis:   31 Text Interpretation: Sinus tachycardia Since last tracing rate faster Confirmed by Noemi Chapel [phone]) on 12/15/2019 11:10:03 AM   Radiology CT Head Wo Contrast  Result Date: 12/15/2019 CLINICAL DATA:  Altered mental status. EXAM: CT HEAD WITHOUT CONTRAST TECHNIQUE: Contiguous axial images were obtained from the base of the skull through the vertex without intravenous contrast. COMPARISON:  None. FINDINGS: Brain: No evidence of acute infarction, hemorrhage, hydrocephalus, extra-axial collection or mass lesion/mass effect. Vascular: No hyperdense vessel or unexpected calcification. Skull: Normal. Negative for fracture or focal lesion. Sinuses/Orbits: No acute finding. Other: None. IMPRESSION: Normal head CT. Electronically Signed   By: Marijo Conception M.D.   On: 12/15/2019 10:53   DG Chest Port 1 View  Result Date: 12/15/2019 CLINICAL DATA:  Cough, shortness of breath, sepsis. EXAM: PORTABLE CHEST 1 VIEW COMPARISON:  05/21/2004 FINDINGS: The cardiac silhouette remains near  the upper limit of normal in size. Increased prominence of the interstitial markings with an interval small, partially loculated right pleural effusion and small left pleural effusion. Harrington rods and scoliosis are stable. Cholecystectomy clips and cervical spine fixation hardware are again demonstrated. IMPRESSION: Interval mild changes of congestive heart failure with a small, partially loculated right pleural effusion and small left pleural effusion. Electronically Signed   By: Claudie Revering M.D.   On: 12/15/2019 10:38   DG Ankle Right Port  Result Date: 12/13/2019 CLINICAL DATA:  Status post reduction. EXAM: PORTABLE RIGHT ANKLE - 2 VIEW COMPARISON:  Earlier same day ankle radiographs. FINDINGS: Interval overlying casting material. Redemonstrated complex trimalleolar fracture. Interval reduction of the ankle joint. Overlying the calcaneus on the lateral view is in oval shaped density. IMPRESSION: Interval reduction of ankle dislocation. Redemonstrated trimalleolar fracture. New oval shaped density projecting over the calcaneus on lateral view may represent osseous fragment or overlying casting material. Electronically Signed   By: Lovey Newcomer M.D.   On: 12/13/2019 15:15    Procedures .Critical Care Performed by: Noemi Chapel, MD Authorized by: Noemi Chapel, MD   Critical care provider statement:    Critical care time (minutes):  35   Critical care time was exclusive of:  Separately billable procedures and treating other patients and teaching time   Critical care was necessary to treat or prevent imminent or life-threatening deterioration of the following conditions:  Sepsis   Critical care was time spent personally by me on the following activities:  Blood draw for specimens, development of treatment plan with patient or surrogate, discussions with consultants, evaluation of patient's response to treatment, examination of patient, obtaining history from patient or surrogate, ordering and  performing treatments and interventions, ordering and review of laboratory studies, ordering and  review of radiographic studies, pulse oximetry, re-evaluation of patient's condition and review of old charts   (including critical care time)  Medications Ordered in ED Medications  cefTRIAXone (ROCEPHIN) 2 g in sodium chloride 0.9 % 100 mL IVPB (0 g Intravenous Stopped 12/15/19 1113)  azithromycin (ZITHROMAX) 500 mg in sodium chloride 0.9 % 250 mL IVPB (500 mg Intravenous New Bag/Given 12/15/19 1124)  sodium chloride 0.9 % bolus 1,000 mL (0 mLs Intravenous Stopped 12/15/19 1107)    And  sodium chloride 0.9 % bolus 500 mL (0 mLs Intravenous Stopped 12/15/19 1107)    And  sodium chloride 0.9 % bolus 250 mL (0 mLs Intravenous Stopped 12/15/19 1107)  acetaminophen (TYLENOL) suppository 650 mg (650 mg Rectal Given 12/15/19 1120)    ED Course  I have reviewed the triage vital signs and the nursing notes.  Pertinent labs & imaging results that were available during my care of the patient were reviewed by me and considered in my medical decision making (see chart for details).    MDM Rules/Calculators/A&P                      This patient appears septic, she meets criteria including hypoxia tachycardia and a fever.  The cause of her infection is not exactly clear however given her cough and rales on exam I suspect a pulmonary source.  We will check a urinalysis, blood cultures, chest x-ray, she will also need a CT scan due to her abnormal neurologic symptoms although the myoclonic jerking is much more likely to be related to the underlying severe sepsis.  30 cc/kg of normal saline has been ordered, lactated Ringer's is not available at this time.  EKG will be ordered, chest x-ray will be ordered, the patient is being cared for on multiple different levels with multiple antibiotics, multiple fluid boluses and is critically ill.  She will need to be admitted to high level of care  The patient has been  reexamined multiple times, she has a urinalysis which is negative, a lactate of 1, negative for Covid, her mental status is slightly improved after fluids and antibiotics.  Antipyretics given for fever and the medicine resident was consulted for admission to the hospital.  This patient has obvious sepsis, unclear source, critically ill  Final Clinical Impression(s) / ED Diagnoses Final diagnoses:  Sepsis, due to unspecified organism, unspecified whether acute organ dysfunction present [facility])  Myoclonic jerking      Noemi Chapel, MD 12/15/19 1228

## 2019-12-16 LAB — COMPREHENSIVE METABOLIC PANEL
ALT: 23 U/L (ref 0–44)
AST: 46 U/L — ABNORMAL HIGH (ref 15–41)
Albumin: 2.8 g/dL — ABNORMAL LOW (ref 3.5–5.0)
Alkaline Phosphatase: 91 U/L (ref 38–126)
Anion gap: 12 (ref 5–15)
BUN: 9 mg/dL (ref 6–20)
CO2: 26 mmol/L (ref 22–32)
Calcium: 8.7 mg/dL — ABNORMAL LOW (ref 8.9–10.3)
Chloride: 98 mmol/L (ref 98–111)
Creatinine, Ser: 0.47 mg/dL (ref 0.44–1.00)
GFR calc Af Amer: >60 mL/min (ref >60)
GFR calc non Af Amer: >60 mL/min (ref >60)
Glucose, Bld: 84 mg/dL (ref 70–99)
Potassium: 3.7 mmol/L (ref 3.5–5.1)
Sodium: 136 mmol/L (ref 135–145)
Total Bilirubin: 0.6 mg/dL (ref 0.3–1.2)
Total Protein: 6.9 g/dL (ref 6.5–8.1)

## 2019-12-16 LAB — TSH: TSH: 1.041 u[IU]/mL (ref 0.350–4.500)

## 2019-12-16 LAB — VITAMIN B12: Vitamin B-12: 582 pg/mL (ref 180–914)

## 2019-12-16 LAB — CBC
HCT: 28.7 % — ABNORMAL LOW (ref 36.0–46.0)
Hemoglobin: 8.5 g/dL — ABNORMAL LOW (ref 12.0–15.0)
MCH: 25.8 pg — ABNORMAL LOW (ref 26.0–34.0)
MCHC: 29.6 g/dL — ABNORMAL LOW (ref 30.0–36.0)
MCV: 87 fL (ref 80.0–100.0)
Platelets: 321 10*3/uL (ref 150–400)
RBC: 3.3 MIL/uL — ABNORMAL LOW (ref 3.87–5.11)
RDW: 16.1 % — ABNORMAL HIGH (ref 11.5–15.5)
WBC: 8 10*3/uL (ref 4.0–10.5)
nRBC: 0 % (ref 0.0–0.2)

## 2019-12-16 LAB — HEMOGLOBIN A1C
Hgb A1c MFr Bld: 5.8 % — ABNORMAL HIGH (ref 4.8–5.6)
Mean Plasma Glucose: 119.76 mg/dL

## 2019-12-16 LAB — PROCALCITONIN: Procalcitonin: 0.16 ng/mL

## 2019-12-16 LAB — CK: Total CK: 897 U/L — ABNORMAL HIGH (ref 38–234)

## 2019-12-16 MED ORDER — HYDROCODONE-ACETAMINOPHEN 10-325 MG PO TABS
1.0000 | ORAL_TABLET | Freq: Two times a day (BID) | ORAL | Status: DC | PRN
  Administered 2019-12-16 – 2019-12-19 (×5): 1 via ORAL
  Filled 2019-12-16 (×5): qty 1

## 2019-12-16 MED ORDER — ARIPIPRAZOLE 2 MG PO TABS
2.0000 mg | ORAL_TABLET | Freq: Every day | ORAL | Status: DC
  Administered 2019-12-16 – 2019-12-19 (×3): 2 mg via ORAL
  Filled 2019-12-16 (×5): qty 1

## 2019-12-16 NOTE — Progress Notes (Signed)
Family Medicine Teaching Service Daily Progress Note Intern Pager: [phone]  Patient name: Sheryl Campbell Medical record number: [id number] Date of birth: 06-30-1961 Age: 59 y.o. Gender: female  Primary Care Provider: Leonard Downing, MD Consultants: Pharmacy  Code Status: Full Code   Pt Overview and Major Events to Date:  Hospital Day: 2 12/15/2019: admitted for acute AMS, myoclonus, code sepsis   Antibiotics  5/28 - CTX, azithro x1  Vancomycin (5/28- )  Zosyn(5/28- )   Assessment and Plan: Sheryl Campbell is a 59 y.o. female who presented w/ altered mental status, myoclonus, code sepsis.  PMHx s/f chronic pain syndrome, fibromyalgia, chronic opiate therapy, depression, Bell's palsy, recent trimalleolar right fracture, tobacco use (quit 4 weeks ago).  Altered mental status Myoclonus Today, patient is back to her baseline mental status. Her husband was reminding her of all that has happened over the last few days. Patient reports everything feels like a fog. Husband brought all of her medications to bedside today.  Work-up thus far has been negative for CVA, encephalitis, encephalopathy (metabolic).  Patient is on long-term Abilify and duloxetine. She reports running out of the duloxetine on 5/22.  While reconciling her physical medications, appears that patient had refill of duloxetine on 12/11/2019 with 29 out of 30 pills left in the bottle.   Given patient's quick recovery, seemingly overnight, it is more likely that her condition stemmed from medication issue.  This is further supported by negative imaging and lab work-up. Patient was restarted on her Cymbalta last night, however, her presentation was not typical of Cymbalta withdrawal.  Patient is on multiple pain medications including baclofen.  She is missing 20 baclofen pills from her #110 prescription.  She reports using this 3-4 times a day and filled on 5/18.  It is also possible that she was having NMS like symptoms from  sudden withdrawal from baclofen. However, no baclofen was administered to patient overnight; making this less likely.  Finally, patient was negative to opioids on admission. She reports that she was out of town and likely took more than she needed to during that time.  She reports that she ran out of [place] on Wednesday.  # Rx date  Pill count  Patient reported dose   Duloxetine #90 12/11/19 29 Last taken 5/22 AM  Abilify  90  22 Last taken 5/26? PM   Gabapentin  140 10/10/19 25 Reports 3-4x a day. Rx for 5-6x a day.   Gabapentin  170 12/05/19 Not counted Full bottle   Methocarbomol 180 01/24/19 1/10th of bottle full    Baclofen  110 12/05/19 90 Reports 3-4x a day   Norco --    6 a day   -Continue supportive care -Restarted Cymbalta 30 mg, plans to increase to 60 mg -restarting abilify  -If no improvement, consider consulting psych or obtaining EEG -follow up repeat CK  -SLP graduated to CM diet  -PT/OT  -d/c abx as able   SIRS criteria Patient presented with fever to 103, tachycardia, tachypnea.  No leukocytosis present.  No UTI, no obvious pneumonia, no erythema suggesting infection of right ankle fracture.  Procalcitonin 0.16. - f/u repeat procalcitonin  - discontinued antibiotics   New O2 status Required 6 L overnight. CT chest with atalectasis vs. Scarring. Infiltrate cannot be ruled out.  With patient's recent trimalleolar fracture, if patient continues to have increased oxygen requirement, will obtain CTA to rule out pulmonary embolism. Unlikely to be pna given resolution of fever, no leukocytosis, procalcitonin.  Patient w/ chronic cough. Will start weaning O2 today.  -wean O2  -if worsening respiratory symptoms -D/c antibiotics accordingly   CXR w/ cardiomegaly  Patient noted to have rales on lungs.  She is a longtime smoker, just recently quit 4 weeks ago and still occasionally has a cigarette.  Will obtain echocardiogram, though unlikely to be cause of her  presentation.  Trimalleolar fracture, right Fracture 3 days ago status post reduction in the ED.  Checked ankle last night on admission and without any erythema.  Patient not in any pain at this time  Fibromyalgia Chronic pain syndrome Spinal surgery Chronic opiate use Per PDMP, patient is prescribed monthly #180 Norco 10-325 tablets, #10 fentanyl patch.  Patient's UDS is negative on admission.  She does have a fentanyl patch present. -Assess pain in altered mental status  -Continue fentanyl patch -Restart home Norco 10/325  Depression Patient has been on Abilify and duloxetine for several years each.  Unclear who started Abilify.  Patient reports not taking these medications over the last several days. -Restarting duloxetine 30 mg -Restart Abilify  Anemia Hemoglobin 9.4>8.5, likely iatrogenic.  No signs of bleeding.  Last hemoglobin in 2018 was 12.1.  We will continue to monitor. - follow up outpatient   FEN/GI:  . Fluids: Saline lock . Electrolytes: CMP pending . Nutrition: dysphagia 3    Access: L PIV AC and forearm  VTE prophylaxis: Lovenox 40 (CrCl>30)  Disposition: When medically stable     Subjective:  NAEO.   Objective: Temp:  [98.2 F (36.8 C)-99.7 F (37.6 C)] 98.4 F (36.9 C) (05/29 0832) Pulse Rate:  [93-126] 93 (05/29 0832) Cardiac Rhythm: Normal sinus rhythm (05/29 0800) Resp:  [11-22] 19 (05/29 0832) BP: (101-136)/(51-87) 127/66 (05/29 0832) SpO2:  [82 %-100 %] 87 % (05/29 0832) Weight:  [56.1 kg-61.7 kg] 56.1 kg (05/29 0326) Intake/Output      05/28 0701 - 05/29 0700 05/29 0701 - 05/30 0700   P.O. 240    IV Piggyback 690.7    Total Intake(mL/kg) 930.7 (16.6)    Urine (mL/kg/hr) 625    Total Output 625    Net +305.7         Urine Occurrence 1 x        Physical Exam: Gen: NAD, alert, non-toxic, sitting comfortably, tired appearing. Skin: Warm and dry. No obvious rashes, lesions, or trauma. HEENT: NCAT. EOMI. No conjunctival pallor or  injection. No scleral icterus or injection.  MMM.  Neck: Soft, supple. No LAD. No JVD. CV: RRR.  Normal S1, S2. No m/r/g.  RP 2+ bilaterally. No BLEE. Resp: CTAB.  No w/r/r.  No increased WOB Abd: +BS. NTND on palpation. Psych: Cooperative. Makes eye contact. Speech normal.  Extremities: Moves extremities spontaneously. Extremities warm and well perfused.  Neuro: CN II-XII grossly intact. No FNDs. 2+ reflexes.    Laboratory: I have personally read and reviewed all labs and imaging studies.  CBC: Recent Labs  Lab 12/15/19 1000 12/16/19 0745  WBC 10.6* 8.0  NEUTROABS 8.1*  --   HGB 9.4* 8.5*  HCT 31.7* 28.7*  MCV 86.1 87.0  PLT 382 321   CMP: Recent Labs  Lab 12/15/19 1000 12/16/19 0745  NA 138 136  K 4.4 3.7  CL 102 98  CO2 26 26  GLUCOSE 113* 84  BUN 9 9  CREATININE 0.53 0.47  CALCIUM 8.7* 8.7*  ALBUMIN 3.1* 2.8*   CBG: No results for input(s): GLUCAP in the last 168 hours. Micro: Covid Negative  Recent Results (from the past 240 hour(s))  Blood Culture (routine x 2)     Status: None (Preliminary result)   Collection Time: 12/15/19 10:00 AM   Specimen: BLOOD  Result Value Ref Range Status   Specimen Description BLOOD SITE NOT SPECIFIED  Final   Special Requests   Final    BOTTLES DRAWN AEROBIC AND ANAEROBIC Blood Culture adequate volume   Culture   Final    NO GROWTH < 24 HOURS Performed at [facility], [address], [place], [place] [address]    Report Status PENDING  Incomplete  SARS Coronavirus 2 by RT PCR (hospital order, performed in [facility] hospital lab) Nasopharyngeal Nasopharyngeal Swab     Status: None   Collection Time: 12/15/19 10:08 AM   Specimen: Nasopharyngeal Swab  Result Value Ref Range Status   SARS Coronavirus 2 NEGATIVE NEGATIVE Final    Comment: (NOTE) SARS-CoV-2 target nucleic acids are NOT DETECTED. The SARS-CoV-2 RNA is generally detectable in upper and lower respiratory specimens during the acute phase of infection.  The lowest concentration of SARS-CoV-2 viral copies this assay can detect is 250 copies / mL. A negative result does not preclude SARS-CoV-2 infection and should not be used as the sole basis for treatment or other patient management decisions.  A negative result may occur with improper specimen collection / handling, submission of specimen other than nasopharyngeal swab, presence of viral mutation(s) within the areas targeted by this assay, and inadequate number of viral copies (<250 copies / mL). A negative result must be combined with clinical observations, patient history, and epidemiological information. Fact Sheet for Patients:   StrictlyIdeas.no Fact Sheet for Healthcare Providers: BankingDealers.co.za This test is not yet approved or cleared  by the [place] FDA and has been authorized for detection and/or diagnosis of SARS-CoV-2 by FDA under an Emergency Use Authorization (EUA).  This EUA will remain in effect (meaning this test can be used) for the duration of the COVID-19 declaration under Section 564(b)(1) of the Act, 21 U.S.C. section 360bbb-3(b)(1), unless the authorization is terminated or revoked sooner. Performed at [facility], [place] [address], [place], [place] [address]   Blood Culture (routine x 2)     Status: None (Preliminary result)   Collection Time: 12/15/19 10:30 AM   Specimen: BLOOD  Result Value Ref Range Status   Specimen Description BLOOD SITE NOT SPECIFIED  Final   Special Requests   Final    BOTTLES DRAWN AEROBIC ONLY Blood Culture results may not be optimal due to an inadequate volume of blood received in culture bottles   Culture   Final    NO GROWTH < 24 HOURS Performed at [facility], [place] [address], [place], [place] [address]    Report Status PENDING  Incomplete     Imaging/Diagnostic Tests: CT Head Wo Contrast Result Date: 12/15/2019 IMPRESSION: Normal head CT.   CT CHEST WO  CONTRAST Result Date: 12/15/2019 IMPRESSION: 1. Bilateral lower lobe predominant streaky densities may represent atelectasis/scarring. Developing infiltrate is less likely but not excluded. Clinical correlation is recommended. 2. Small bilateral pleural effusions.  MR BRAIN W WO CONTRAST Result Date: 12/15/2019 IMPRESSION: Mildly motion degraded but MRI appearance of the brain appears stable since 2018 and normal for age.  DG Chest Port 1 View Result Date: 12/15/2019 IMPRESSION: Interval mild changes of congestive heart failure with a small, partially loculated right pleural effusion and small left pleural effusion.    EKG Interpretation  Date/Time:  Friday Dec 15 2019 09:52:52 EDT Ventricular Rate:  141 PR Interval:    QRS Duration: 88 QT Interval:  266 QTC Calculation: 408 R Axis:   31 Text Interpretation: Sinus tachycardia Since last tracing rate faster Confirmed by Noemi Chapel [phone]) on 12/15/2019 11:10:03 AM        Procedures:   Procedure Orders     Critical Care     EKG 12-Lead     ED EKG 12-Lead  Wilber Oliphant, MD 12/16/2019, 11:42 AM PGY-2, [facility] Intern pager: [phone], text pages welcome

## 2019-12-16 NOTE — Evaluation (Signed)
Clinical/Bedside Swallow Evaluation Patient Details  Name: Sheryl Campbell MRN: [id number] Date of Birth: 12-08-1960  Today's Date: 12/16/2019 Time: SLP Start Time (ACUTE ONLY): [id number] SLP Stop Time (ACUTE ONLY): 0946 SLP Time Calculation (min) (ACUTE ONLY): 21 min  Past Medical History:  Past Medical History:  Diagnosis Date  . Arthritis   . Bell's palsy   . Cancer ([place])    skin  . Fibromyalgia   . Intervertebral disk disease    "my whole spine"  . Osteopenia   . Sciatica   . Scoliosis    Past Surgical History:  Past Surgical History:  Procedure Laterality Date  . BACK SURGERY  05/2004   Harrington rods T3- T11  . BUNIONECTOMY Bilateral 07/2001  . CHOLECYSTECTOMY  02/2000  . NECK SURGERY  10/2002   cervical diskectomy, C5-C7  . NOSE SURGERY  07/1993   deviated septum   HPI:  59 y.o. female presenting with AMS, fever, and shaking. PMH is significant for Bell's palsy, trimalleolar fracture of right ankle, depression, fibromyalgia, osteopenia, sciatica, scoliosis, intervertebral disc disease, arthritis 12/15/19 CT chest indicated Bilateral lower lobe predominant streaky densities may represent atelectasis/scarring. Developing infiltrate is less likely but not excluded. Clinical correlation is recommended.  Assessment / Plan / Recommendation Clinical Impression  Pt with a normal oropharyngeal swallow given various consistencies including thin, puree and soft solids (d/t missing dentition); pt denies any dysphagia in past or present; strong cough noted and adequate coordination/propulsion with all consistencies; recommend continue Dysphagia 3/thin liquid diet with potential for progression of diet with ST f/u x1 for diet tolerance while pt is in house d/t CT results paired with new neurological symptoms; thank you for this consult. SLP Visit Diagnosis: Dysphagia, unspecified (R13.10)    Aspiration Risk  Mild aspiration risk    Diet Recommendation   Dysphagia 3/thin  liquids  Medication Administration: Whole meds with liquid    Other  Recommendations Oral Care Recommendations: Oral care BID   Follow up Recommendations None      Frequency and Duration min 1 x/week  1 week       Prognosis Prognosis for Safe Diet Advancement: Good      Swallow Study   General Date of Onset: 12/15/19 HPI: 59 y.o. female presenting with AMS, fever, and shaking. PMH is significant for Bell's palsy, trimalleolar fracture of right ankle, depression, fibromyalgia, osteopenia, sciatica, scoliosis, intervertebral disc disease, arthritis Type of Study: Bedside Swallow Evaluation Previous Swallow Assessment: (n/a) Diet Prior to this Study: Dysphagia 3 (soft);Thin liquids Temperature Spikes Noted: Yes(low grade in last 24 hrs) Respiratory Status: Nasal cannula History of Recent Intubation: No Behavior/Cognition: Alert;Cooperative Oral Cavity Assessment: Within Functional Limits Oral Care Completed by SLP: No Oral Cavity - Dentition: Missing dentition Vision: Functional for self-feeding Self-Feeding Abilities: Able to feed self Patient Positioning: Upright in bed Baseline Vocal Quality: Normal Volitional Cough: Strong Volitional Swallow: Able to elicit    Oral/Motor/Sensory Function Overall Oral Motor/Sensory Function: Within functional limits   Ice Chips Ice chips: Not tested   Thin Liquid Thin Liquid: Within functional limits Presentation: Straw    Nectar Thick Nectar Thick Liquid: Not tested   Honey Thick Honey Thick Liquid: Not tested   Puree Puree: Within functional limits Presentation: Self Fed   Solid     Solid: Within functional limits Presentation: Self Fed      Elvina Sidle, M.S., CCC-SLP 12/16/2019,12:40 PM

## 2019-12-16 NOTE — Evaluation (Addendum)
Physical Therapy Evaluation Patient Details Name: Sheryl Campbell MRN: [id number] DOB: 30-Nov-1960 Today's Date: 12/16/2019   History of Present Illness  59 y.o. female presenting with AMS, fever, and shaking. PMH is significant for Bell's palsy, trimalleolar fracture of right ankle, depression, fibromyalgia, osteopenia, sciatica, scoliosis, intervertebral disc disease, arthritis 12/15/19 CT chest indicated Bilateral lower lobe predominant streaky densities may represent atelectasis/scarring. Developing infiltrate is less likely but not excluded.   Clinical Impression  Pt admitted with above diagnosis. Pt was able to stand and pivot to the recliner with min guard assist and cues with RW.  Per husband and pt, pt was doing well at home awaiting the ankle surgery -ie she could use the knee walker in home without assist to move around and husband and other family helped with aDLS.  Husband reports they figure she just didn't take some medication correctly. Discussed ramp with husband as he currently carries pt intot home.  Husband states it is easier to carry pt.  Will follow acutely however not home equipment needs or f/u needs per pt and husband.  Pt currently with functional limitations due to the deficits listed below (see PT Problem List). Pt will benefit from skilled PT to increase their independence and safety with mobility to allow discharge to the venue listed below.      Follow Up Recommendations No PT follow up;Supervision - Intermittent(They decline HHPT)    Equipment Recommendations  None recommended by PT    Recommendations for Other Services       Precautions / Restrictions Precautions Precautions: Fall Restrictions Weight Bearing Restrictions: Yes RLE Weight Bearing: Non weight bearing      Mobility  Bed Mobility Overal bed mobility: Needs Assistance Bed Mobility: Supine to Sit     Supine to sit: Min assist     General bed mobility comments: assist to initiate  movement  Transfers Overall transfer level: Needs assistance Equipment used: Rolling walker (2 wheeled) Transfers: Sit to/from [facility] Sit to Stand: Min assist Stand pivot transfers: Min assist       General transfer comment: Pt needed min assist to power up wiht cues for hand placement. Needed min assist to use RW to stand and turn to the recliner.  Able to maintain NWB right LE.   Ambulation/Gait             General Gait Details: Pt not ambulating as sheuses knee walker at home vs crutches until her Ortho appt which was supposed to be yesterday  Stairs            Wheelchair Mobility    Modified Rankin (Stroke Patients Only)       Balance Overall balance assessment: Needs assistance Sitting-balance support: No upper extremity supported;Feet supported Sitting balance-Leahy Scale: Fair     Standing balance support: Bilateral upper extremity supported;During functional activity Standing balance-Leahy Scale: Poor Standing balance comment: relies on UE supporrt for balance                             Pertinent Vitals/Pain Pain Assessment: Faces Faces Pain Scale: Hurts whole lot Pain Location: right ankle Pain Descriptors / Indicators: Aching;Grimacing;Guarding Pain Intervention(s): Limited activity within patient's tolerance;Monitored during session;Repositioned;Premedicated before session    Home Living Family/patient expects to be discharged to:: Private residence Living Arrangements: Spouse/significant other Available Help at Discharge: Family;Available 24 hours/day(husband works 10 hour days M-F, pt alone people check in ) Type of Home: House  Home Access: Stairs to enter   [employer] of Steps: 2(husband carries pt in and out) Home Layout: One level Home Equipment: Transport chair;Crutches;Bedside commode(knee scooter)      Prior Function Level of Independence: Needs assistance   Gait / Transfers Assistance  Needed: Pt could use cruthces in house without assist.  Used knee walker without assist  Also used transport chair as well  ADL's / Homemaking Assistance Needed: Sponge bathing and dressing with assist.         Hand Dominance   Dominant Hand: Right    Extremity/Trunk Assessment   Upper Extremity Assessment Upper Extremity Assessment: Defer to OT evaluation    Lower Extremity Assessment Lower Extremity Assessment: RLE deficits/detail RLE Deficits / Details: NT due to splint in place    Cervical / Trunk Assessment Cervical / Trunk Assessment: Normal  Communication   Communication: No difficulties  Cognition Arousal/Alertness: Awake/alert Behavior During Therapy: WFL for tasks assessed/performed Overall Cognitive Status: Within Functional Limits for tasks assessed                                        General Comments      Exercises     Assessment/Plan    PT Assessment Patient needs continued PT services  PT Problem List Decreased activity tolerance;Decreased balance;Decreased mobility;Decreased knowledge of use of DME;Decreased safety awareness;Decreased knowledge of precautions;Pain;Decreased strength;Decreased range of motion       PT Treatment Interventions DME instruction;Gait training;Functional mobility training;Therapeutic activities;Therapeutic exercise;Balance training;Patient/family education    PT Goals (Current goals can be found in the Care Plan section)  Acute Rehab PT Goals Patient Stated Goal: to get home PT Goal Formulation: With patient Time For Goal Achievement: 12/30/19 Potential to Achieve Goals: Good    Frequency Min 3X/week   Barriers to discharge Decreased caregiver support      Co-evaluation               AM-PAC PT "6 Clicks" Mobility  Outcome Measure Help needed turning from your back to your side while in a flat bed without using bedrails?: None Help needed moving from lying on your back to sitting on the  side of a flat bed without using bedrails?: None Help needed moving to and from a bed to a chair (including a wheelchair)?: A Little Help needed standing up from a chair using your arms (e.g., wheelchair or bedside chair)?: A Little Help needed to walk in hospital room?: A Lot Help needed climbing 3-5 steps with a railing? : Total 6 Click Score: 17    End of Session Equipment Utilized During Treatment: Gait belt;Oxygen(Pt was on 2LO2 and desat to 86% RA  therefore replaced) Activity Tolerance: Patient tolerated treatment well Patient left: in chair;with call bell/phone within reach;with chair alarm set;with family/visitor present Nurse Communication: Mobility status PT Visit Diagnosis: Unsteadiness on feet (R26.81);Muscle weakness (generalized) (M62.81);Pain Pain - Right/Left: Right Pain - part of body: Ankle and joints of foot    Time: [id number] PT Time Calculation (min) (ACUTE ONLY): 25 min   Charges:   PT Evaluation $PT Eval Moderate Complexity: 1 Mod PT Treatments $Therapeutic Activity: 8-22 mins        Lynia Landry W,PT Acute Rehabilitation Services Pager:  [phone]  Office:  [place] 12/16/2019, 3:20 PM

## 2019-12-17 ENCOUNTER — Inpatient Hospital Stay (HOSPITAL_COMMUNITY): Payer: PPO

## 2019-12-17 DIAGNOSIS — S82891A Other fracture of right lower leg, initial encounter for closed fracture: Secondary | ICD-10-CM

## 2019-12-17 DIAGNOSIS — I361 Nonrheumatic tricuspid (valve) insufficiency: Secondary | ICD-10-CM

## 2019-12-17 DIAGNOSIS — J189 Pneumonia, unspecified organism: Secondary | ICD-10-CM

## 2019-12-17 DIAGNOSIS — S82891D Other fracture of right lower leg, subsequent encounter for closed fracture with routine healing: Secondary | ICD-10-CM

## 2019-12-17 LAB — COMPREHENSIVE METABOLIC PANEL
ALT: 21 U/L (ref 0–44)
AST: 33 U/L (ref 15–41)
Albumin: 2.7 g/dL — ABNORMAL LOW (ref 3.5–5.0)
Alkaline Phosphatase: 76 U/L (ref 38–126)
Anion gap: 15 (ref 5–15)
BUN: <5 mg/dL — ABNORMAL LOW (ref 6–20)
CO2: 28 mmol/L (ref 22–32)
Calcium: 8.6 mg/dL — ABNORMAL LOW (ref 8.9–10.3)
Chloride: 97 mmol/L — ABNORMAL LOW (ref 98–111)
Creatinine, Ser: 0.4 mg/dL — ABNORMAL LOW (ref 0.44–1.00)
GFR calc Af Amer: >60 mL/min (ref >60)
GFR calc non Af Amer: >60 mL/min (ref >60)
Glucose, Bld: 88 mg/dL (ref 70–99)
Potassium: 3.5 mmol/L (ref 3.5–5.1)
Sodium: 140 mmol/L (ref 135–145)
Total Bilirubin: 0.3 mg/dL (ref 0.3–1.2)
Total Protein: 6.7 g/dL (ref 6.5–8.1)

## 2019-12-17 LAB — URINE CULTURE: Culture: NO GROWTH

## 2019-12-17 LAB — ECHOCARDIOGRAM COMPLETE
Height: 62 in
Weight: 2048 oz

## 2019-12-17 LAB — RPR: RPR Ser Ql: NONREACTIVE

## 2019-12-17 LAB — CK: Total CK: 421 U/L — ABNORMAL HIGH (ref 38–234)

## 2019-12-17 LAB — MRSA PCR SCREENING: MRSA by PCR: NEGATIVE

## 2019-12-17 MED ORDER — DULOXETINE HCL 60 MG PO CPEP
60.0000 mg | ORAL_CAPSULE | Freq: Every day | ORAL | Status: DC
  Administered 2019-12-19: 60 mg via ORAL
  Filled 2019-12-17: qty 1

## 2019-12-17 MED ORDER — AZITHROMYCIN 500 MG PO TABS: 500.0000 mg | ORAL_TABLET | Freq: Every day | ORAL | Status: DC

## 2019-12-17 MED ORDER — AZITHROMYCIN 250 MG PO TABS
250.0000 mg | ORAL_TABLET | Freq: Every day | ORAL | Status: DC
  Administered 2019-12-19: 250 mg via ORAL
  Filled 2019-12-17: qty 1

## 2019-12-17 MED ORDER — AZITHROMYCIN 500 MG PO TABS
500.0000 mg | ORAL_TABLET | Freq: Every day | ORAL | Status: AC
  Administered 2019-12-17: 500 mg via ORAL
  Filled 2019-12-17: qty 1

## 2019-12-17 NOTE — Progress Notes (Signed)
Family Medicine Teaching Service Daily Progress Note Intern Pager: [phone]  Patient name: Sheryl Campbell Medical record number: [id number] Date of birth: 1960-08-27 Age: 59 y.o. Gender: female  Primary Care Provider: Leonard Downing, MD Consultants: Pharmacy  Code Status: Full Code   Pt Overview and Major Events to Date:  Hospital Day: 3 12/15/2019: admitted for acute AMS, myoclonus, code sepsis   Antibiotics  5/28 - CTX, azithro x1  Vancomycin (5/28-29)  Zosyn (5/28-29)   Assessment and Plan: Sheryl Campbell is a 59 y.o. female who presented w/ altered mental status, myoclonus, code sepsis.  PMHx s/f chronic pain syndrome, fibromyalgia, chronic opiate therapy, depression, Bell's palsy, recent trimalleolar right fracture, tobacco use (quit 4 weeks ago).  Altered mental status Myoclonus Most likely related to abilify use, cymbalta withdrawal, and/or baclofen withdrawal in setting of acute trauma from ankle fx. Patient has been on abilify for 20 years, has been using it in conjunction with cymbalta for approx 4 years. She ran out of cymbalta on 5/22 and had only taken 1 pill in that time. Patient presented with myoclonus, AMS, and unstable vital signs, which could have been d/t SSRI withdrawal syndrome vs NMS (abilify, baclofen). Patient greatly improved overnight (5/28) with cymbalta, abilify added 5/29. Pt declined HH PT. CK greatly reduced from 897 yesterday to 421 today. Other labs returned as normal: TSH 1.041, Vit B12. Hgb A1c with prediabetes at 5.8%. Blood and urine cultures are NG x2 days. Recommend outpatient referral to psychiatry for medication management. -Continue supportive care -Continue Cymbalta 60mg  -Continue Abilify -SLP continue dysphagia 3 diet - F/U psych op for medication management  New O2 status- resolved Patient satting well on RA. CT chest with atalectasis vs. Scarring. Infiltrate cannot be ruled out.  With patient's recent trimalleolar fracture, if  patient continues to have increased oxygen requirement, will obtain CTA to rule out pulmonary embolism. Patient does have chronic cough and has phlegm production. Will treat with azithromycin x3d. Unlikely to be pna given resolution of fever, no leukocytosis, procalcitonin.  -if worsening respiratory symptoms, get CTA to r/o PE -Azithromycin 500 mg x3d  CXR w/ cardiomegaly  Patient noted to have rales on lungs.  She is a longtime smoker, just recently quit 4 weeks ago and still occasionally has a cigarette. Awaiting echo today, though unlikely to be cause of this presentation. - F/u echo  Trimalleolar fracture, right Fracture 4 days ago s/p reduction in the ED.  Checked ankle last night on admission and without any erythema.  Patient not in any pain at this time. Will need to follow up with Sheryl Campbell  to discuss ORIF. - f/u ortho -NWB  SIRS criteria- resolved Patient presented with fever to 103, tachycardia, tachypnea.  No UTI, no obvious pneumonia, no erythema suggesting infection of right ankle fracture.  Procalcitonin 0.16 x2, making bacterial infection unlikely.   Fibromyalgia Chronic pain syndrome Spinal surgery Chronic opiate use Per PDMP, patient is prescribed monthly #180 Norco 10-325 tablets, #10 fentanyl patch.  Patient's UDS is negative on admission.  She does have a fentanyl patch present. -Continue fentanyl patch -Continue home Norco 10/325  Depression Patient has been on Abilify and duloxetine for several years each.  Unclear who started Abilify.  Patient reports not taking these medications over the last several days. -Increase duloxetine to 60 mg -Continue Abilify - referral to psychiatry for   Anemia Hemoglobin 9.4>8.5, likely iatrogenic.  No signs of bleeding.  Last hemoglobin in 2018 was 12.1.  We will  continue to monitor. - follow up outpatient  - AM CBC  FEN/GI:  . Fluids: Saline lock . Electrolytes: CMP pending . Nutrition: dysphagia 3    Access: L PIV AC  and forearm  VTE prophylaxis: Lovenox 40 (CrCl>30)  Disposition: When medically stable    Subjective:  NAEO. Feels much better, wishing to go home.  Objective: Temp:  [98 F (36.7 C)-98.7 F (37.1 C)] 98.7 F (37.1 C) (05/30 0801) Pulse Rate:  [86-93] 92 (05/30 0801) Cardiac Rhythm: Normal sinus rhythm (05/30 0742) Resp:  [16-19] 18 (05/30 0801) BP: (127-157)/(65-84) 153/84 (05/30 0801) SpO2:  [87 %-100 %] 94 % (05/30 0801) Weight:  [58.1 kg] 58.1 kg (05/30 0111) Intake/Output      05/29 0701 - 05/30 0700 05/30 0701 - 05/31 0700   P.O.     IV Piggyback     Total Intake(mL/kg)     Urine (mL/kg/hr) 2075 (1.5)    Total Output 2075    Net -2075             Physical Exam: Gen: NAD, alert, non-toxic, sitting comfortably, tired appearing. Skin: Warm and dry. No obvious rashes, lesions, or trauma. HEENT: normocephalic, atraumatic. EOMI, PERRLA. MMM. Neck: Soft, supple. No LAD. No JVD. CV: RRR.  Normal S1, S2. No m/r/g. No BLEE. Resp: CTAB.  No w/r/r.  No increased WOB Abd: +BS. NTND on palpation. Psych: Cooperative. Makes eye contact. Speech normal.  Extremities: Moves extremities spontaneously. Extremities warm and well perfused.  Neuro: CN II-XII grossly intact. No FNDs. 2+ reflexes.    Laboratory: I have personally read and reviewed all labs and imaging studies.   CBC: Recent Labs  Lab December 26, 2019 1000 12/16/19 0745  WBC 10.6* 8.0  NEUTROABS 8.1*  --   HGB 9.4* 8.5*  HCT 31.7* 28.7*  MCV 86.1 87.0  PLT 382 321   CMP: Recent Labs  Lab 2019-12-26 1000 12/16/19 0745  NA 138 136  K 4.4 3.7  CL 102 98  CO2 26 26  GLUCOSE 113* 84  BUN 9 9  CREATININE 0.53 0.47  CALCIUM 8.7* 8.7*  ALBUMIN 3.1* 2.8*   Micro: Covid Negative  Recent Results (from the past 240 hour(s))  Blood Culture (routine x 2)     Status: None (Preliminary result)   Collection Time: 12-26-19 10:00 AM   Specimen: BLOOD  Result Value Ref Range Status   Specimen Description BLOOD SITE NOT  SPECIFIED  Final   Special Requests   Final    BOTTLES DRAWN AEROBIC AND ANAEROBIC Blood Culture adequate volume   Culture   Final    NO GROWTH 2 DAYS Performed at [facility], [address], [place], [place] [address]    Report Status PENDING  Incomplete  SARS Coronavirus 2 by RT PCR (hospital order, performed in [facility] hospital lab) Nasopharyngeal Nasopharyngeal Swab     Status: None   Collection Time: 12/26/19 10:08 AM   Specimen: Nasopharyngeal Swab  Result Value Ref Range Status   SARS Coronavirus 2 NEGATIVE NEGATIVE Final    Comment: (NOTE) SARS-CoV-2 target nucleic acids are NOT DETECTED. The SARS-CoV-2 RNA is generally detectable in upper and lower respiratory specimens during the acute phase of infection. The lowest concentration of SARS-CoV-2 viral copies this assay can detect is 250 copies / mL. A negative result does not preclude SARS-CoV-2 infection and should not be used as the sole basis for treatment or other patient management decisions.  A negative result may occur with improper  specimen collection / handling, submission of specimen other than nasopharyngeal swab, presence of viral mutation(s) within the areas targeted by this assay, and inadequate number of viral copies (<250 copies / mL). A negative result must be combined with clinical observations, patient history, and epidemiological information. Fact Sheet for Patients:   StrictlyIdeas.no Fact Sheet for Healthcare Providers: BankingDealers.co.za This test is not yet approved or cleared  by the [place] FDA and has been authorized for detection and/or diagnosis of SARS-CoV-2 by FDA under an Emergency Use Authorization (EUA).  This EUA will remain in effect (meaning this test can be used) for the duration of the COVID-19 declaration under Section 564(b)(1) of the Act, 21 U.S.C. section 360bbb-3(b)(1), unless the authorization is terminated  or revoked sooner. Performed at [facility], [place] [address], [place], [place] [address]   Blood Culture (routine x 2)     Status: None (Preliminary result)   Collection Time: 12/15/19 10:30 AM   Specimen: BLOOD  Result Value Ref Range Status   Specimen Description BLOOD SITE NOT SPECIFIED  Final   Special Requests   Final    BOTTLES DRAWN AEROBIC ONLY Blood Culture results may not be optimal due to an inadequate volume of blood received in culture bottles   Culture   Final    NO GROWTH 2 DAYS Performed at [facility], [place] [address], [place], [place] [address]    Report Status PENDING  Incomplete  Urine culture     Status: None   Collection Time: 12/15/19 10:30 AM   Specimen: In/Out Cath Urine  Result Value Ref Range Status   Specimen Description IN/OUT CATH URINE  Final   Special Requests ADDED ON 12/15/19 AT 2204  Final   Culture   Final    NO GROWTH Performed at [facility], [place] [address], [place], [place] [address]    Report Status 12/17/2019 FINAL  Final     Imaging/Diagnostic Tests: CT Head Wo Contrast Result Date: 12/15/2019 IMPRESSION: Normal head CT.   CT CHEST WO CONTRAST Result Date: 12/15/2019 IMPRESSION: 1. Bilateral lower lobe predominant streaky densities may represent atelectasis/scarring. Developing infiltrate is less likely but not excluded. Clinical correlation is recommended. 2. Small bilateral pleural effusions.  MR BRAIN W WO CONTRAST Result Date: 12/15/2019 IMPRESSION: Mildly motion degraded but MRI appearance of the brain appears stable since 2018 and normal for age.  DG Chest Port 1 View Result Date: 12/15/2019 IMPRESSION: Interval mild changes of congestive heart failure with a small, partially loculated right pleural effusion and small left pleural effusion.    EKG Interpretation  Date/Time:  Friday Dec 15 2019 09:52:52 EDT Ventricular Rate:  141 PR Interval:    QRS Duration: 88 QT Interval:  266 QTC Calculation: 408 R  Axis:   31 Text Interpretation: Sinus tachycardia Since last tracing rate faster Confirmed by Noemi Chapel [phone]) on 12/15/2019 11:10:03 AM        Procedures:   Procedure Orders     Critical Care     EKG 12-Lead     ED EKG 12-Lead  Gladys Damme, MD 12/17/2019, 8:04 AM PGY-1, [place] Intern pager: [phone], text pages welcome

## 2019-12-17 NOTE — Progress Notes (Signed)
Discussed case with Ortho PA, [employer]. Patient scheduled for ORIF tomorrow AM at 7:30. Holding lovenox tonight, last dose 5/29 at 1800. NPO at midnight tonight.  Gladys Damme, MD [facility], PGY-1

## 2019-12-17 NOTE — Progress Notes (Signed)
  Echocardiogram 2D Echocardiogram has been performed.  Sheryl Campbell 12/17/2019, 3:56 PM

## 2019-12-17 NOTE — Plan of Care (Signed)

## 2019-12-17 NOTE — Anesthesia Preprocedure Evaluation (Addendum)
Anesthesia Evaluation  Patient identified by MRN, date of birth, ID band Patient awake    Reviewed: Allergy & Precautions, NPO status , Patient's Chart, lab work & pertinent test results  History of Anesthesia Complications Negative for: history of anesthetic complications  Airway Mallampati: II  TM Distance: >3 FB Neck ROM: Full    Dental  (+) Dental Advisory Given, Edentulous Upper, Partial Lower,    Pulmonary Current Smoker and Patient abstained from smoking.,    Pulmonary exam normal        Cardiovascular negative cardio ROS Normal cardiovascular exam     Neuro/Psych  Bell's palsy  negative psych ROS   GI/Hepatic negative GI ROS, Neg liver ROS,   Endo/Other  negative endocrine ROS  Renal/GU negative Renal ROS     Musculoskeletal  (+) Arthritis , Fibromyalgia - Scoliosis    Abdominal   Peds  Hematology  (+) anemia ,   Anesthesia Other Findings Covid neg 5/28  Reproductive/Obstetrics                            Anesthesia Physical Anesthesia Plan  ASA: III  Anesthesia Plan: General   Post-op Pain Management:  Regional for Post-op pain   Induction: Intravenous  PONV Risk Score and Plan: 3 and Treatment may vary due to age or medical condition, Ondansetron, Dexamethasone and Midazolam  Airway Management Planned: Oral ETT  Additional Equipment: None  Intra-op Plan:   Post-operative Plan: Extubation in OR  Informed Consent: I have reviewed the patients History and Physical, chart, labs and discussed the procedure including the risks, benefits and alternatives for the proposed anesthesia with the patient or authorized representative who has indicated his/her understanding and acceptance.     Dental advisory given  Plan Discussed with: CRNA and Anesthesiologist  Anesthesia Plan Comments:        Anesthesia Quick Evaluation

## 2019-12-17 NOTE — Hospital Course (Signed)
     F/U Psychotropic medications: needs medical management by psychiatrist Pre-diabetes: Hgb A1c 5.8%, needs f/u with PCP

## 2019-12-18 ENCOUNTER — Encounter (HOSPITAL_COMMUNITY)
Admission: EM | Disposition: A | Discharge status: Home / Self Care | Payer: Self-pay | Source: Home / Self Care | Attending: Family Medicine

## 2019-12-18 ENCOUNTER — Inpatient Hospital Stay (HOSPITAL_COMMUNITY): Payer: PPO | Admitting: Anesthesiology

## 2019-12-18 ENCOUNTER — Inpatient Hospital Stay (HOSPITAL_COMMUNITY): Payer: PPO

## 2019-12-18 DIAGNOSIS — S82891A Other fracture of right lower leg, initial encounter for closed fracture: Secondary | ICD-10-CM

## 2019-12-18 HISTORY — PX: ORIF ANKLE FRACTURE: SHX5408

## 2019-12-18 LAB — CBC
HCT: 30.5 % — ABNORMAL LOW (ref 36.0–46.0)
Hemoglobin: 9.5 g/dL — ABNORMAL LOW (ref 12.0–15.0)
MCH: 25.6 pg — ABNORMAL LOW (ref 26.0–34.0)
MCHC: 31.1 g/dL (ref 30.0–36.0)
MCV: 82.2 fL (ref 80.0–100.0)
Platelets: 388 K/uL (ref 150–400)
RBC: 3.71 MIL/uL — ABNORMAL LOW (ref 3.87–5.11)
RDW: 15.9 % — ABNORMAL HIGH (ref 11.5–15.5)
WBC: 5.6 K/uL (ref 4.0–10.5)
nRBC: 0 % (ref 0.0–0.2)

## 2019-12-18 LAB — BASIC METABOLIC PANEL
Anion gap: 11 (ref 5–15)
BUN: <5 mg/dL — ABNORMAL LOW (ref 6–20)
CO2: 29 mmol/L (ref 22–32)
Calcium: 8.8 mg/dL — ABNORMAL LOW (ref 8.9–10.3)
Chloride: 99 mmol/L (ref 98–111)
Creatinine, Ser: 0.43 mg/dL — ABNORMAL LOW (ref 0.44–1.00)
GFR calc Af Amer: >60 mL/min (ref >60)
GFR calc non Af Amer: >60 mL/min (ref >60)
Glucose, Bld: 94 mg/dL (ref 70–99)
Potassium: 3 mmol/L — ABNORMAL LOW (ref 3.5–5.1)
Sodium: 139 mmol/L (ref 135–145)

## 2019-12-18 LAB — CK: Total CK: 213 U/L (ref 38–234)

## 2019-12-18 SURGERY — OPEN REDUCTION INTERNAL FIXATION (ORIF) ANKLE FRACTURE
Anesthesia: General | Site: Ankle | Laterality: Right

## 2019-12-18 MED ORDER — PROPOFOL 10 MG/ML IV BOLUS
INTRAVENOUS | Status: DC | PRN
  Administered 2019-12-18: 100 mg via INTRAVENOUS

## 2019-12-18 MED ORDER — ACETAMINOPHEN 500 MG PO TABS
1000.0000 mg | ORAL_TABLET | Freq: Four times a day (QID) | ORAL | Status: AC
  Administered 2019-12-18 – 2019-12-19 (×4): 1000 mg via ORAL
  Filled 2019-12-18 (×4): qty 2

## 2019-12-18 MED ORDER — GLYCOPYRROLATE PF 0.2 MG/ML IJ SOSY
PREFILLED_SYRINGE | INTRAMUSCULAR | Status: DC | PRN
  Administered 2019-12-18: .2 mg via INTRAVENOUS

## 2019-12-18 MED ORDER — DOCUSATE SODIUM 100 MG PO CAPS
100.0000 mg | ORAL_CAPSULE | Freq: Two times a day (BID) | ORAL | Status: DC
  Administered 2019-12-18 – 2019-12-19 (×3): 100 mg via ORAL
  Filled 2019-12-18 (×3): qty 1

## 2019-12-18 MED ORDER — POVIDONE-IODINE 10 % EX SWAB: 2.0000 "application " | Freq: Once | CUTANEOUS | Status: DC

## 2019-12-18 MED ORDER — OXYCODONE HCL 5 MG PO TABS: 5.0000 mg | ORAL_TABLET | Freq: Once | ORAL | Status: DC | PRN

## 2019-12-18 MED ORDER — LIDOCAINE 2% (20 MG/ML) 5 ML SYRINGE
INTRAMUSCULAR | Status: DC | PRN
  Administered 2019-12-18: 40 mg via INTRAVENOUS

## 2019-12-18 MED ORDER — MIDAZOLAM HCL 2 MG/2ML IJ SOLN
INTRAMUSCULAR | Status: AC
  Filled 2019-12-18: qty 2

## 2019-12-18 MED ORDER — HYDROMORPHONE HCL 1 MG/ML IJ SOLN: 0.5000 mg | INTRAMUSCULAR | Status: DC | PRN

## 2019-12-18 MED ORDER — METOCLOPRAMIDE HCL 5 MG PO TABS: 5.0000 mg | ORAL_TABLET | Freq: Three times a day (TID) | ORAL | Status: DC | PRN

## 2019-12-18 MED ORDER — ONDANSETRON HCL 4 MG/2ML IJ SOLN: 4.0000 mg | Freq: Once | INTRAMUSCULAR | Status: DC | PRN

## 2019-12-18 MED ORDER — MIDAZOLAM HCL 5 MG/5ML IJ SOLN
INTRAMUSCULAR | Status: DC | PRN
  Administered 2019-12-18: 2 mg via INTRAVENOUS

## 2019-12-18 MED ORDER — FENTANYL CITRATE (PF) 250 MCG/5ML IJ SOLN
INTRAMUSCULAR | Status: AC
  Filled 2019-12-18: qty 5

## 2019-12-18 MED ORDER — METHOCARBAMOL 500 MG PO TABS
500.0000 mg | ORAL_TABLET | Freq: Four times a day (QID) | ORAL | Status: DC | PRN
  Administered 2019-12-18 – 2019-12-19 (×3): 500 mg via ORAL
  Filled 2019-12-18 (×3): qty 1

## 2019-12-18 MED ORDER — ROPIVACAINE HCL 5 MG/ML IJ SOLN
INTRAMUSCULAR | Status: DC | PRN
  Administered 2019-12-18: 30 mL via EPIDURAL
  Administered 2019-12-18: 20 mL via EPIDURAL

## 2019-12-18 MED ORDER — FENTANYL CITRATE (PF) 100 MCG/2ML IJ SOLN: 25.0000 ug | INTRAMUSCULAR | Status: DC | PRN

## 2019-12-18 MED ORDER — OXYCODONE HCL 5 MG/5ML PO SOLN: 5.0000 mg | Freq: Once | ORAL | Status: DC | PRN

## 2019-12-18 MED ORDER — DEXAMETHASONE SODIUM PHOSPHATE 10 MG/ML IJ SOLN
INTRAMUSCULAR | Status: AC
  Filled 2019-12-18: qty 1

## 2019-12-18 MED ORDER — VANCOMYCIN HCL 1000 MG IV SOLR
INTRAVENOUS | Status: DC | PRN
  Administered 2019-12-18: 1000 mg

## 2019-12-18 MED ORDER — ROCURONIUM BROMIDE 10 MG/ML (PF) SYRINGE
PREFILLED_SYRINGE | INTRAVENOUS | Status: AC
  Filled 2019-12-18: qty 10

## 2019-12-18 MED ORDER — BUPIVACAINE HCL (PF) 0.25 % IJ SOLN
INTRAMUSCULAR | Status: AC
  Filled 2019-12-18: qty 30

## 2019-12-18 MED ORDER — SUGAMMADEX SODIUM 200 MG/2ML IV SOLN
INTRAVENOUS | Status: DC | PRN
  Administered 2019-12-18: 106 mg via INTRAVENOUS

## 2019-12-18 MED ORDER — ONDANSETRON HCL 4 MG/2ML IJ SOLN
INTRAMUSCULAR | Status: DC | PRN
  Administered 2019-12-18: 4 mg via INTRAVENOUS

## 2019-12-18 MED ORDER — CEFAZOLIN SODIUM-DEXTROSE 1-4 GM/50ML-% IV SOLN
1.0000 g | Freq: Three times a day (TID) | INTRAVENOUS | Status: AC
  Administered 2019-12-18 – 2019-12-19 (×2): 1 g via INTRAVENOUS
  Filled 2019-12-18 (×3): qty 50

## 2019-12-18 MED ORDER — ACETAMINOPHEN 325 MG PO TABS: 325.0000 mg | ORAL_TABLET | Freq: Four times a day (QID) | ORAL | Status: DC | PRN

## 2019-12-18 MED ORDER — METOCLOPRAMIDE HCL 5 MG/ML IJ SOLN: 5.0000 mg | Freq: Three times a day (TID) | INTRAMUSCULAR | Status: DC | PRN

## 2019-12-18 MED ORDER — POTASSIUM CHLORIDE CRYS ER 20 MEQ PO TBCR
40.0000 meq | EXTENDED_RELEASE_TABLET | ORAL | Status: AC
  Administered 2019-12-18 (×2): 40 meq via ORAL
  Filled 2019-12-18 (×2): qty 2

## 2019-12-18 MED ORDER — PHENYLEPHRINE HCL-NACL 10-0.9 MG/250ML-% IV SOLN
INTRAVENOUS | Status: DC | PRN
  Administered 2019-12-18: 25 ug/min via INTRAVENOUS

## 2019-12-18 MED ORDER — 0.9 % SODIUM CHLORIDE (POUR BTL) OPTIME
TOPICAL | Status: DC | PRN
  Administered 2019-12-18: 1000 mL

## 2019-12-18 MED ORDER — PHENYLEPHRINE HCL (PRESSORS) 10 MG/ML IV SOLN
INTRAVENOUS | Status: DC | PRN
  Administered 2019-12-18 (×2): 80 ug via INTRAVENOUS
  Administered 2019-12-18: 40 ug via INTRAVENOUS

## 2019-12-18 MED ORDER — GLYCOPYRROLATE PF 0.2 MG/ML IJ SOSY
PREFILLED_SYRINGE | INTRAMUSCULAR | Status: AC
  Filled 2019-12-18: qty 1

## 2019-12-18 MED ORDER — CHLORHEXIDINE GLUCONATE 4 % EX LIQD
60.0000 mL | Freq: Once | CUTANEOUS | Status: AC
  Administered 2019-12-18: 4 via TOPICAL
  Filled 2019-12-18: qty 60

## 2019-12-18 MED ORDER — VANCOMYCIN HCL 1000 MG IV SOLR
INTRAVENOUS | Status: AC
  Filled 2019-12-18: qty 1000

## 2019-12-18 MED ORDER — ONDANSETRON HCL 4 MG/2ML IJ SOLN: 4.0000 mg | Freq: Four times a day (QID) | INTRAMUSCULAR | Status: DC | PRN

## 2019-12-18 MED ORDER — OXYCODONE HCL 5 MG PO TABS
5.0000 mg | ORAL_TABLET | ORAL | Status: DC | PRN
  Administered 2019-12-18 – 2019-12-19 (×3): 10 mg via ORAL
  Filled 2019-12-18 (×3): qty 2

## 2019-12-18 MED ORDER — PHENYLEPHRINE 40 MCG/ML (10ML) SYRINGE FOR IV PUSH (FOR BLOOD PRESSURE SUPPORT)
PREFILLED_SYRINGE | INTRAVENOUS | Status: AC
  Filled 2019-12-18: qty 10

## 2019-12-18 MED ORDER — DEXAMETHASONE SODIUM PHOSPHATE 10 MG/ML IJ SOLN
INTRAMUSCULAR | Status: DC | PRN
  Administered 2019-12-18: 10 mg via INTRAVENOUS

## 2019-12-18 MED ORDER — ROCURONIUM BROMIDE 10 MG/ML (PF) SYRINGE
PREFILLED_SYRINGE | INTRAVENOUS | Status: DC | PRN
  Administered 2019-12-18: 50 mg via INTRAVENOUS

## 2019-12-18 MED ORDER — FENTANYL CITRATE (PF) 100 MCG/2ML IJ SOLN
INTRAMUSCULAR | Status: DC | PRN
  Administered 2019-12-18 (×2): 50 ug via INTRAVENOUS
  Administered 2019-12-18: 25 ug via INTRAVENOUS
  Administered 2019-12-18 (×2): 50 ug via INTRAVENOUS
  Administered 2019-12-18: 25 ug via INTRAVENOUS

## 2019-12-18 MED ORDER — METHOCARBAMOL 1000 MG/10ML IJ SOLN
500.0000 mg | Freq: Four times a day (QID) | INTRAVENOUS | Status: DC | PRN
  Filled 2019-12-18: qty 5

## 2019-12-18 MED ORDER — LACTATED RINGERS IV SOLN: INTRAVENOUS | Status: DC | PRN

## 2019-12-18 MED ORDER — LIDOCAINE 2% (20 MG/ML) 5 ML SYRINGE
INTRAMUSCULAR | Status: AC
  Filled 2019-12-18: qty 5

## 2019-12-18 MED ORDER — CEFAZOLIN SODIUM-DEXTROSE 2-4 GM/100ML-% IV SOLN
2.0000 g | INTRAVENOUS | Status: AC
  Administered 2019-12-18: 2 g via INTRAVENOUS
  Filled 2019-12-18: qty 100

## 2019-12-18 MED ORDER — ASPIRIN 81 MG PO CHEW
81.0000 mg | CHEWABLE_TABLET | Freq: Two times a day (BID) | ORAL | Status: DC
  Administered 2019-12-18 – 2019-12-19 (×3): 81 mg via ORAL
  Filled 2019-12-18 (×3): qty 1

## 2019-12-18 MED ORDER — ONDANSETRON HCL 4 MG/2ML IJ SOLN
INTRAMUSCULAR | Status: AC
  Filled 2019-12-18: qty 2

## 2019-12-18 MED ORDER — ONDANSETRON HCL 4 MG PO TABS: 4.0000 mg | ORAL_TABLET | Freq: Four times a day (QID) | ORAL | Status: DC | PRN

## 2019-12-18 MED ORDER — PROPOFOL 10 MG/ML IV BOLUS
INTRAVENOUS | Status: AC
  Filled 2019-12-18: qty 20

## 2019-12-18 SURGICAL SUPPLY — 78 items
BIT DRILL 2.5X2.75 QC CALB (BIT) ×3 IMPLANT
BIT DRILL 2.9 CANN QC NONSTRL (BIT) ×3 IMPLANT
BIT DRILL 3.5X5.5 QC CALB (BIT) ×3 IMPLANT
BLADE SURG 10 STRL SS (BLADE) IMPLANT
BNDG COHESIVE 6X5 TAN STRL LF (GAUZE/BANDAGES/DRESSINGS) IMPLANT
BNDG ELASTIC 4X5.8 VLCR STR LF (GAUZE/BANDAGES/DRESSINGS) ×3 IMPLANT
BNDG ELASTIC 6X10 VLCR STRL LF (GAUZE/BANDAGES/DRESSINGS) ×3 IMPLANT
BNDG ESMARK 4X9 LF (GAUZE/BANDAGES/DRESSINGS) ×3 IMPLANT
BNDG GAUZE ELAST 4 BULKY (GAUZE/BANDAGES/DRESSINGS) ×3 IMPLANT
COVER MAYO STAND STRL (DRAPES) IMPLANT
COVER SURGICAL LIGHT HANDLE (MISCELLANEOUS) ×3 IMPLANT
COVER WAND RF STERILE (DRAPES) ×3 IMPLANT
CUFF TOURN SGL QUICK 34 (TOURNIQUET CUFF)
CUFF TOURN SGL QUICK 42 (TOURNIQUET CUFF) IMPLANT
CUFF TRNQT CYL 34X4.125X (TOURNIQUET CUFF) IMPLANT
DRAPE C-ARM 42X72 X-RAY (DRAPES) ×3 IMPLANT
DRAPE INCISE IOBAN 66X45 STRL (DRAPES) IMPLANT
DRAPE SURG 17X23 STRL (DRAPES) ×3 IMPLANT
DRAPE U-SHAPE 47X51 STRL (DRAPES) ×3 IMPLANT
DRSG AQUACEL AG ADV 3.5X 4 (GAUZE/BANDAGES/DRESSINGS) ×3 IMPLANT
DRSG AQUACEL AG ADV 3.5X 6 (GAUZE/BANDAGES/DRESSINGS) ×3 IMPLANT
DRSG PAD ABDOMINAL 8X10 ST (GAUZE/BANDAGES/DRESSINGS) ×6 IMPLANT
DURAPREP 26ML APPLICATOR (WOUND CARE) IMPLANT
ELECT CAUTERY BLADE 6.4 (BLADE) ×3 IMPLANT
ELECT REM PT RETURN 9FT ADLT (ELECTROSURGICAL) ×3
ELECTRODE REM PT RTRN 9FT ADLT (ELECTROSURGICAL) ×1 IMPLANT
GAUZE SPONGE 4X4 12PLY STRL (GAUZE/BANDAGES/DRESSINGS) ×3 IMPLANT
GAUZE XEROFORM 5X9 LF (GAUZE/BANDAGES/DRESSINGS) ×3 IMPLANT
GLOVE BIOGEL PI IND STRL 7.0 (GLOVE) ×1 IMPLANT
GLOVE BIOGEL PI IND STRL 8 (GLOVE) ×1 IMPLANT
GLOVE BIOGEL PI INDICATOR 7.0 (GLOVE) ×2
GLOVE BIOGEL PI INDICATOR 8 (GLOVE) ×2
GLOVE ECLIPSE 7.0 STRL STRAW (GLOVE) ×3 IMPLANT
GLOVE ECLIPSE 8.0 STRL XLNG CF (GLOVE) ×3 IMPLANT
GOWN STRL REUS W/ TWL LRG LVL3 (GOWN DISPOSABLE) ×3 IMPLANT
GOWN STRL REUS W/TWL LRG LVL3 (GOWN DISPOSABLE) ×9
HANDPIECE INTERPULSE COAX TIP (DISPOSABLE)
K-WIRE ACE 1.6X6 (WIRE) ×6
KIT BASIN OR (CUSTOM PROCEDURE TRAY) ×3 IMPLANT
KIT TURNOVER KIT B (KITS) ×3 IMPLANT
KWIRE ACE 1.6X6 (WIRE) ×2 IMPLANT
MANIFOLD NEPTUNE II (INSTRUMENTS) ×3 IMPLANT
NEEDLE HYPO 25GX1X1/2 BEV (NEEDLE) ×3 IMPLANT
NS IRRIG 1000ML POUR BTL (IV SOLUTION) ×3 IMPLANT
PACK ORTHO EXTREMITY (CUSTOM PROCEDURE TRAY) ×3 IMPLANT
PAD ABD 8X10 STRL (GAUZE/BANDAGES/DRESSINGS) ×12 IMPLANT
PAD ARMBOARD 7.5X6 YLW CONV (MISCELLANEOUS) ×6 IMPLANT
PAD CAST 4YDX4 CTTN HI CHSV (CAST SUPPLIES) ×1 IMPLANT
PADDING CAST COTTON 4X4 STRL (CAST SUPPLIES) ×3
PLATE LOCK 4H 109 RT DIST FIB (Plate) ×3 IMPLANT
SCREW ACE CAN 4.0 40M (Screw) ×6 IMPLANT
SCREW CORTICAL 3.5MM 22MM (Screw) ×3 IMPLANT
SCREW LOCK CORT STAR 3.5X10 (Screw) ×3 IMPLANT
SCREW LOCK CORT STAR 3.5X12 (Screw) ×12 IMPLANT
SCREW LOCK CORT STAR 3.5X14 (Screw) ×3 IMPLANT
SCREW LOW PROFILE 12MMX3.5MM (Screw) ×6 IMPLANT
SCREW NON LOCKING LP 3.5 14MM (Screw) ×3 IMPLANT
SCREW NON LOCKING LP 3.5 16MM (Screw) ×3 IMPLANT
SET HNDPC FAN SPRY TIP SCT (DISPOSABLE) IMPLANT
SPLINT PLASTER EXTRA FAST 3X15 (CAST SUPPLIES) ×2
SPLINT PLASTER GYPS XFAST 3X15 (CAST SUPPLIES) ×1 IMPLANT
STOCKINETTE IMPERVIOUS 9X36 MD (GAUZE/BANDAGES/DRESSINGS) IMPLANT
SUCTION FRAZIER HANDLE 10FR (MISCELLANEOUS) ×3
SUCTION TUBE FRAZIER 10FR DISP (MISCELLANEOUS) ×1 IMPLANT
SUT ETHILON 3 0 PS 1 (SUTURE) ×12 IMPLANT
SUT MNCRL AB 3-0 PS2 18 (SUTURE) IMPLANT
SUT VIC AB 2-0 CT1 27 (SUTURE) ×6
SUT VIC AB 2-0 CT1 TAPERPNT 27 (SUTURE) ×2 IMPLANT
SUT VIC AB 2-0 CTB1 (SUTURE) ×3 IMPLANT
SUT VIC AB 3-0 SH 27 (SUTURE) ×3
SUT VIC AB 3-0 SH 27X BRD (SUTURE) ×1 IMPLANT
SYR CONTROL 10ML LL (SYRINGE) ×3 IMPLANT
TOWEL GREEN STERILE (TOWEL DISPOSABLE) ×3 IMPLANT
TOWEL GREEN STERILE FF (TOWEL DISPOSABLE) ×3 IMPLANT
TUBE CONNECTING 12'X1/4 (SUCTIONS) ×1
TUBE CONNECTING 12X1/4 (SUCTIONS) ×2 IMPLANT
WATER STERILE IRR 1000ML POUR (IV SOLUTION) ×3 IMPLANT
YANKAUER SUCT BULB TIP NO VENT (SUCTIONS) IMPLANT

## 2019-12-18 NOTE — Anesthesia Postprocedure Evaluation (Signed)
Anesthesia Post Note  Patient: Sheryl Campbell  Procedure(s) Performed: OPEN REDUCTION INTERNAL FIXATION RIGHT ANKLE FRACTURE (Right Ankle)     Patient location during evaluation: PACU Anesthesia Type: General Level of consciousness: awake and alert Pain management: pain level controlled Vital Signs Assessment: post-procedure vital signs reviewed and stable Respiratory status: spontaneous breathing, nonlabored ventilation and respiratory function stable Cardiovascular status: blood pressure returned to baseline and stable Postop Assessment: no apparent nausea or vomiting Anesthetic complications: no    Last Vitals:  Vitals:   12/18/19 1039 12/18/19 1108  BP: 135/74 (!) 148/88  Pulse: 77 80  Resp: (!) 24 20  Temp: 36.4 C 36.6 C  SpO2: 92% 96%    Last Pain:  Vitals:   12/18/19 1108  TempSrc: Oral  PainSc: 0-No pain                 Audry Pili

## 2019-12-18 NOTE — Progress Notes (Signed)
SLP Cancellation Note  Patient Details Name: Sheryl Campbell MRN: [id number] DOB: 20-Mar-1961   Cancelled treatment:       Reason Eval/Treat Not Completed: Patient at procedure or test/unavailable   Saniya Tranchina, Katherene Ponto 12/18/2019, 7:16 AM

## 2019-12-18 NOTE — Progress Notes (Signed)
Family Medicine [facility] Daily Progress Note Intern Pager: [phone]  Patient name: Sheryl Campbell Medical record number: [id number] Date of birth: 1960/10/29 Age: 59 y.o. Gender: female  Primary Care Provider: Leonard Downing, MD Consultants: Pharmacy  Code Status: Full Code   Pt Overview and Major Events to Date:  Hospital Day: 4 12/15/2019: admitted for acute AMS, myoclonus, code sepsis   Antibiotics  5/28 - CTX, azithro x1  Vancomycin (5/28-29)  Zosyn (5/28-29)   Assessment and Plan: Sheryl Campbell is a 59 y.o. female who presented w/ altered mental status, myoclonus, code sepsis.  PMHx s/f chronic pain syndrome, fibromyalgia, chronic opiate therapy, depression, Bell's palsy, recent trimalleolar right fracture, tobacco use (quit 4 weeks ago).  Trimalleolar fracture, right Fracture 5 days ago s/p reduction in the ED. Going for ORIF this morning with Dr. Marlou Sa. NPO since midnight, lovenox held. Likely d/c home after surgery pending ortho recommendations. - f/u ortho recommendations  Altered mental status  Myoclonus - resolved Patient has returned to baseline mental status, myoclonus gone. Most likely related to abilify use, cymbalta withdrawal, and/or baclofen withdrawal in setting of acute trauma from ankle fx. Recommend outpatient referral to psychiatry for medication management. -Continue supportive care -Continue Cymbalta 60mg  -Continue Abilify -SLP continue dysphagia 3 diet - F/U psych op for medication management  New O2 status- resolved Patient satting well on RA. CT chest with atalectasis vs. Scarring. Infiltrate cannot be ruled out. Patient does have chronic cough and has phlegm production. Will treat with azithromycin x5d. Unlikely to be pna given resolution of fever, no leukocytosis, procalcitonin.  -Azithromycin 500 mg x5d, to end 6/3  CXR w/ cardiomegaly  Patient noted to have chronic smoker's cough at admission with CXR indicating small b/l  pleural effusions and concern for CHF. She is a longtime smoker, just recently quit 4 weeks ago and still occasionally has a cigarette. Echo with preserved EF 60-65%, G1DD, mild aortic valve sclerosis. Overall reassuring.  SIRS criteria- resolved Patient presented with fever to 103, tachycardia, tachypnea.  No UTI, no obvious pneumonia, no erythema suggesting infection of right ankle fracture.  Procalcitonin 0.16 x2, making bacterial infection unlikely.   Fibromyalgia Chronic pain syndrome Spinal surgery Chronic opiate use Per PDMP, patient is prescribed monthly #180 Norco 10-325 tablets, #10 fentanyl patch.  Patient's UDS is negative on admission.  She does have a fentanyl patch present. -Continue fentanyl patch -Continue home Norco 10/325  Depression Patient has been on Abilify and duloxetine for several years each.  Unclear who started Abilify.  Patient reports not taking these medications over the last several days. -Increase duloxetine to 60 mg -Continue Abilify - referral to psychiatry for med management  Anemia Hemoglobin 9.4>8.5>9.5, likely iatrogenic.  No signs of bleeding.  Last hemoglobin in 2018 was 12.1.  We will continue to monitor. - follow up outpatient  - AM CBC  FEN/GI:  . Fluids: Saline lock . Electrolytes: CMP pending . Nutrition: dysphagia 3    Access: L PIV AC and forearm  VTE prophylaxis: Lovenox 40 (CrCl>30)  Disposition: When medically stable    Subjective:  NAEO.   Objective: Temp:  [97.3 F (36.3 C)-98.9 F (37.2 C)] 98.1 F (36.7 C) (05/31 0514) Pulse Rate:  [85-94] 85 (05/31 0514) Cardiac Rhythm: Normal sinus rhythm (05/30 1900) Resp:  [16-22] 18 (05/31 0514) BP: (131-153)/(72-90) 151/85 (05/31 0514) SpO2:  [94 %-100 %] 95 % (05/31 0514) Weight:  [52.3 kg] 52.3 kg (05/31 0500) Intake/Output      05/30  0701 - 05/31 0700   P.O. 480   Total Intake(mL/kg) 480 (9.2)   Urine (mL/kg/hr) 800 (0.6)   Stool 0   Total Output 800   Net -320        Urine Occurrence 1 x   Stool Occurrence 1 x       Physical Exam: Gen: NAD, alert, non-toxic, lying comfortably, tired appearing. Skin: Warm and dry. No obvious rashes, lesions, or trauma. HEENT: normocephalic, atraumatic. EOMI, PERRLA. MMM. Neck: Soft, supple. No LAD. No JVD. CV: RRR.  Normal S1, S2. No m/r/g. No BLEE. Resp: CTAB.  No w/r/r.  No increased WOB Abd: +BS. NTND on palpation. Psych: Cooperative. Makes eye contact. Speech normal.  Extremities: Moves extremities spontaneously. Extremities warm and well perfused.  Neuro: CN II-XII grossly intact. No FNDs. No myoclonus   Laboratory: I have personally read and reviewed all labs and imaging studies.   CBC: Recent Labs  Lab 12-21-2019 1000 12/16/19 0745 12/18/19 0444  WBC 10.6* 8.0 5.6  NEUTROABS 8.1*  --   --   HGB 9.4* 8.5* 9.5*  HCT 31.7* 28.7* 30.5*  MCV 86.1 87.0 82.2  PLT 382 321 388   CMP: Recent Labs  Lab 2019-12-21 1000 12/16/19 0745 12/17/19 0816  NA 138 136 140  K 4.4 3.7 3.5  CL 102 98 97*  CO2 26 26 28   GLUCOSE 113* 84 88  BUN 9 9 <5*  CREATININE 0.53 0.47 0.40*  CALCIUM 8.7* 8.7* 8.6*  ALBUMIN 3.1* 2.8* 2.7*   Micro: Covid Negative  Recent Results (from the past 240 hour(s))  Blood Culture (routine x 2)     Status: None (Preliminary result)   Collection Time: 12-21-2019 10:00 AM   Specimen: BLOOD  Result Value Ref Range Status   Specimen Description BLOOD SITE NOT SPECIFIED  Final   Special Requests   Final    BOTTLES DRAWN AEROBIC AND ANAEROBIC Blood Culture adequate volume   Culture   Final    NO GROWTH 2 DAYS Performed at [facility], [address] [place], [place] [address]    Report Status PENDING  Incomplete  SARS Coronavirus 2 by RT PCR (hospital order, performed in [facility] hospital lab) Nasopharyngeal Nasopharyngeal Swab     Status: None   Collection Time: 12-21-19 10:08 AM   Specimen: Nasopharyngeal Swab  Result Value Ref Range Status   SARS Coronavirus 2  NEGATIVE NEGATIVE Final    Comment: (NOTE) SARS-CoV-2 target nucleic acids are NOT DETECTED. The SARS-CoV-2 RNA is generally detectable in upper and lower respiratory specimens during the acute phase of infection. The lowest concentration of SARS-CoV-2 viral copies this assay can detect is 250 copies / mL. A negative result does not preclude SARS-CoV-2 infection and should not be used as the sole basis for treatment or other patient management decisions.  A negative result may occur with improper specimen collection / handling, submission of specimen other than nasopharyngeal swab, presence of viral mutation(s) within the areas targeted by this assay, and inadequate number of viral copies (<250 copies / mL). A negative result must be combined with clinical observations, patient history, and epidemiological information. Fact Sheet for Patients:   StrictlyIdeas.no Fact Sheet for Healthcare Providers: BankingDealers.co.za This test is not yet approved or cleared  by the [place] FDA and has been authorized for detection and/or diagnosis of SARS-CoV-2 by FDA under an Emergency Use Authorization (EUA).  This EUA will remain in effect (meaning this test can be used) for  the duration of the COVID-19 declaration under Section 564(b)(1) of the Act, 21 U.S.C. section 360bbb-3(b)(1), unless the authorization is terminated or revoked sooner. Performed at [facility], [place] [address] [place], [place] [address]   Blood Culture (routine x 2)     Status: None (Preliminary result)   Collection Time: 12/15/19 10:30 AM   Specimen: BLOOD  Result Value Ref Range Status   Specimen Description BLOOD SITE NOT SPECIFIED  Final   Special Requests   Final    BOTTLES DRAWN AEROBIC ONLY Blood Culture results may not be optimal due to an inadequate volume of blood received in culture bottles   Culture   Final    NO GROWTH 2 DAYS Performed at [facility], [place] [address] [place], [place] [address]    Report Status PENDING  Incomplete  Urine culture     Status: None   Collection Time: 12/15/19 10:30 AM   Specimen: In/Out Cath Urine  Result Value Ref Range Status   Specimen Description IN/OUT CATH URINE  Final   Special Requests ADDED ON 12/15/19 AT 2204  Final   Culture   Final    NO GROWTH Performed at [facility], [place] [address] [place], [place] [address]    Report Status 12/17/2019 FINAL  Final  MRSA PCR Screening     Status: None   Collection Time: 12/17/19  8:46 PM   Specimen: Nasal Mucosa; Nasopharyngeal  Result Value Ref Range Status   MRSA by PCR NEGATIVE NEGATIVE Final    Comment:        The GeneXpert MRSA Assay (FDA approved for NASAL specimens only), is one component of a comprehensive MRSA colonization surveillance program. It is not intended to diagnose MRSA infection nor to guide or monitor treatment for MRSA infections. Performed at [facility], [place] [address], [place], [place] [address]      Imaging/Diagnostic Tests: CT Head Wo Contrast Result Date: 12/15/2019 IMPRESSION: Normal head CT.   CT CHEST WO CONTRAST Result Date: 12/15/2019 IMPRESSION: 1. Bilateral lower lobe predominant streaky densities may represent atelectasis/scarring. Developing infiltrate is less likely but not excluded. Clinical correlation is recommended. 2. Small bilateral pleural effusions.  MR BRAIN W WO CONTRAST Result Date: 12/15/2019 IMPRESSION: Mildly motion degraded but MRI appearance of the brain appears stable since 2018 and normal for age.  DG Chest Port 1 View Result Date: 12/15/2019 IMPRESSION: Interval mild changes of congestive heart failure with a small, partially loculated right pleural effusion and small left pleural effusion.    EKG Interpretation  Date/Time:  Friday Dec 15 2019 09:52:52 EDT Ventricular Rate:  141 PR Interval:    QRS Duration: 88 QT Interval:  266 QTC Calculation: 408 R  Axis:   31 Text Interpretation: Sinus tachycardia Since last tracing rate faster Confirmed by Noemi Chapel [phone]) on 12/15/2019 11:10:03 AM        Procedures:   Procedure Orders     Critical Care     EKG 12-Lead     ED EKG 12-Lead     ECHOCARDIOGRAM COMPLETE  Gladys Damme, MD 12/18/2019, 6:29 AM PGY-1, [place] Intern pager: [phone], text pages welcome

## 2019-12-18 NOTE — Transfer of Care (Signed)
Immediate Anesthesia Transfer of Care Note  Patient: Sheryl Campbell  Procedure(s) Performed: OPEN REDUCTION INTERNAL FIXATION RIGHT ANKLE FRACTURE (Right Ankle)  Patient Location: PACU  Anesthesia Type:General  Level of Consciousness: awake, alert  and oriented  Airway & Oxygen Therapy: Patient Spontanous Breathing and Patient connected to nasal cannula oxygen  Post-op Assessment: Report given to RN, Post -op Vital signs reviewed and stable and Patient moving all extremities X 4  Post vital signs: Reviewed and stable  Last Vitals:  Vitals Value Taken Time  BP 141/74 12/18/19 1021  Temp    Pulse 79 12/18/19 1022  Resp 23 12/18/19 1021  SpO2 99 % 12/18/19 1022  Vitals shown include unvalidated device data.  Last Pain:  Vitals:   12/18/19 0514  TempSrc: Oral  PainSc:          Complications: No apparent anesthesia complications

## 2019-12-18 NOTE — Progress Notes (Addendum)
Received patient from PACU. Patient alert and oriented x4. No complaints of pain at this time. Right ankle splinted and wrapped with ace bandage- clean, dry, and intact. Patient's call bell within reach and instructed to utilize when she needs assistance. Will continue to monitor.

## 2019-12-18 NOTE — Progress Notes (Signed)
     Subjective: Day of Surgery Procedure(s) (LRB): OPEN REDUCTION INTERNAL FIXATION RIGHT ANKLE FRACTURE (Right) Awake, alert and oriented x 4. Right short leg splint intact, no drainage. Nursing called and requested a plan for her care. She has been an inpatient since 5/28 awaiting ORIF right ankle bimalleolar ankle fx, now immediately post op she has not been seen by PT and evaluated for her capacity to stand and walk. Aspirin for anti DVT prophylaxis is appropriate. Due to swelling pre and post op may not be ready for Discharge.  Patient reports pain as moderate.    Objective:   VITALS:  Temp:  [97.3 F (36.3 C)-98.9 F (37.2 C)] 97.8 F (36.6 C) (05/31 1108) Pulse Rate:  [77-94] 80 (05/31 1108) Resp:  [8-24] 20 (05/31 1108) BP: (131-151)/(72-88) 148/88 (05/31 1108) SpO2:  [92 %-99 %] 96 % (05/31 1108) Weight:  [52.3 kg] 52.3 kg (05/31 0500)  Neurologically intact ABD soft Neurovascular intact Sensation intact distally Intact pulses distally Dorsiflexion/Plantar flexion intact Incision: dressing C/D/I and no drainage No cellulitis present   LABS Recent Labs    12/16/19 0745 12/18/19 0444  HGB 8.5* 9.5*  WBC 8.0 5.6  PLT 321 388   Recent Labs    12/17/19 0816 12/18/19 0444  NA 140 139  K 3.5 3.0*  CL 97* 99  CO2 28 29  BUN <5* <5*  CREATININE 0.40* 0.43*  GLUCOSE 88 94   No results for input(s): LABPT, INR in the last 72 hours.   Assessment/Plan: Day of Surgery Procedure(s) (LRB): OPEN REDUCTION INTERNAL FIXATION RIGHT ANKLE FRACTURE (Right)  Advance diet Up with therapy D/C IV fluids  PT eval and treat.   Basil Dess 12/18/2019, 1:59 PMPatient ID: Sheryl Campbell, female   DOB: [birth year], 59 y.o.   MRN: [id number]

## 2019-12-18 NOTE — Progress Notes (Signed)
PT Cancellation Note  Patient Details Name: BATOOL REASONOVER MRN: [id number] DOB: 1960-09-01   Cancelled Treatment:    Reason Eval/Treat Not Completed: Patient at procedure or test/unavailable  Pt off floor for ankle ORIF.  Will f/u as able. Maggie Font, PT Acute Rehab Services Pager [phone] [facility] [phone]  Karlton Lemon 12/18/2019, 10:36 AM

## 2019-12-18 NOTE — Brief Op Note (Signed)
   12/18/2019  10:18 AM  PATIENT:  Sheryl Campbell  59 y.o. female  PRE-OPERATIVE DIAGNOSIS:  RIGHT ANKLE FRACTURE  POST-OPERATIVE DIAGNOSIS:  RIGHT ANKLE FRACTURE  PROCEDURE:  Procedure(s): OPEN REDUCTION INTERNAL FIXATION RIGHT ANKLE FRACTURE  SURGEON:  Surgeon(s): Marlou Sa, Tonna Corner, MD  ASSISTANT: magnant pa  ANESTHESIA:   general  EBL: 20 ml    Total I/O In: 1277.8 [I.V.:1177.8; IV Piggyback:100] Out: 50 [Blood:50]  BLOOD ADMINISTERED: none  DRAINS: none   LOCAL MEDICATIONS USED:  none  SPECIMEN:  No Specimen  COUNTS:  YES  TOURNIQUET:  * Missing tourniquet times found for documented tourniquets in log: [id number] *  DICTATION: .Other Dictation: Dictation Number [id number]  PLAN OF CARE: Admit to inpatient   PATIENT DISPOSITION:  PACU - hemodynamically stable

## 2019-12-18 NOTE — Progress Notes (Signed)
OT Cancellation Note  Patient Details Name: JACINDA GASKELL MRN: [id number] DOB: 07/08/1961   Cancelled Treatment:    Reason Eval/Treat Not Completed: Patient at procedure or test/ unavailable(Pt off floor for ankle ORIF. OT to continue to follow.)   Jefferey Pica, OTR/L Acute Rehabilitation Services Pager: [phone] Office: [phone]   Treniyah Lynn C 12/18/2019, 11:53 AM

## 2019-12-18 NOTE — Discharge Summary (Addendum)
[facility] Discharge Summary  Patient name: Sheryl Campbell Medical record number: [id number] Date of birth: 10/22/1960 Age: 59 y.o. Gender: female Date of Admission: 12/15/2019  Date of Discharge: 12/19/19 Admitting Physician: Wilber Oliphant, MD  Primary Care Provider: Leonard Downing, MD Consultants: Orthopedic surgery  Indication for Hospitalization: AMS/Sepsis  Discharge Diagnoses/Problem List:  Active Problems:   Altered mental status   Atypical pneumonia   Closed fracture of right ankle  Disposition: Home  Discharge Condition: Stable  Discharge Exam:  General: Alert and oriented in no apparent distress Heart: Regular rate and rhythm with no murmurs appreciated Lungs: CTA bilaterally, no wheezing Skin: Warm and dry Extremities: Right lower leg in surgical cast with good cap refill of right toes and no signs of bleeding or drainage visible around bandage.  Brief Hospital Course:  AMS  Myoclonus  SIRS criteria Patient presented with concern for sepsis as pt had fever, diaphoresis, AMS, tachycardia, tachypnea, and myoclonus. Please see H&P for full details. However, no clear source of infection and patient's lab values were very normal: no metabolic derangements or left shift. The only lab value that was abnormal was CK, elevated four fold. Most likely related to abilify use, cymbalta withdrawal, and/or baclofen withdrawal in setting of acute trauma from ankle fx. Patient has been on abilify for 20 years, has been using it in conjunction with cymbalta for approx 4 years. She ran out of cymbalta on 5/22 and had only taken 1 pill in that time. Patient presented with myoclonus, AMS, and unstable vital signs, which could have been d/t SSRI withdrawal syndrome vs NMS (abilify, baclofen). Patient greatly improved overnight (5/28) with cymbalta, abilify added 5/29. Pt declined HH PT. CK greatly reduced to 421. Other labs returned as normal: TSH 1.041, Vit  B12. Hgb A1c with prediabetes at 5.8%. Blood and urine cultures are NG x2 days. Patient was discharged with final 2 days of azithromycin to be completed.  Trimalleolar Fracture: ORIF occurred via orthopedic surgery on [id number] without complication. After surgery patient was cleared from ortho standpoint to dc home.  Patient's fentanyl patches were reduced to 50 mcg per patch prior to discharge as there was concern that high doses of narcotics could cause her to be an increased fall risk.   Issues for Follow Up:  1. Patient with some elevated blood pressure during hospitalization, not on antihypertensives, PCP consider initiate antihypertensives if appropriate. 2. Fentanyl patch is reduced to 50 mcg per patch from 100 mcg per patch prior to discharge due to concern for patient being increased fall risk after her surgical ORIF. 3. Recommend outpatient referral to psychiatry for medication management. 4. Patient discharged with last 2 days of azithromycin to be completed  Significant Procedures: Status post ORIF  Significant Labs and Imaging:  Recent Labs  Lab 12/16/19 0745 12/18/19 0444 12/19/19 0158  WBC 8.0 5.6 10.1  HGB 8.5* 9.5* 9.4*  HCT 28.7* 30.5* 30.3*  PLT 321 388 444*   Recent Labs  Lab 12/15/19 1000 12/15/19 1000 12/16/19 0745 12/16/19 0745 12/17/19 0816 12/17/19 0816 12/18/19 0444 12/19/19 0158  NA 138  --  136  --  140  --  139 140  K 4.4   < > 3.7   < > 3.5   < > 3.0* 3.7  CL 102  --  98  --  97*  --  99 103  CO2 26  --  26  --  28  --  29 26  GLUCOSE 113*  --  84  --  88  --  94 87  BUN 9  --  9  --  <5*  --  <5* 7  CREATININE 0.53  --  0.47  --  0.40*  --  0.43* 0.48  CALCIUM 8.7*  --  8.7*  --  8.6*  --  8.8* 8.8*  MG  --   --   --   --   --   --   --  1.8  ALKPHOS 79  --  91  --  76  --   --   --   AST 19  --  46*  --  33  --   --   --   ALT 16  --  23  --  21  --   --   --   ALBUMIN 3.1*  --  2.8*  --  2.7*  --   --   --    < > = values in this interval  not displayed.   Results/Tests Pending at Time of Discharge: None  Discharge Medications:  Allergies as of 12/19/2019   No Known Allergies     Medication List    STOP taking these medications   fentaNYL 100 MCG/HR Commonly known as: DURAGESIC     TAKE these medications   ARIPiprazole 2 MG tablet Commonly known as: ABILIFY Take 2 mg by mouth daily.   aspirin 81 MG chewable tablet Chew 1 tablet (81 mg total) by mouth 2 (two) times daily.   azithromycin 250 MG tablet Commonly known as: ZITHROMAX Take 1 tablet (250 mg total) by mouth daily for 2 days. Start taking on: December 20, 2019   baclofen 10 MG tablet Commonly known as: LIORESAL Take 1 tablet by mouth every 6 (six) hours as needed for pain.   diclofenac Sodium 1 % Gel Commonly known as: VOLTAREN Apply 1 application topically 4 (four) times daily as needed for pain.   DULoxetine 60 MG capsule Commonly known as: CYMBALTA Take 60 mg by mouth daily.   gabapentin 300 MG capsule Commonly known as: NEURONTIN Take 300 mg by mouth 4 (four) times daily.   HYDROcodone-acetaminophen 10-325 MG tablet Commonly known as: NORCO Take 1 tablet by mouth 2 (two) times daily as needed for moderate pain.   MULTIVITAMIN ADULT PO Take 1 tablet by mouth daily.   POTASSIUM PO Take 1 tablet by mouth daily.   QC TUMERIC COMPLEX PO Take 1 tablet by mouth daily.       Discharge Instructions: Please refer to Patient Instructions section of EMR for full details.  Patient was counseled important signs and symptoms that should prompt return to medical care, changes in medications, dietary instructions, activity restrictions, and follow up appointments.   Follow-Up Appointments:   [name], DO 12/19/2019, 3:00 PM PGY-1, [place]

## 2019-12-18 NOTE — Consult Note (Signed)
Reason for Consult: Right ankle pain Referring Physician: Dr. Georgiana Shore is an 59 y.o. female.  HPI:  Sheryl Campbell is a 59 year old patient injured her right ankle several days ago.  This was a trimalleolar ankle fracture dislocation which was reduced.  She has since been in the hospital for medical management issues.  Presents now for operative management of her unstable ankle fracture.   Past Medical History:  Diagnosis Date  . Arthritis   . Bell's palsy   . Cancer ([place])    skin  . Fibromyalgia   . Intervertebral disk disease    "my whole spine"  . Osteopenia   . Sciatica   . Scoliosis     Past Surgical History:  Procedure Laterality Date  . BACK SURGERY  05/2004   Harrington rods T3- T11  . BUNIONECTOMY Bilateral 07/2001  . CHOLECYSTECTOMY  02/2000  . NECK SURGERY  10/2002   cervical diskectomy, C5-C7  . NOSE SURGERY  07/1993   deviated septum    Family History  Problem Relation Age of Onset  . Cancer - Lung Mother   . Diabetes Sister   . Diabetes Brother     Social History:  reports that she has been smoking. She has been smoking about 1.00 pack per day. She has never used smokeless tobacco. She reports that she does not drink alcohol or use drugs.  Allergies: No Known Allergies  Medications: I have reviewed the patient's current medications.  Results for orders placed or performed during the hospital encounter of 12/15/19 (from the past 48 hour(s))  TSH     Status: None   Collection Time: 12/16/19  7:45 AM  Result Value Ref Range   TSH 1.041 0.350 - 4.500 uIU/mL    Comment: Performed by a 3rd Generation assay with a functional sensitivity of <=0.01 uIU/mL. Performed at [facility], [place] [address], [place], [place] [address]   Vitamin B12     Status: None   Collection Time: 12/16/19  7:45 AM  Result Value Ref Range   Vitamin B-12 582 180 - 914 pg/mL    Comment: (NOTE) This assay is not validated for testing neonatal or myeloproliferative  syndrome specimens for Vitamin B12 levels. Performed at [facility], [place] [address] [place], [place] [address]   RPR     Status: None   Collection Time: 12/16/19  7:45 AM  Result Value Ref Range   RPR Ser Ql NON REACTIVE NON REACTIVE    Comment: Performed at [facility], [place] [address], [place], [place] [address]  Comprehensive metabolic panel     Status: Abnormal   Collection Time: 12/16/19  7:45 AM  Result Value Ref Range   Sodium 136 135 - 145 mmol/L   Potassium 3.7 3.5 - 5.1 mmol/L   Chloride 98 98 - 111 mmol/L   CO2 26 22 - 32 mmol/L   Glucose, Bld 84 70 - 99 mg/dL    Comment: Glucose reference range applies only to samples taken after fasting for at least 8 hours.   BUN 9 6 - 20 mg/dL   Creatinine, Ser 0.47 0.44 - 1.00 mg/dL   Calcium 8.7 (L) 8.9 - 10.3 mg/dL   Total Protein 6.9 6.5 - 8.1 g/dL   Albumin 2.8 (L) 3.5 - 5.0 g/dL   AST 46 (H) 15 - 41 U/L   ALT 23 0 - 44 U/L   Alkaline Phosphatase 91 38 - 126 U/L   Total  Bilirubin 0.6 0.3 - 1.2 mg/dL   GFR calc non Af Amer >60 >60 mL/min   GFR calc Af Amer >60 >60 mL/min   Anion gap 12 5 - 15    Comment: Performed at [facility] [address] [place], [place] [address]  CBC     Status: Abnormal   Collection Time: 12/16/19  7:45 AM  Result Value Ref Range   WBC 8.0 4.0 - 10.5 K/uL   RBC 3.30 (L) 3.87 - 5.11 MIL/uL   Hemoglobin 8.5 (L) 12.0 - 15.0 g/dL   HCT 28.7 (L) 36.0 - 46.0 %   MCV 87.0 80.0 - 100.0 fL   MCH 25.8 (L) 26.0 - 34.0 pg   MCHC 29.6 (L) 30.0 - 36.0 g/dL   RDW 16.1 (H) 11.5 - 15.5 %   Platelets 321 150 - 400 K/uL   nRBC 0.0 0.0 - 0.2 %    Comment: Performed at [facility] [address] [place], [place] [address]  Procalcitonin     Status: None   Collection Time: 12/16/19  7:45 AM  Result Value Ref Range   Procalcitonin 0.16 ng/mL    Comment:        Interpretation: PCT (Procalcitonin) <= 0.5 ng/mL: Systemic infection (sepsis) is not likely. Local bacterial infection  is possible. (NOTE)       Sepsis PCT Algorithm           Lower Respiratory Tract                                      Infection PCT Algorithm    ----------------------------     ----------------------------         PCT < 0.25 ng/mL                PCT < 0.10 ng/mL         Strongly encourage             Strongly discourage   discontinuation of antibiotics    initiation of antibiotics    ----------------------------     -----------------------------       PCT 0.25 - 0.50 ng/mL            PCT 0.10 - 0.25 ng/mL               OR       >80% decrease in PCT            Discourage initiation of                                            antibiotics      Encourage discontinuation           of antibiotics    ----------------------------     -----------------------------         PCT >= 0.50 ng/mL              PCT 0.26 - 0.50 ng/mL               AND        <80% decrease in PCT             Encourage initiation of  antibiotics       Encourage continuation           of antibiotics    ----------------------------     -----------------------------        PCT >= 0.50 ng/mL                  PCT > 0.50 ng/mL               AND         increase in PCT                  Strongly encourage                                      initiation of antibiotics    Strongly encourage escalation           of antibiotics                                     -----------------------------                                           PCT <= 0.25 ng/mL                                                 OR                                        > 80% decrease in PCT                                     Discontinue / Do not initiate                                             antibiotics Performed at [facility], [address] [place], [place] [address]   CK     Status: Abnormal   Collection Time: 12/16/19  9:02 AM  Result Value Ref Range   Total CK 897 (H) 38 - 234 U/L     Comment: Performed at [facility], [place] [address], [place], [place] [address]  Hemoglobin A1c     Status: Abnormal   Collection Time: 12/16/19  9:10 AM  Result Value Ref Range   Hgb A1c MFr Bld 5.8 (H) 4.8 - 5.6 %    Comment: (NOTE) Pre diabetes:          5.7%-6.4% Diabetes:              >6.4% Glycemic control for   <7.0% adults with diabetes    Mean Plasma Glucose 119.76 mg/dL    Comment: Performed at [facility] [address], [place],  [address]  Comprehensive metabolic panel     Status: Abnormal  Collection Time: 12/17/19  8:16 AM  Result Value Ref Range   Sodium 140 135 - 145 mmol/L   Potassium 3.5 3.5 - 5.1 mmol/L   Chloride 97 (L) 98 - 111 mmol/L   CO2 28 22 - 32 mmol/L   Glucose, Bld 88 70 - 99 mg/dL    Comment: Glucose reference range applies only to samples taken after fasting for at least 8 hours.   BUN <5 (L) 6 - 20 mg/dL   Creatinine, Ser 0.40 (L) 0.44 - 1.00 mg/dL   Calcium 8.6 (L) 8.9 - 10.3 mg/dL   Total Protein 6.7 6.5 - 8.1 g/dL   Albumin 2.7 (L) 3.5 - 5.0 g/dL   AST 33 15 - 41 U/L   ALT 21 0 - 44 U/L   Alkaline Phosphatase 76 38 - 126 U/L   Total Bilirubin 0.3 0.3 - 1.2 mg/dL   GFR calc non Af Amer >60 >60 mL/min   GFR calc Af Amer >60 >60 mL/min   Anion gap 15 5 - 15    Comment: Performed at [facility], [place] [address], [place], [place] [address]  CK     Status: Abnormal   Collection Time: 12/17/19  8:16 AM  Result Value Ref Range   Total CK 421 (H) 38 - 234 U/L    Comment: Performed at [facility], [place] [address], [place], [place] [address]  MRSA PCR Screening     Status: None   Collection Time: 12/17/19  8:46 PM   Specimen: Nasal Mucosa; Nasopharyngeal  Result Value Ref Range   MRSA by PCR NEGATIVE NEGATIVE    Comment:        The GeneXpert MRSA Assay (FDA approved for NASAL specimens only), is one component of a comprehensive MRSA colonization surveillance program. It is not intended to diagnose  MRSA infection nor to guide or monitor treatment for MRSA infections. Performed at [facility], [place] [address] [place], [place] [address]   CBC     Status: Abnormal   Collection Time: 12/18/19  4:44 AM  Result Value Ref Range   WBC 5.6 4.0 - 10.5 K/uL   RBC 3.71 (L) 3.87 - 5.11 MIL/uL   Hemoglobin 9.5 (L) 12.0 - 15.0 g/dL   HCT 30.5 (L) 36.0 - 46.0 %   MCV 82.2 80.0 - 100.0 fL   MCH 25.6 (L) 26.0 - 34.0 pg   MCHC 31.1 30.0 - 36.0 g/dL   RDW 15.9 (H) 11.5 - 15.5 %   Platelets 388 150 - 400 K/uL   nRBC 0.0 0.0 - 0.2 %    Comment: Performed at [facility], [place] [address] [place], [place] [address]  CK     Status: None   Collection Time: 12/18/19  4:44 AM  Result Value Ref Range   Total CK 213 38 - 234 U/L    Comment: Performed at [facility], [place] [address], [place],  [id number]  Basic metabolic panel     Status: Abnormal   Collection Time: 12/18/19  4:44 AM  Result Value Ref Range   Sodium 139 135 - 145 mmol/L   Potassium 3.0 (L) 3.5 - 5.1 mmol/L   Chloride 99 98 - 111 mmol/L   CO2 29 22 - 32 mmol/L   Glucose, Bld 94 70 - 99 mg/dL    Comment: Glucose reference range applies only to samples taken after fasting for at least 8 hours.   BUN <  5 (L) 6 - 20 mg/dL   Creatinine, Ser 0.43 (L) 0.44 - 1.00 mg/dL   Calcium 8.8 (L) 8.9 - 10.3 mg/dL   GFR calc non Af Amer >60 >60 mL/min   GFR calc Af Amer >60 >60 mL/min   Anion gap 11 5 - 15    Comment: Performed at [facility] [address], [place], [place] [address]    ECHOCARDIOGRAM COMPLETE  Result Date: 12/17/2019    ECHOCARDIOGRAM REPORT   Patient Name:   Sheryl Campbell Date of Exam: 12/17/2019 Medical Rec #:  [id number]        Height:       62.0 in Accession #:    [id number]       Weight:       128.0 lb Date of Birth:  06-29-1961         BSA:          1.581 m Patient Age:    7 years         BP:           153/79 mmHg Patient Gender: F                HR:           90 bpm. Exam Location:   Inpatient Procedure: 2D Echo, Cardiac Doppler and Color Doppler Indications:    Congestive heart failure  History:        Patient has prior history of Echocardiogram examinations, most                 recent 09/22/2016. Risk Factors:Current Smoker.  Sonographer:    Clayton Lefort RDCS (AE) Referring Phys: [id number] Phill Myron ENIOLA IMPRESSIONS  1. Left ventricular ejection fraction, by estimation, is 60 to 65%. The left ventricle has normal function. The left ventricle has no regional wall motion abnormalities. There is mild left ventricular hypertrophy. Left ventricular diastolic parameters are consistent with Grade I diastolic dysfunction (impaired relaxation).  2. Right ventricular systolic function is normal. The right ventricular size is normal. Tricuspid regurgitation signal is inadequate for assessing PA pressure.  3. The mitral valve is grossly normal. Trivial mitral valve regurgitation.  4. The aortic valve is tricuspid. Aortic valve regurgitation is not visualized. Mild aortic valve sclerosis is present, with no evidence of aortic valve stenosis.  5. The inferior vena cava is normal in size with <50% respiratory variability, suggesting right atrial pressure of 8 mmHg. FINDINGS  Left Ventricle: Left ventricular ejection fraction, by estimation, is 60 to 65%. The left ventricle has normal function. The left ventricle has no regional wall motion abnormalities. The left ventricular internal cavity size was normal in size. There is  mild left ventricular hypertrophy. Left ventricular diastolic parameters are consistent with Grade I diastolic dysfunction (impaired relaxation). Right Ventricle: The right ventricular size is normal. No increase in right ventricular wall thickness. Right ventricular systolic function is normal. Tricuspid regurgitation signal is inadequate for assessing PA pressure. Left Atrium: Left atrial size was normal in size. Right Atrium: Right atrial size was normal in size. Pericardium: There is no  evidence of pericardial effusion. Mitral Valve: The mitral valve is grossly normal. Trivial mitral valve regurgitation. MV peak gradient, 4.0 mmHg. The mean mitral valve gradient is 2.0 mmHg. Tricuspid Valve: The tricuspid valve is grossly normal. Tricuspid valve regurgitation is mild. Aortic Valve: The aortic valve is tricuspid. Aortic valve regurgitation is not visualized. Mild aortic valve sclerosis is present, with no evidence  of aortic valve stenosis. Mild aortic valve annular calcification. Aortic valve mean gradient measures 5.0  mmHg. Aortic valve peak gradient measures 11.6 mmHg. Aortic valve area, by VTI measures 2.06 cm. Pulmonic Valve: The pulmonic valve was grossly normal. Pulmonic valve regurgitation is trivial. Aorta: The aortic root is normal in size and structure. Venous: The inferior vena cava is normal in size with less than 50% respiratory variability, suggesting right atrial pressure of 8 mmHg. IAS/Shunts: No atrial level shunt detected by color flow Doppler.  LEFT VENTRICLE PLAX 2D LVIDd:         4.30 cm  Diastology LVIDs:         2.70 cm  LV e' lateral:   8.27 cm/s LV PW:         1.30 cm  LV E/e' lateral: 9.2 LV IVS:        1.00 cm  LV e' medial:    7.07 cm/s LVOT diam:     1.80 cm  LV E/e' medial:  10.7 LV SV:         59 LV SV Index:   37 LVOT Area:     2.54 cm  RIGHT VENTRICLE             IVC RV Basal diam:  2.90 cm     IVC diam: 1.60 cm RV S prime:     12.40 cm/s TAPSE (M-mode): 2.2 cm LEFT ATRIUM           Index       RIGHT ATRIUM           Index LA diam:      1.90 cm 1.20 cm/m  RA Area:     12.20 cm LA Vol (A2C): 23.4 ml 14.80 ml/m RA Volume:   28.90 ml  18.27 ml/m LA Vol (A4C): 28.5 ml 18.02 ml/m  AORTIC VALVE AV Area (Vmax):    1.72 cm AV Area (Vmean):   1.75 cm AV Area (VTI):     2.06 cm AV Vmax:           170.00 cm/s AV Vmean:          105.000 cm/s AV VTI:            0.286 m AV Peak Grad:      11.6 mmHg AV Mean Grad:      5.0 mmHg LVOT Vmax:         115.00 cm/s LVOT Vmean:         72.300 cm/s LVOT VTI:          0.231 m LVOT/AV VTI ratio: 0.81  AORTA Ao Root diam: 2.80 cm Ao Asc diam:  2.80 cm MITRAL VALVE MV Area (PHT): 3.77 cm    SHUNTS MV Peak grad:  4.0 mmHg    Systemic VTI:  0.23 m MV Mean grad:  2.0 mmHg    Systemic Diam: 1.80 cm MV Vmax:       1.00 m/s MV Vmean:      65.0 cm/s MV Decel Time: 201 msec MV E velocity: 75.90 cm/s MV A velocity: 91.20 cm/s MV E/A ratio:  0.83 Rozann Lesches MD Electronically signed by Rozann Lesches MD Signature Date/Time: 12/17/2019/4:01:01 PM    Final     Review of Systems  Musculoskeletal: Positive for arthralgias.  All other systems reviewed and are negative.  Blood pressure (!) 151/85, pulse 85, temperature 98.1 F (36.7 C), temperature source Oral, resp. rate 18, height 5\' 2"  (1.575 m), weight 52.3  kg, SpO2 95 %. Physical Exam  Constitutional: She appears well-developed.  HENT:  Head: Normocephalic.  Eyes: Pupils are equal, round, and reactive to light.  Cardiovascular: Normal rate.  Respiratory: Effort normal.  Musculoskeletal:     Cervical back: Normal range of motion.  Neurological: She is alert.  Skin: Skin is warm.  Psychiatric: She has a normal mood and affect.  Right ankle demonstrates mild medial lateral swelling.  Pedal pulses intact.  Foot is perfused.  She does have a pressure type skin changes on the anterior aspect of the ankle.  This is not in the operative field.  Compartments are soft.  No groin pain with internal or external rotation of the right leg.  No other upper extremity wrist shoulder or elbow complaints bilaterally.  Assessment/Plan: Impression is right ankle trimalleolar ankle fracture with possible syndesmotic instability.  Plan is open reduction internal fixation of the fracture with possible syndesmotic fixation.  Risks and benefits are discussed include not limited to arthritis nonunion malunion infection as well as potential need for hardware removal.  Patient understands risk benefits.  All  questions answered.  Landry Dyke Price [place] 12/18/2019, 7:41 AM

## 2019-12-18 NOTE — Plan of Care (Signed)
  Problem: Education: Goal: Knowledge of General Education information will improve Description Including pain rating scale, medication(s)/side effects and non-pharmacologic comfort measures Outcome: Progressing   

## 2019-12-18 NOTE — Anesthesia Procedure Notes (Signed)
Anesthesia Regional Block: Adductor canal block   Pre-Anesthetic Checklist: ,, timeout performed, Correct Patient, Correct Site, Correct Laterality, Correct Procedure, Correct Position, site marked, Risks and benefits discussed,  Surgical consent,  Pre-op evaluation,  At surgeon's request and post-op pain management  Laterality: Right  Prep: chloraprep       Needles:  Injection technique: Single-shot  Needle Type: Echogenic Needle     Needle Length: 10cm  Needle Gauge: 21     Additional Needles:   Narrative:  Start time: 12/18/2019 7:02 AM End time: 12/18/2019 7:05 AM Injection made incrementally with aspirations every 5 mL.  Performed by: Personally  Anesthesiologist: Audry Pili, MD  Additional Notes: No pain on injection. No increased resistance to injection. Injection made in 5cc increments. Good needle visualization. Patient tolerated the procedure well.

## 2019-12-18 NOTE — Progress Notes (Signed)
Arrived 3e after giving report and not having transfer order. Informed upon arrival to 3e, pt now has transfer orders and 3e room being cleaned. Arrangements made with bed control/margot and 6N to transfer to 6n1. Pt made aware of situation/ no reaction.

## 2019-12-18 NOTE — Progress Notes (Addendum)
I will cosign the resident's note once it is completed.  Briefly:  The patient is doing well post-op. She and her husband stated that ortho wants to watch her overnight.  Exam: Ext: Right LL cast intact. She is unable to wiggle right toes, but + light sensation. ?? Local anesthetic block. Need to reeval.  A/P:  Trimalleoli fracture (Right), S/P ORIF today. Awaiting post-op PT/OT eval.  Home as soon as PT and ortho clear her.  AMS: Back at baseline. All lab reviewed and are baseline. Stable for home d/c pending ortho's clearance.  Acute respiratory failure: Resolved. Treating for CAP with oral Zithromax day #2. Complete a 5-day course.

## 2019-12-18 NOTE — Anesthesia Procedure Notes (Signed)
Anesthesia Regional Block: Popliteal block   Pre-Anesthetic Checklist: ,, timeout performed, Correct Patient, Correct Site, Correct Laterality, Correct Procedure, Correct Position, site marked, Risks and benefits discussed,  Surgical consent,  Pre-op evaluation,  At surgeon's request and post-op pain management  Laterality: Right  Prep: chloraprep       Needles:  Injection technique: Single-shot  Needle Type: Echogenic Needle     Needle Length: 10cm  Needle Gauge: 21     Additional Needles:   Narrative:  Start time: 12/18/2019 7:06 AM End time: 12/18/2019 7:10 AM Injection made incrementally with aspirations every 5 mL.  Performed by: Personally  Anesthesiologist: Audry Pili, MD  Additional Notes: No pain on injection. No increased resistance to injection. Injection made in 5cc increments. Good needle visualization. Patient tolerated the procedure well.

## 2019-12-18 NOTE — Anesthesia Procedure Notes (Signed)
Procedure Name: Intubation Date/Time: 12/18/2019 7:47 AM Performed by: Neldon Newport, CRNA Pre-anesthesia Checklist: Timeout performed, Patient being monitored, Suction available, Emergency Drugs available and Patient identified Patient Re-evaluated:Patient Re-evaluated prior to induction Oxygen Delivery Method: Circle system utilized Preoxygenation: Pre-oxygenation with 100% oxygen Induction Type: IV induction Ventilation: Mask ventilation without difficulty Laryngoscope Size: Mac and 3 Grade View: Grade I Tube type: Oral Tube size: 7.0 mm Number of attempts: 1 Placement Confirmation: ETT inserted through vocal cords under direct vision,  positive ETCO2 and breath sounds checked- equal and bilateral Secured at: 20 cm Tube secured with: Tape Dental Injury: Teeth and Oropharynx as per pre-operative assessment

## 2019-12-18 NOTE — Progress Notes (Signed)
Husband made aware new room assignment/6n1 by Wendall Papa rn

## 2019-12-19 ENCOUNTER — Encounter: Payer: Self-pay | Admitting: *Deleted

## 2019-12-19 DIAGNOSIS — A419 Sepsis, unspecified organism: Secondary | ICD-10-CM

## 2019-12-19 LAB — BASIC METABOLIC PANEL
Anion gap: 11 (ref 5–15)
BUN: 7 mg/dL (ref 6–20)
CO2: 26 mmol/L (ref 22–32)
Calcium: 8.8 mg/dL — ABNORMAL LOW (ref 8.9–10.3)
Chloride: 103 mmol/L (ref 98–111)
Creatinine, Ser: 0.48 mg/dL (ref 0.44–1.00)
GFR calc Af Amer: >60 mL/min (ref >60)
GFR calc non Af Amer: >60 mL/min (ref >60)
Glucose, Bld: 87 mg/dL (ref 70–99)
Potassium: 3.7 mmol/L (ref 3.5–5.1)
Sodium: 140 mmol/L (ref 135–145)

## 2019-12-19 LAB — CBC
HCT: 30.3 % — ABNORMAL LOW (ref 36.0–46.0)
Hemoglobin: 9.4 g/dL — ABNORMAL LOW (ref 12.0–15.0)
MCH: 25.7 pg — ABNORMAL LOW (ref 26.0–34.0)
MCHC: 31 g/dL (ref 30.0–36.0)
MCV: 82.8 fL (ref 80.0–100.0)
Platelets: 444 10*3/uL — ABNORMAL HIGH (ref 150–400)
RBC: 3.66 MIL/uL — ABNORMAL LOW (ref 3.87–5.11)
RDW: 16.5 % — ABNORMAL HIGH (ref 11.5–15.5)
WBC: 10.1 10*3/uL (ref 4.0–10.5)
nRBC: 0 % (ref 0.0–0.2)

## 2019-12-19 LAB — MAGNESIUM: Magnesium: 1.8 mg/dL (ref 1.7–2.4)

## 2019-12-19 SURGERY — OPEN REDUCTION INTERNAL FIXATION (ORIF) ANKLE FRACTURE
Anesthesia: General | Site: Ankle | Laterality: Right

## 2019-12-19 MED ORDER — FENTANYL 50 MCG/HR TD PT72: 1.0000 | MEDICATED_PATCH | TRANSDERMAL | Status: DC

## 2019-12-19 MED ORDER — ASPIRIN 81 MG PO CHEW: 81.0000 mg | CHEWABLE_TABLET | Freq: Two times a day (BID) | ORAL | 0 refills | Status: DC

## 2019-12-19 MED ORDER — FENTANYL 50 MCG/HR TD PT72
1.0000 | MEDICATED_PATCH | TRANSDERMAL | Status: DC
  Administered 2019-12-19: 1 via TRANSDERMAL
  Filled 2019-12-19: qty 1

## 2019-12-19 MED ORDER — AZITHROMYCIN 250 MG PO TABS: 250.0000 mg | ORAL_TABLET | Freq: Every day | ORAL | 0 refills | Status: AC

## 2019-12-19 NOTE — Discharge Instructions (Addendum)
You were admitted with sepsis criteria and a fractured ankle.  The hospitalization you were treated and your symptoms improved.  You further had a orthopedic surgery procedure to repair your ankle fracture.  We are cutting your fentanyl patches from 123mcg to 82mcg.  When you go to pick up the prescription for the 9mcg we request that you turn in your 100 mcg fentanyl patches to the pharmacy to prevent any errors which could jeopardize her health.  Should you have constipation on the pain medication you can consider MiraLAX.  We also recommend that you follow-up with your primary care doctor in the next 1 week.  Continue azithromycin for 2 more days, ending 12/21/2019.  Call Dr. Marlou Sa for ortho follow up appointment 7-10 days after procedure (June 7th-10th). [address], [place] [address]. [phone]

## 2019-12-19 NOTE — Progress Notes (Signed)
  Speech Language Pathology Treatment: Dysphagia  Patient Details Name: Sheryl Campbell MRN: [id number] DOB: Nov 01, 1960 Today's Date: 12/19/2019 Time: 4315-4008 SLP Time Calculation (min) (ACUTE ONLY): 9 min  Assessment / Plan / Recommendation Clinical Impression  Pt's diet has been upgraded to regular solids, and she denies any difficulties despite not having access to her dentures. She believes her mentation has returned to baseline and masticated regular solids without overt difficulty given extra time. No overt s/s of aspiration noted. SLP to sign off acutely.    HPI HPI: 59 y.o. female presenting with AMS, fever, and shaking. PMH is significant for Bell's palsy, trimalleolar fracture of right ankle, depression, fibromyalgia, osteopenia, sciatica, scoliosis, intervertebral disc disease, arthritis      SLP Plan  All goals met       Recommendations  Diet recommendations: Regular;Thin liquid Liquids provided via: Cup;Straw Medication Administration: Whole meds with liquid Supervision: Patient able to self feed Postural Changes and/or Swallow Maneuvers: Seated upright 90 degrees                Oral Care Recommendations: Oral care BID Follow up Recommendations: None SLP Visit Diagnosis: Dysphagia, unspecified (R13.10) Plan: All goals met       GO                 Osie Bond., M.A. [facility] Pager [phone] Office [phone]  12/19/2019, 2:48 PM

## 2019-12-19 NOTE — Op Note (Signed)
NAME: Sheryl Campbell, Sheryl Campbell MEDICAL RECORD [id number] ACCOUNT [id number] DATE OF BIRTH:May 18, 1961 FACILITY: [facility] LOCATION: [facility] PHYSICIAN:Kenora Spayd Randel Pigg, MD  OPERATIVE REPORT  DATE OF PROCEDURE:  12/18/2019  PREOPERATIVE DIAGNOSIS:  Right trimalleolar ankle fracture.  POSTOPERATIVE DIAGNOSIS:  Right trimalleolar ankle fracture.  PROCEDURE:  Right trimalleolar ankle fracture open reduction internal fixation of medial and lateral malleolus.  SURGEON:  Meredith Pel, MD  ASSISTANT:  Annie Main, PA  INDICATIONS:  [name] is an ambulatory 59 year old patient with right ankle pain following a fall several days ago.  Presents now for operative management after explanation of risks and benefits.  PROCEDURE IN DETAIL:  The patient was brought to the operating room where general anesthetic was induced.  Preoperative antibiotics administered.  Timeout was called.  Right leg prescrubbed with alcohol and Betadine, allowed to air dry.  Prepped with  DuraPrep solution and draped in a sterile manner.  Ioban used to cover the operative field.  An ankle Esmarch utilized for approximately 50 minutes.  Lateral approach to the lateral malleolus was made.  Skin and subcutaneous tissue were sharply divided.   Care was taken to avoid injury to superficial peroneal nerve.  Fracture was identified.  Relatively high fracture, oblique.  One attempt was made to place a lag screw, but the bone quality was not sufficient.  [employer] cloverleaf plate was then  applied first distally with 2 nonlocking screws.  Fracture was then reduced and the plate was secured to the fibula using nonlocking screws proximally and more locking screws distally.  Overall alignment, length and rotation was restored.  Stable  construct was made.  This was all done under fluoroscopic guidance.  Attention was directed towards the medial side.  Incision made over the medial malleolus.  Skin and subcutaneous tissue were sharply  divided.  The fracture site was identified.   Periosteum was elevated from around the fracture site.  The fracture was reduced and 2 cannulated 40 mm screws were placed in good position and alignment with good fixation achieved.  The syndesmosis was stressed under fluoroscopic evaluation and found  to be stable.  Ankle Esmarch released.  Thorough irrigation was performed.  Vancomycin powder placed within both incisions.  Incision was then closed using, on the medial side, 3-0 Vicryl to reapproximate the periosteum over the fracture followed by 2-0  Vicryl and 3-0 nylon.  On the lateral side, 2-0 Vicryl and 3-0 nylon was utilized.  Aquacel dressing and posterior splint was applied.  There was a fracture blister in the middle portion of the ankle anteriorly which was not in the operative, and not  near the operative field.  The patient tolerated the procedure well without immediate complications.  Was transferred to the recovery room in stable condition.  Luke's assistance was required at all times for opening and closing, tissue retraction and  mobilization.  His assistance was a medical necessity.  CN/NUANCE  D:12/18/2019 T:12/19/2019 JOB:[id number]

## 2019-12-19 NOTE — Progress Notes (Addendum)
Occupational Therapy Evaluation Patient Details Name: Sheryl Campbell MRN: [id number] DOB: 1961-02-15 Today's Date: 12/19/2019    History of Present Illness 59 y.o. female presenting with AMS, fever, and shaking. PMH is significant for Bell's palsy, trimalleolar fracture of right ankle, depression, fibromyalgia, osteopenia, sciatica, scoliosis, intervertebral disc disease, arthritis, and s/p R ankle ORIF on 05/31.   Clinical Impression   Prior to hospitalization, pt was independent with all ADLs/IADLs, including driving, laundry, and cleaning. Pt admitted for above and limited by decreased activity tolerance/balance and increased pain in R ankle. Today, pt received sitting upright in bed, father present at bedside. Assessed bed mobility, functional transfers, ambulation to/from bathroom, and BADL function. Monitored BP in sitting before/after functional activity- before: 153/85 and after: 158/69. No dizziness or light-headedness noted by pt. Pt transitioned to EOB and back with Mod I using bed railing and extended time to support RLE independently. Pt requires min guard for functional transfers and v/c's for safe RW management/use of grab bar. Pt donned L sock in sitting without difficulty and washed face with setup. Educated pt on energy conservation strategies and deep breathing. Instructed pt/father to ask physician about: NWB status, shower clearance, and driving clearance. Pt c/o 8/10 pain in R ankle, requested pain meds, nurse present to administer. Pt/father confirmed having all the necessary AE/DME for home and 24/7 supervision/assist from family members. Recommend HHOT to eval home environment for ADL routine success. Will continue to follow pt acutely as able.     Follow Up Recommendations  Home health OT;Supervision/Assistance - 24 hour    Equipment Recommendations  Other (comment)(grab bars near toilet and in walk-in shower)    Recommendations for Other Services       Precautions /  Restrictions Precautions Precautions: Fall;Other (comment) Precaution Comments: RLE NWB Required Braces or Orthoses: Other Brace(RLE splint s/p right ankle ORIF) Other Brace: RLE splint s/p R ankle ORIF Restrictions Weight Bearing Restrictions: Yes RLE Weight Bearing: Non weight bearing      Mobility Bed Mobility Overal bed mobility: Modified Independent Bed Mobility: Supine to Sit;Sit to Supine     Supine to sit: Modified independent (Device/Increase time) Sit to supine: Modified independent (Device/Increase time)   General bed mobility comments: pt transitioned from sitting upright in bed to sitting EOB with Mod I using bed railing and extended time  Transfers Overall transfer level: Needs assistance Equipment used: Rolling walker (2 wheeled) Transfers: Sit to/from Stand Sit to Stand: Min guard;From elevated surface         General transfer comment: Pt required min guard for sit>stand off bed using RW for support; pt able to maintain NWB precautions     Balance Overall balance assessment: Needs assistance Sitting-balance support: No upper extremity supported;Feet supported Sitting balance-Leahy Scale: Good     Standing balance support: Bilateral upper extremity supported;During functional activity Standing balance-Leahy Scale: Fair Standing balance comment: relies on UE supporrt for balance                           ADL either performed or assessed with clinical judgement   ADL Overall ADL's : Needs assistance/impaired Eating/Feeding: Independent;Sitting   Grooming: Wash/dry hands;Wash/dry face;Set up;Sitting   Upper Body Bathing: Supervision/ safety;Sitting   Lower Body Bathing: Min guard;Sitting/lateral leans;Sit to/from stand   Upper Body Dressing : Independent;Sitting   Lower Body Dressing: Min guard;Sitting/lateral leans;Sit to/from stand   Toilet Transfer: Min guard;Ambulation;Regular Toilet;Grab bars;RW   Toileting- Clothing Manipulation  and  Hygiene: Supervision/safety;Sitting/lateral lean;Sit to/from stand   Tub/ Shower Transfer: Min guard;Cueing for safety;Ambulation;Shower seat;Grab bars;Rolling walker   Functional mobility during ADLs: Min guard;Rolling walker General ADL Comments: Pt able to perform BADLs with min guard-supervision for safety; pt transferred on/off standard toilet with min guard, washed face with setup, donned L sock independently in sitting.     Vision Baseline Vision/History: Wears glasses Wears Glasses: Distance only Patient Visual Report: No change from baseline Vision Assessment?: No apparent visual deficits     Perception     Praxis      Pertinent Vitals/Pain Pain Assessment: 0-10 Pain Score: 8  Faces Pain Scale: Hurts even more Pain Location: right ankle Pain Descriptors / Indicators: Aching;Grimacing;Guarding Pain Intervention(s): Monitored during session;Repositioned;Patient requesting pain meds-RN notified;RN gave pain meds during session;Other (comment)(deep breathing )     Hand Dominance Right   Extremity/Trunk Assessment Upper Extremity Assessment Upper Extremity Assessment: Overall WFL for tasks assessed   Lower Extremity Assessment Lower Extremity Assessment: Defer to PT evaluation   Cervical / Trunk Assessment Cervical / Trunk Assessment: Normal   Communication Communication Communication: No difficulties   Cognition Arousal/Alertness: Awake/alert Behavior During Therapy: WFL for tasks assessed/performed Overall Cognitive Status: Within Functional Limits for tasks assessed                                     General Comments  pt very motivated to return home; understands importance of adhering to NWB precautions; open to suggestions    Exercises     Shoulder Instructions      Home Living Family/patient expects to be discharged to:: Private residence Living Arrangements: Spouse/significant other Available Help at Discharge: Family;Available  24 hours/day Type of Home: House Home Access: Stairs to enter [employer] of Steps: 3 Entrance Stairs-Rails: Can reach both Home Layout: One level     Bathroom Shower/Tub: Occupational psychologist: Standard Bathroom Accessibility: Yes How Accessible: Accessible via walker Home Equipment: [place] - 4 wheels;Other (comment);Shower seat - built in;Bedside commode;Crutches(knee scooter)          Prior Functioning/Environment Level of Independence: Independent    ADL's / Homemaking Assistance Needed: Independent with ADLs including BADLs, driving, cleaning, and cooking            OT Problem List: Decreased activity tolerance;Impaired balance (sitting and/or standing);Pain      OT Treatment/Interventions: Self-care/ADL training;Therapeutic exercise;Neuromuscular education;Energy conservation;DME and/or AE instruction;Therapeutic activities    OT Goals(Current goals can be found in the care plan section) Acute Rehab OT Goals Patient Stated Goal: to get home  OT Frequency: Min 2X/week   Barriers to D/C:            Co-evaluation              AM-PAC OT "6 Clicks" Daily Activity     Outcome Measure Help from another person eating meals?: None Help from another person taking care of personal grooming?: A Little Help from another person toileting, which includes using toliet, bedpan, or urinal?: A Little Help from another person bathing (including washing, rinsing, drying)?: A Little Help from another person to put on and taking off regular upper body clothing?: None Help from another person to put on and taking off regular lower body clothing?: A Little 6 Click Score: 20   End of Session Equipment Utilized During Treatment: Gait belt;Rolling walker Nurse Communication: Mobility status  Activity Tolerance: Patient tolerated  treatment well;Patient limited by pain Patient left: in bed;with call bell/phone within reach;with nursing/sitter in room;with  family/visitor present  OT Visit Diagnosis: Unsteadiness on feet (R26.81);Muscle weakness (generalized) (M62.81);Pain Pain - Right/Left: Right Pain - part of body: Ankle and joints of foot                Time: [id number] OT Time Calculation (min): 35 min Charges:  OT General Charges $OT Visit: 1 Visit OT Evaluation $OT Eval Moderate Complexity: 1 Mod OT Treatments $Self Care/Home Management : 8-22 mins  Michel Bickers, OTR/L [facility] [phone]  Francesca Jewett 12/19/2019, 10:20 AM

## 2019-12-19 NOTE — Progress Notes (Signed)
Patient discharged to home with instructions. 

## 2019-12-19 NOTE — Progress Notes (Signed)
Physical Therapy Treatment Patient Details Name: Sheryl Campbell MRN: [id number] DOB: Nov 26, 1960 Today's Date: 12/19/2019    History of Present Illness 59 y.o. female presenting with AMS, fever, and shaking. PMH is significant for Bell's palsy, trimalleolar fracture of right ankle, depression, fibromyalgia, osteopenia, sciatica, scoliosis, intervertebral disc disease, arthritis, and s/p R ankle ORIF on 05/31.    PT Comments    Pt was seen for another visit post op today, with stairs covered due to her geography of home.  Has family visiting who were availiable to see her use crutches, which were re-instructed to pt. She tends to want to place them under axillae and try to stand, but reviewed single hand hold on crutches to get up and to return to sitting.  Pt demonstrates understanding but is likely to need re-instruction.  Will see again tomorrow if pt is not home, to resume gait on RW and crutch training, including stairs if pt wishes.    Follow Up Recommendations  No PT follow up;Follow surgeon's recommendation for DC plan and follow-up therapies     Equipment Recommendations  None recommended by PT    Recommendations for Other Services       Precautions / Restrictions Precautions Precautions: Fall Precaution Comments: NWB on RLE Required Braces or Orthoses: Splint/Cast Splint/Cast: posterior splint on RLE post op Restrictions Weight Bearing Restrictions: Yes RLE Weight Bearing: Non weight bearing    Mobility  Bed Mobility Overal bed mobility: Modified Independent Bed Mobility: Supine to Sit           General bed mobility comments: used bedrail to finish scooting out  Transfers Overall transfer level: Needs assistance Equipment used: Crutches Transfers: Sit to/from [employer] to Stand: Min guard;From elevated surface Stand pivot transfers: Min guard;From elevated surface       General transfer comment: min guard for safety with belt  used  Ambulation/Gait Ambulation/Gait assistance: Counsellor (Feet): 10 Feet Assistive device: Crutches;1 person hand held assist   Gait velocity: controlled   General Gait Details: pt was able to demonstrate use of crutches to PT for plan to try stairs today   Stairs Stairs: Yes Stairs assistance: Min guard;Min assist Stair Management: One rail Right;No rails;Forwards;Step to pattern;With crutches Number of Stairs: 3 General stair comments: used two crutches up and one with rail down to practice both techniques   Wheelchair Mobility    Modified Rankin (Stroke Patients Only)       Balance Overall balance assessment: Needs assistance Sitting-balance support: Single extremity supported Sitting balance-Leahy Scale: Good     Standing balance support: Bilateral upper extremity supported;During functional activity Standing balance-Leahy Scale: Fair Standing balance comment: can use touch on one UE and LLE to control balance                            Cognition Arousal/Alertness: Awake/alert Behavior During Therapy: WFL for tasks assessed/performed Overall Cognitive Status: Within Functional Limits for tasks assessed                                        Exercises      General Comments General comments (skin integrity, edema, etc.): pt is SOB with effort to climb stairs with two rails but with one rail and crutch more controlled fatigue with the effort      Pertinent Vitals/Pain  Pain Assessment: 0-10 Pain Score: 8  Pain Location: right ankle Pain Descriptors / Indicators: Operative site guarding;Guarding;Grimacing Pain Intervention(s): Limited activity within patient's tolerance;Monitored during session;Premedicated before session;Repositioned;Ice applied    Home Living                      Prior Function            PT Goals (current goals can now be found in the care plan section) Acute Rehab PT  Goals Patient Stated Goal: to get home Progress towards PT goals: Progressing toward goals    Frequency    Min 3X/week      PT Plan Current plan remains appropriate    Co-evaluation              AM-PAC PT "6 Clicks" Mobility   Outcome Measure  Help needed turning from your back to your side while in a flat bed without using bedrails?: None Help needed moving from lying on your back to sitting on the side of a flat bed without using bedrails?: None Help needed moving to and from a bed to a chair (including a wheelchair)?: None Help needed standing up from a chair using your arms (e.g., wheelchair or bedside chair)?: A Little Help needed to walk in hospital room?: A Little Help needed climbing 3-5 steps with a railing? : A Little 6 Click Score: 21    End of Session Equipment Utilized During Treatment: Gait belt Activity Tolerance: Patient limited by fatigue;Treatment limited secondary to medical complications (Comment) Patient left: in chair;with call bell/phone within reach;with chair alarm set;with family/visitor present Nurse Communication: Mobility status PT Visit Diagnosis: Unsteadiness on feet (R26.81);Muscle weakness (generalized) (M62.81);Pain Pain - Right/Left: Right Pain - part of body: Ankle and joints of foot     Time: 1051-1130 PT Time Calculation (min) (ACUTE ONLY): 39 min  Charges:  $Gait Training: 23-37 mins $Therapeutic Activity: 8-22 mins                     Ramond Dial 12/19/2019, 1:02 PM  Mee Hives, PT MS Acute Rehab Dept. Number: [place] [facility] [place]

## 2019-12-19 NOTE — Progress Notes (Signed)
  Subjective: Patient is a 59 y.o. female who is POD1 s/p right ankle fracture ORIF.  She is NWB.  She did well with PT earlier today when working on transfers while maintaining NWB status.  She notes moderate pain.  Discussed surgery and recovery with patient.     Objective: Vital signs in last 24 hours: Temp:  [97.7 F (36.5 C)-98.4 F (36.9 C)] 97.7 F (36.5 C) (06/01 0523) Pulse Rate:  [72-88] 74 (06/01 0523) Resp:  [16-18] 18 (06/01 0523) BP: (118-177)/(60-83) 177/83 (06/01 0523) SpO2:  [95 %-100 %] 100 % (06/01 0523)  Intake/Output from previous day: 05/31 0701 - 06/01 0700 In: 1745.8 [P.O.:418; I.V.:1177.8; IV Piggyback:150] Out: 750 [Urine:700; Blood:50] Intake/Output this shift: Total I/O In: 120 [P.O.:120] Out: -   Exam:  Postop splint in place No pain with passive DF/PF of toes Able to actively wiggle toes Toes are warm and perfused   Labs: Recent Labs    12/18/19 0444 12/19/19 0158  HGB 9.5* 9.4*   Recent Labs    12/18/19 0444 12/19/19 0158  WBC 5.6 10.1  RBC 3.71* 3.66*  HCT 30.5* 30.3*  PLT 388 444*   Recent Labs    12/18/19 0444 12/19/19 0158  NA 139 140  K 3.0* 3.7  CL 99 103  CO2 29 26  BUN <5* 7  CREATININE 0.43* 0.48  GLUCOSE 94 87  CALCIUM 8.8* 8.8*   No results for input(s): LABPT, INR in the last 72 hours.  Assessment/Plan: Continue NWB status F/u with Dr. Marlou Sa ~7-10 days postop Okay for discharge from orthopedic standpoint Patient may shower but keep splint dry Caution against driving   [place] 12/19/2019, 11:29 AM

## 2019-12-20 LAB — CULTURE, BLOOD (ROUTINE X 2)
Culture: NO GROWTH
Culture: NO GROWTH
Special Requests: ADEQUATE

## 2019-12-27 ENCOUNTER — Ambulatory Visit (INDEPENDENT_AMBULATORY_CARE_PROVIDER_SITE_OTHER): Payer: PPO

## 2019-12-27 ENCOUNTER — Ambulatory Visit (INDEPENDENT_AMBULATORY_CARE_PROVIDER_SITE_OTHER): Payer: PPO | Admitting: Orthopedic Surgery

## 2019-12-27 ENCOUNTER — Other Ambulatory Visit: Payer: Self-pay

## 2019-12-27 DIAGNOSIS — S82891A Other fracture of right lower leg, initial encounter for closed fracture: Secondary | ICD-10-CM

## 2019-12-28 ENCOUNTER — Telehealth: Payer: Self-pay | Admitting: Orthopedic Surgery

## 2019-12-28 NOTE — Telephone Encounter (Signed)
Advised should be worn when transferring, moving around etc but ok to d/c with sitting. Also reminded her about ROM exercises for ankle her and Dr Marlou Sa discussed yesterday at her appointment.

## 2019-12-28 NOTE — Telephone Encounter (Signed)
Patient called.   Needs to know if she is to be wearing her brace at all times  Call back: [phone]

## 2019-12-30 ENCOUNTER — Encounter: Payer: Self-pay | Admitting: Orthopedic Surgery

## 2019-12-30 NOTE — Progress Notes (Signed)
   Post-Op Visit Note   Patient: Sheryl Campbell           Date of Birth: [birth year]           MRN: [id number] Visit Date: 12/27/2019 PCP: Leonard Downing, MD   Assessment & Plan:  Chief Complaint:  Chief Complaint  Patient presents with  . Right Ankle - Follow-up, Routine Post Op   Visit Diagnoses:  1. Closed fracture of right ankle, initial encounter     Plan: Patient is a 59 year old female presents s/p right ankle ORIF on 12/18/2019.  Patient notes a lot of swelling but overall her pain is well-managed.  She is in pain management.  She sees Dr. Gerald Stabs and is using fentanyl patch for pain relief.  She is compliant with her nonweightbearing status.  She has been taking aspirin once a day.  Incisions are healing well.  No calf tenderness and a negative Homans' sign on exam today.  Plan to continue pain medication by pain management.  Splint removed today and will transition to a walker boot.  Radiographs of the right ankle were taken today and reveal hardware in good position with no change compared with operative radiographs.  No lucency surrounding screws or displacement of the fracture site.  Plan to continue nonweightbearing in fracture boot.  Follow-up on Monday for suture removal.  Patient agreed with plan.  Follow-Up Instructions: No follow-ups on file.   Orders:  Orders Placed This Encounter  Procedures  . XR Ankle Complete Right   No orders of the defined types were placed in this encounter.   Imaging: No results found.  PMFS History: Patient Active Problem List   Diagnosis Date Noted  . Atypical pneumonia   . Closed fracture of right ankle   . Altered mental status 12/15/2019  . Myoclonic jerking    Past Medical History:  Diagnosis Date  . Arthritis   . Bell's palsy   . Cancer ([place])    skin  . Fibromyalgia   . Intervertebral disk disease    "my whole spine"  . Osteopenia   . Sciatica   . Scoliosis     Family History  Problem Relation Age of Onset   . Cancer - Lung Mother   . Diabetes Sister   . Diabetes Brother     Past Surgical History:  Procedure Laterality Date  . BACK SURGERY  05/2004   Harrington rods T3- T11  . BUNIONECTOMY Bilateral 07/2001  . CHOLECYSTECTOMY  02/2000  . NECK SURGERY  10/2002   cervical diskectomy, C5-C7  . NOSE SURGERY  07/1993   deviated septum  . ORIF ANKLE FRACTURE Right 12/18/2019   Procedure: OPEN REDUCTION INTERNAL FIXATION RIGHT ANKLE FRACTURE;  Surgeon: Meredith Pel, MD;  Location: [facility];  Service: Orthopedics;  Laterality: Right;   Social History   Occupational History    Comment: disabled  Tobacco Use  . Smoking status: Current Every Day Smoker    Packs/day: 1.00  . Smokeless tobacco: Never Used  Substance and Sexual Activity  . Alcohol use: No    Comment: rare  . Drug use: No  . Sexual activity: Not on file

## 2020-01-01 ENCOUNTER — Ambulatory Visit (INDEPENDENT_AMBULATORY_CARE_PROVIDER_SITE_OTHER): Payer: PPO | Admitting: Orthopedic Surgery

## 2020-01-01 DIAGNOSIS — S82891A Other fracture of right lower leg, initial encounter for closed fracture: Secondary | ICD-10-CM

## 2020-01-02 DIAGNOSIS — G894 Chronic pain syndrome: Secondary | ICD-10-CM | POA: Diagnosis not present

## 2020-01-02 DIAGNOSIS — M5136 Other intervertebral disc degeneration, lumbar region: Secondary | ICD-10-CM | POA: Diagnosis not present

## 2020-01-02 DIAGNOSIS — M545 Low back pain: Secondary | ICD-10-CM | POA: Diagnosis not present

## 2020-01-02 DIAGNOSIS — M5030 Other cervical disc degeneration, unspecified cervical region: Secondary | ICD-10-CM | POA: Diagnosis not present

## 2020-01-04 ENCOUNTER — Encounter: Payer: Self-pay | Admitting: Orthopedic Surgery

## 2020-01-04 DIAGNOSIS — G894 Chronic pain syndrome: Secondary | ICD-10-CM | POA: Diagnosis not present

## 2020-01-04 NOTE — Progress Notes (Signed)
   Post-Op Visit Note   Patient: Sheryl Campbell           Date of Birth: 1960-09-19           MRN: [id number] Visit Date: 01/01/2020 PCP: Leonard Downing, MD   Assessment & Plan:  Chief Complaint:  Chief Complaint  Patient presents with  . Right Ankle - Routine Post Op   Visit Diagnoses:  1. Closed fracture of right ankle, initial encounter     Plan: Sheryl Campbell is a patient who is now 2 weeks out right ankle open reduction internal fixation.  She has been doing well.  On exam the incision looks intact.  Discontinue the sutures.  2-week return.  Okay to be in the fracture boot with touchdown weightbearing and continue with ankle range of motion exercises.  Syndesmosis does feel stable today.  She will need radiographs on return visit.  Follow-Up Instructions: Return in about 2 weeks (around 01/15/2020).   Orders:  No orders of the defined types were placed in this encounter.  No orders of the defined types were placed in this encounter.   Imaging: No results found.  PMFS History: Patient Active Problem List   Diagnosis Date Noted  . Atypical pneumonia   . Closed fracture of right ankle   . Altered mental status 12/15/2019  . Myoclonic jerking    Past Medical History:  Diagnosis Date  . Arthritis   . Bell's palsy   . Cancer ([place])    skin  . Fibromyalgia   . Intervertebral disk disease    "my whole spine"  . Osteopenia   . Sciatica   . Scoliosis     Family History  Problem Relation Age of Onset  . Cancer - Lung Mother   . Diabetes Sister   . Diabetes Brother     Past Surgical History:  Procedure Laterality Date  . BACK SURGERY  05/2004   Harrington rods T3- T11  . BUNIONECTOMY Bilateral 07/2001  . CHOLECYSTECTOMY  02/2000  . NECK SURGERY  10/2002   cervical diskectomy, C5-C7  . NOSE SURGERY  07/1993   deviated septum  . ORIF ANKLE FRACTURE Right 12/18/2019   Procedure: OPEN REDUCTION INTERNAL FIXATION RIGHT ANKLE FRACTURE;  Surgeon: Meredith Pel, MD;  Location: [facility];  Service: Orthopedics;  Laterality: Right;   Social History   Occupational History    Comment: disabled  Tobacco Use  . Smoking status: Current Every Day Smoker    Packs/day: 1.00  . Smokeless tobacco: Never Used  Substance and Sexual Activity  . Alcohol use: No    Comment: rare  . Drug use: No  . Sexual activity: Not on file

## 2020-01-05 DIAGNOSIS — G8929 Other chronic pain: Secondary | ICD-10-CM | POA: Diagnosis not present

## 2020-01-15 ENCOUNTER — Ambulatory Visit: Payer: PPO | Admitting: Orthopedic Surgery

## 2020-01-16 ENCOUNTER — Ambulatory Visit (INDEPENDENT_AMBULATORY_CARE_PROVIDER_SITE_OTHER): Payer: PPO | Admitting: Orthopedic Surgery

## 2020-01-16 ENCOUNTER — Other Ambulatory Visit: Payer: Self-pay | Admitting: Family Medicine

## 2020-01-16 ENCOUNTER — Other Ambulatory Visit: Payer: Self-pay

## 2020-01-16 ENCOUNTER — Ambulatory Visit: Payer: Self-pay

## 2020-01-16 ENCOUNTER — Ambulatory Visit
Admission: RE | Admit: 2020-01-16 | Discharge: 2020-01-16 | Disposition: A | Discharge status: Home / Self Care | Payer: PPO | Source: Ambulatory Visit | Attending: Family Medicine | Admitting: Family Medicine

## 2020-01-16 DIAGNOSIS — R918 Other nonspecific abnormal finding of lung field: Secondary | ICD-10-CM | POA: Diagnosis not present

## 2020-01-16 DIAGNOSIS — I509 Heart failure, unspecified: Secondary | ICD-10-CM | POA: Diagnosis not present

## 2020-01-16 DIAGNOSIS — S82891A Other fracture of right lower leg, initial encounter for closed fracture: Secondary | ICD-10-CM

## 2020-01-16 DIAGNOSIS — Z9889 Other specified postprocedural states: Secondary | ICD-10-CM | POA: Diagnosis not present

## 2020-01-16 DIAGNOSIS — J189 Pneumonia, unspecified organism: Secondary | ICD-10-CM

## 2020-01-22 ENCOUNTER — Encounter: Payer: Self-pay | Admitting: Orthopedic Surgery

## 2020-01-22 NOTE — Progress Notes (Signed)
   Post-Op Visit Note   Patient: Sheryl Campbell           Date of Birth: 1960-10-26           MRN: [id number] Visit Date: 01/16/2020 PCP: Leonard Downing, MD   Assessment & Plan:  Chief Complaint:  Chief Complaint  Patient presents with  . Right Ankle - Pain   Visit Diagnoses:  1. Closed fracture of right ankle, initial encounter     Plan: Patient is a 59 year old female presents s/p right ankle ORIF on 12/18/2019.  She is ambulating full weightbearing in fracture boot.  She is not really using her crutches.  She is 4 weeks out from procedure.  She has been taking Tylenol and ibuprofen for pain relief.  Not taking any narcotic pain medication.  She is compliant with taking aspirin.  She denies any fevers, chills, drainage from incision.  On exam she has no calf tenderness and a negative Homans' sign.  She dorsiflexes to about 10 degrees past neutral.  Incisions are healing well without any evidence of infection or dehiscence.  Syndesmosis is stable.  Fracture blister is healing without any drainage.  She has no significant tenderness to palpation over the medial or lateral malleoli.  Plan to continue full weightbearing in the boot over the next 2 weeks and then transition to a regular shoe.  Follow-up in 4 weeks with x-rays for final check.  Follow-Up Instructions: No follow-ups on file.   Orders:  Orders Placed This Encounter  Procedures  . XR Ankle Complete Right   No orders of the defined types were placed in this encounter.   Imaging: No results found.  PMFS History: Patient Active Problem List   Diagnosis Date Noted  . Atypical pneumonia   . Closed fracture of right ankle   . Altered mental status 12/15/2019  . Myoclonic jerking    Past Medical History:  Diagnosis Date  . Arthritis   . Bell's palsy   . Cancer ([place])    skin  . Fibromyalgia   . Intervertebral disk disease    "my whole spine"  . Osteopenia   . Sciatica   . Scoliosis     Family History    Problem Relation Age of Onset  . Cancer - Lung Mother   . Diabetes Sister   . Diabetes Brother     Past Surgical History:  Procedure Laterality Date  . BACK SURGERY  05/2004   Harrington rods T3- T11  . BUNIONECTOMY Bilateral 07/2001  . CHOLECYSTECTOMY  02/2000  . NECK SURGERY  10/2002   cervical diskectomy, C5-C7  . NOSE SURGERY  07/1993   deviated septum  . ORIF ANKLE FRACTURE Right 12/18/2019   Procedure: OPEN REDUCTION INTERNAL FIXATION RIGHT ANKLE FRACTURE;  Surgeon: Meredith Pel, MD;  Location: [facility];  Service: Orthopedics;  Laterality: Right;   Social History   Occupational History    Comment: disabled  Tobacco Use  . Smoking status: Current Every Day Smoker    Packs/day: 1.00  . Smokeless tobacco: Never Used  Substance and Sexual Activity  . Alcohol use: No    Comment: rare  . Drug use: No  . Sexual activity: Not on file

## 2020-01-30 DIAGNOSIS — M48062 Spinal stenosis, lumbar region with neurogenic claudication: Secondary | ICD-10-CM | POA: Diagnosis not present

## 2020-01-30 DIAGNOSIS — M792 Neuralgia and neuritis, unspecified: Secondary | ICD-10-CM | POA: Diagnosis not present

## 2020-01-30 DIAGNOSIS — M25559 Pain in unspecified hip: Secondary | ICD-10-CM | POA: Diagnosis not present

## 2020-01-30 DIAGNOSIS — G4700 Insomnia, unspecified: Secondary | ICD-10-CM | POA: Diagnosis not present

## 2020-01-30 DIAGNOSIS — M47897 Other spondylosis, lumbosacral region: Secondary | ICD-10-CM | POA: Diagnosis not present

## 2020-01-30 DIAGNOSIS — M5030 Other cervical disc degeneration, unspecified cervical region: Secondary | ICD-10-CM | POA: Diagnosis not present

## 2020-01-30 DIAGNOSIS — M5136 Other intervertebral disc degeneration, lumbar region: Secondary | ICD-10-CM | POA: Diagnosis not present

## 2020-01-30 DIAGNOSIS — G8929 Other chronic pain: Secondary | ICD-10-CM | POA: Diagnosis not present

## 2020-01-30 DIAGNOSIS — M5134 Other intervertebral disc degeneration, thoracic region: Secondary | ICD-10-CM | POA: Diagnosis not present

## 2020-01-30 DIAGNOSIS — M79609 Pain in unspecified limb: Secondary | ICD-10-CM | POA: Diagnosis not present

## 2020-01-30 DIAGNOSIS — M542 Cervicalgia: Secondary | ICD-10-CM | POA: Diagnosis not present

## 2020-01-30 DIAGNOSIS — G894 Chronic pain syndrome: Secondary | ICD-10-CM | POA: Diagnosis not present

## 2020-01-30 DIAGNOSIS — R519 Headache, unspecified: Secondary | ICD-10-CM | POA: Diagnosis not present

## 2020-01-30 DIAGNOSIS — M9951 Intervertebral disc stenosis of neural canal of cervical region: Secondary | ICD-10-CM | POA: Diagnosis not present

## 2020-01-30 DIAGNOSIS — M25519 Pain in unspecified shoulder: Secondary | ICD-10-CM | POA: Diagnosis not present

## 2020-02-14 ENCOUNTER — Ambulatory Visit (INDEPENDENT_AMBULATORY_CARE_PROVIDER_SITE_OTHER): Payer: PPO | Admitting: Orthopedic Surgery

## 2020-02-14 ENCOUNTER — Ambulatory Visit: Payer: Self-pay

## 2020-02-14 DIAGNOSIS — S82891A Other fracture of right lower leg, initial encounter for closed fracture: Secondary | ICD-10-CM

## 2020-02-18 ENCOUNTER — Encounter: Payer: Self-pay | Admitting: Orthopedic Surgery

## 2020-02-18 NOTE — Progress Notes (Signed)
   Post-Op Visit Note   Patient: Sheryl Campbell           Date of Birth: 01/14/1961           MRN: [id number] Visit Date: 02/14/2020 PCP: Leonard Downing, MD   Assessment & Plan:  Chief Complaint:  Chief Complaint  Patient presents with  . Right Ankle - Routine Post Op   Visit Diagnoses:  1. Closed fracture of right ankle, initial encounter     Plan: Patient is a 59 year old female presents s/p right ankle ORIF on 12/18/2019. She is ambulating full weightbearing with a ankle brace. She notes a little bit of increased pain since discontinuing the fracture boot. She localizes the majority of her pain to the medial aspect of the right ankle. She has a little bit of tenderness over the medial malleolus but overall she walks without a limp. She is wearing flip-flops today. Recommend she wear shoe wear that is more supportive of her ankle. She understands. Incisions are healing well. She dorsiflexes to 5 degrees past neutral. Recommend she continue working on ankle pumps to work on dorsiflexion range of motion. She does have continued swelling of the right ankle, particularly laterally. This should resolve with time out from surgical procedure. She no longer wakes with pain. She takes Tylenol and ibuprofen on occasion for pain control. Radiographs of the right ankle reveal right ankle fracture with interval healing since last office visit. Plan to continue full weightbearing with ankle brace. Follow-up in 6 weeks for final check.  Follow-Up Instructions: No follow-ups on file.   Orders:  Orders Placed This Encounter  Procedures  . XR Ankle Complete Right   No orders of the defined types were placed in this encounter.   Imaging: No results found.  PMFS History: Patient Active Problem List   Diagnosis Date Noted  . Atypical pneumonia   . Closed fracture of right ankle   . Altered mental status 12/15/2019  . Myoclonic jerking    Past Medical History:  Diagnosis Date  .  Arthritis   . Bell's palsy   . Cancer ([place])    skin  . Fibromyalgia   . Intervertebral disk disease    "my whole spine"  . Osteopenia   . Sciatica   . Scoliosis     Family History  Problem Relation Age of Onset  . Cancer - Lung Mother   . Diabetes Sister   . Diabetes Brother     Past Surgical History:  Procedure Laterality Date  . BACK SURGERY  05/2004   Harrington rods T3- T11  . BUNIONECTOMY Bilateral 07/2001  . CHOLECYSTECTOMY  02/2000  . NECK SURGERY  10/2002   cervical diskectomy, C5-C7  . NOSE SURGERY  07/1993   deviated septum  . ORIF ANKLE FRACTURE Right 12/18/2019   Procedure: OPEN REDUCTION INTERNAL FIXATION RIGHT ANKLE FRACTURE;  Surgeon: Meredith Pel, MD;  Location: [facility];  Service: Orthopedics;  Laterality: Right;   Social History   Occupational History    Comment: disabled  Tobacco Use  . Smoking status: Current Every Day Smoker    Packs/day: 1.00  . Smokeless tobacco: Never Used  Substance and Sexual Activity  . Alcohol use: No    Comment: rare  . Drug use: No  . Sexual activity: Not on file

## 2020-02-20 ENCOUNTER — Other Ambulatory Visit: Payer: PPO

## 2020-02-23 ENCOUNTER — Ambulatory Visit
Admission: RE | Admit: 2020-02-23 | Discharge: 2020-02-23 | Disposition: A | Discharge status: Home / Self Care | Payer: PPO | Source: Ambulatory Visit | Attending: Family Medicine | Admitting: Family Medicine

## 2020-02-23 ENCOUNTER — Other Ambulatory Visit: Payer: Self-pay

## 2020-02-23 DIAGNOSIS — E2839 Other primary ovarian failure: Secondary | ICD-10-CM

## 2020-02-23 DIAGNOSIS — M810 Age-related osteoporosis without current pathological fracture: Secondary | ICD-10-CM | POA: Diagnosis not present

## 2020-02-23 DIAGNOSIS — Z780 Asymptomatic menopausal state: Secondary | ICD-10-CM | POA: Diagnosis not present

## 2020-02-23 DIAGNOSIS — M8588 Other specified disorders of bone density and structure, other site: Secondary | ICD-10-CM | POA: Diagnosis not present

## 2020-02-27 DIAGNOSIS — G894 Chronic pain syndrome: Secondary | ICD-10-CM | POA: Diagnosis not present

## 2020-03-01 DIAGNOSIS — R509 Fever, unspecified: Secondary | ICD-10-CM | POA: Diagnosis not present

## 2020-03-01 DIAGNOSIS — R05 Cough: Secondary | ICD-10-CM | POA: Diagnosis not present

## 2020-03-05 DIAGNOSIS — G894 Chronic pain syndrome: Secondary | ICD-10-CM | POA: Diagnosis not present

## 2020-03-26 DIAGNOSIS — M5136 Other intervertebral disc degeneration, lumbar region: Secondary | ICD-10-CM | POA: Diagnosis not present

## 2020-03-26 DIAGNOSIS — M5134 Other intervertebral disc degeneration, thoracic region: Secondary | ICD-10-CM | POA: Diagnosis not present

## 2020-03-26 DIAGNOSIS — G894 Chronic pain syndrome: Secondary | ICD-10-CM | POA: Diagnosis not present

## 2020-03-26 DIAGNOSIS — M5030 Other cervical disc degeneration, unspecified cervical region: Secondary | ICD-10-CM | POA: Diagnosis not present

## 2020-03-26 DIAGNOSIS — M542 Cervicalgia: Secondary | ICD-10-CM | POA: Diagnosis not present

## 2020-03-27 ENCOUNTER — Ambulatory Visit: Payer: PPO | Admitting: Orthopedic Surgery

## 2020-04-08 ENCOUNTER — Ambulatory Visit: Payer: Self-pay

## 2020-04-08 ENCOUNTER — Ambulatory Visit (INDEPENDENT_AMBULATORY_CARE_PROVIDER_SITE_OTHER): Payer: PPO | Admitting: Orthopedic Surgery

## 2020-04-08 DIAGNOSIS — S82891A Other fracture of right lower leg, initial encounter for closed fracture: Secondary | ICD-10-CM

## 2020-04-11 ENCOUNTER — Encounter: Payer: Self-pay | Admitting: Orthopedic Surgery

## 2020-04-11 NOTE — Progress Notes (Signed)
   Post-Op Visit Note   Patient: Sheryl Campbell           Date of Birth: [birth year]           MRN: [id number] Visit Date: 04/08/2020 PCP: Leonard Downing, MD   Assessment & Plan:  Chief Complaint:  Chief Complaint  Patient presents with  . Post-op Follow-up   Visit Diagnoses:  1. Closed fracture of right ankle, initial encounter     Plan: [place] is a patient who underwent right ankle fracture fixation 11/20/2019.  She has been doing well.  Here today for final check.  Does have some swelling.  Is able to ambulate.  Incision is intact with mild swelling but ankle range of motion is good.  Plan at this time is to release her to activity as tolerated.  Follow-up as needed could consider compression stockings for swelling if needed.  No calf tenderness negative Homans today.  Follow-Up Instructions: Return if symptoms worsen or fail to improve.   Orders:  No orders of the defined types were placed in this encounter.  No orders of the defined types were placed in this encounter.   Imaging: No results found.  PMFS History: Patient Active Problem List   Diagnosis Date Noted  . Atypical pneumonia   . Closed fracture of right ankle   . Altered mental status 12/15/2019  . Myoclonic jerking    Past Medical History:  Diagnosis Date  . Arthritis   . Bell's palsy   . Cancer ([place])    skin  . Fibromyalgia   . Intervertebral disk disease    "my whole spine"  . Osteopenia   . Sciatica   . Scoliosis     Family History  Problem Relation Age of Onset  . Cancer - Lung Mother   . Diabetes Sister   . Diabetes Brother     Past Surgical History:  Procedure Laterality Date  . BACK SURGERY  05/2004   Harrington rods T3- T11  . BUNIONECTOMY Bilateral 07/2001  . CHOLECYSTECTOMY  02/2000  . NECK SURGERY  10/2002   cervical diskectomy, C5-C7  . NOSE SURGERY  07/1993   deviated septum  . ORIF ANKLE FRACTURE Right 12/18/2019   Procedure: OPEN REDUCTION INTERNAL FIXATION RIGHT  ANKLE FRACTURE;  Surgeon: Meredith Pel, MD;  Location: [facility];  Service: Orthopedics;  Laterality: Right;   Social History   Occupational History    Comment: disabled  Tobacco Use  . Smoking status: Current Every Day Smoker    Packs/day: 1.00  . Smokeless tobacco: Never Used  Substance and Sexual Activity  . Alcohol use: No    Comment: rare  . Drug use: No  . Sexual activity: Not on file

## 2020-04-23 DIAGNOSIS — G894 Chronic pain syndrome: Secondary | ICD-10-CM | POA: Diagnosis not present

## 2020-04-23 DIAGNOSIS — M5030 Other cervical disc degeneration, unspecified cervical region: Secondary | ICD-10-CM | POA: Diagnosis not present

## 2020-04-23 DIAGNOSIS — M5134 Other intervertebral disc degeneration, thoracic region: Secondary | ICD-10-CM | POA: Diagnosis not present

## 2020-05-04 DIAGNOSIS — G894 Chronic pain syndrome: Secondary | ICD-10-CM | POA: Diagnosis not present

## 2020-05-21 DIAGNOSIS — M47897 Other spondylosis, lumbosacral region: Secondary | ICD-10-CM | POA: Diagnosis not present

## 2020-05-21 DIAGNOSIS — M48062 Spinal stenosis, lumbar region with neurogenic claudication: Secondary | ICD-10-CM | POA: Diagnosis not present

## 2020-05-21 DIAGNOSIS — M792 Neuralgia and neuritis, unspecified: Secondary | ICD-10-CM | POA: Diagnosis not present

## 2020-05-21 DIAGNOSIS — M5136 Other intervertebral disc degeneration, lumbar region: Secondary | ICD-10-CM | POA: Diagnosis not present

## 2020-05-21 DIAGNOSIS — M79669 Pain in unspecified lower leg: Secondary | ICD-10-CM | POA: Diagnosis not present

## 2020-05-21 DIAGNOSIS — M5134 Other intervertebral disc degeneration, thoracic region: Secondary | ICD-10-CM | POA: Diagnosis not present

## 2020-05-21 DIAGNOSIS — G4700 Insomnia, unspecified: Secondary | ICD-10-CM | POA: Diagnosis not present

## 2020-05-21 DIAGNOSIS — G8929 Other chronic pain: Secondary | ICD-10-CM | POA: Diagnosis not present

## 2020-05-21 DIAGNOSIS — M4726 Other spondylosis with radiculopathy, lumbar region: Secondary | ICD-10-CM | POA: Diagnosis not present

## 2020-05-21 DIAGNOSIS — M4807 Spinal stenosis, lumbosacral region: Secondary | ICD-10-CM | POA: Diagnosis not present

## 2020-05-21 DIAGNOSIS — M25559 Pain in unspecified hip: Secondary | ICD-10-CM | POA: Diagnosis not present

## 2020-05-21 DIAGNOSIS — M25519 Pain in unspecified shoulder: Secondary | ICD-10-CM | POA: Diagnosis not present

## 2020-05-21 DIAGNOSIS — M179 Osteoarthritis of knee, unspecified: Secondary | ICD-10-CM | POA: Diagnosis not present

## 2020-05-21 DIAGNOSIS — M5030 Other cervical disc degeneration, unspecified cervical region: Secondary | ICD-10-CM | POA: Diagnosis not present

## 2020-06-03 DIAGNOSIS — G894 Chronic pain syndrome: Secondary | ICD-10-CM | POA: Diagnosis not present

## 2020-06-17 DIAGNOSIS — M5030 Other cervical disc degeneration, unspecified cervical region: Secondary | ICD-10-CM | POA: Diagnosis not present

## 2020-06-17 DIAGNOSIS — M5134 Other intervertebral disc degeneration, thoracic region: Secondary | ICD-10-CM | POA: Diagnosis not present

## 2020-06-17 DIAGNOSIS — M5136 Other intervertebral disc degeneration, lumbar region: Secondary | ICD-10-CM | POA: Diagnosis not present

## 2020-06-17 DIAGNOSIS — M542 Cervicalgia: Secondary | ICD-10-CM | POA: Diagnosis not present

## 2020-06-26 DIAGNOSIS — L659 Nonscarring hair loss, unspecified: Secondary | ICD-10-CM | POA: Diagnosis not present

## 2020-06-26 DIAGNOSIS — Z23 Encounter for immunization: Secondary | ICD-10-CM | POA: Diagnosis not present

## 2020-06-26 DIAGNOSIS — R5383 Other fatigue: Secondary | ICD-10-CM | POA: Diagnosis not present

## 2020-06-26 DIAGNOSIS — L639 Alopecia areata, unspecified: Secondary | ICD-10-CM | POA: Diagnosis not present

## 2020-06-26 DIAGNOSIS — D649 Anemia, unspecified: Secondary | ICD-10-CM | POA: Diagnosis not present

## 2020-06-28 DIAGNOSIS — Z9889 Other specified postprocedural states: Secondary | ICD-10-CM | POA: Diagnosis not present

## 2020-06-28 DIAGNOSIS — Z981 Arthrodesis status: Secondary | ICD-10-CM | POA: Diagnosis not present

## 2020-06-28 DIAGNOSIS — M47816 Spondylosis without myelopathy or radiculopathy, lumbar region: Secondary | ICD-10-CM | POA: Diagnosis not present

## 2020-07-03 DIAGNOSIS — G894 Chronic pain syndrome: Secondary | ICD-10-CM | POA: Diagnosis not present

## 2020-07-17 DIAGNOSIS — M5136 Other intervertebral disc degeneration, lumbar region: Secondary | ICD-10-CM | POA: Diagnosis not present

## 2020-07-17 DIAGNOSIS — M5134 Other intervertebral disc degeneration, thoracic region: Secondary | ICD-10-CM | POA: Diagnosis not present

## 2020-07-17 DIAGNOSIS — M5030 Other cervical disc degeneration, unspecified cervical region: Secondary | ICD-10-CM | POA: Diagnosis not present

## 2020-07-17 DIAGNOSIS — M542 Cervicalgia: Secondary | ICD-10-CM | POA: Diagnosis not present

## 2020-08-02 DIAGNOSIS — G894 Chronic pain syndrome: Secondary | ICD-10-CM | POA: Diagnosis not present

## 2020-08-12 DIAGNOSIS — M542 Cervicalgia: Secondary | ICD-10-CM | POA: Diagnosis not present

## 2020-08-12 DIAGNOSIS — M5136 Other intervertebral disc degeneration, lumbar region: Secondary | ICD-10-CM | POA: Diagnosis not present

## 2020-08-12 DIAGNOSIS — G894 Chronic pain syndrome: Secondary | ICD-10-CM | POA: Diagnosis not present

## 2020-08-12 DIAGNOSIS — M5134 Other intervertebral disc degeneration, thoracic region: Secondary | ICD-10-CM | POA: Diagnosis not present

## 2020-08-12 DIAGNOSIS — M5030 Other cervical disc degeneration, unspecified cervical region: Secondary | ICD-10-CM | POA: Diagnosis not present

## 2020-09-01 DIAGNOSIS — G894 Chronic pain syndrome: Secondary | ICD-10-CM | POA: Diagnosis not present

## 2020-09-09 DIAGNOSIS — M47897 Other spondylosis, lumbosacral region: Secondary | ICD-10-CM | POA: Diagnosis not present

## 2020-09-09 DIAGNOSIS — M48062 Spinal stenosis, lumbar region with neurogenic claudication: Secondary | ICD-10-CM | POA: Diagnosis not present

## 2020-09-09 DIAGNOSIS — M792 Neuralgia and neuritis, unspecified: Secondary | ICD-10-CM | POA: Diagnosis not present

## 2020-09-09 DIAGNOSIS — M25559 Pain in unspecified hip: Secondary | ICD-10-CM | POA: Diagnosis not present

## 2020-09-09 DIAGNOSIS — G894 Chronic pain syndrome: Secondary | ICD-10-CM | POA: Diagnosis not present

## 2020-09-09 DIAGNOSIS — M9971 Connective tissue and disc stenosis of intervertebral foramina of cervical region: Secondary | ICD-10-CM | POA: Diagnosis not present

## 2020-09-09 DIAGNOSIS — R519 Headache, unspecified: Secondary | ICD-10-CM | POA: Diagnosis not present

## 2020-09-09 DIAGNOSIS — M79609 Pain in unspecified limb: Secondary | ICD-10-CM | POA: Diagnosis not present

## 2020-09-09 DIAGNOSIS — M9941 Connective tissue stenosis of neural canal of cervical region: Secondary | ICD-10-CM | POA: Diagnosis not present

## 2020-09-09 DIAGNOSIS — M9951 Intervertebral disc stenosis of neural canal of cervical region: Secondary | ICD-10-CM | POA: Diagnosis not present

## 2020-09-09 DIAGNOSIS — M9931 Osseous stenosis of neural canal of cervical region: Secondary | ICD-10-CM | POA: Diagnosis not present

## 2020-09-09 DIAGNOSIS — G4700 Insomnia, unspecified: Secondary | ICD-10-CM | POA: Diagnosis not present

## 2020-09-09 DIAGNOSIS — G8929 Other chronic pain: Secondary | ICD-10-CM | POA: Diagnosis not present

## 2020-09-13 DIAGNOSIS — Z9889 Other specified postprocedural states: Secondary | ICD-10-CM | POA: Diagnosis not present

## 2020-09-13 DIAGNOSIS — Z981 Arthrodesis status: Secondary | ICD-10-CM | POA: Diagnosis not present

## 2020-09-13 DIAGNOSIS — M47816 Spondylosis without myelopathy or radiculopathy, lumbar region: Secondary | ICD-10-CM | POA: Diagnosis not present

## 2020-10-01 DIAGNOSIS — G894 Chronic pain syndrome: Secondary | ICD-10-CM | POA: Diagnosis not present

## 2020-10-07 DIAGNOSIS — G894 Chronic pain syndrome: Secondary | ICD-10-CM | POA: Diagnosis not present

## 2020-10-23 DIAGNOSIS — M47816 Spondylosis without myelopathy or radiculopathy, lumbar region: Secondary | ICD-10-CM | POA: Diagnosis not present

## 2020-10-31 DIAGNOSIS — G894 Chronic pain syndrome: Secondary | ICD-10-CM | POA: Diagnosis not present

## 2020-10-31 DIAGNOSIS — M545 Low back pain, unspecified: Secondary | ICD-10-CM | POA: Diagnosis not present

## 2020-11-04 DIAGNOSIS — G894 Chronic pain syndrome: Secondary | ICD-10-CM | POA: Diagnosis not present

## 2020-11-04 DIAGNOSIS — M545 Low back pain, unspecified: Secondary | ICD-10-CM | POA: Diagnosis not present

## 2020-11-05 DIAGNOSIS — H40033 Anatomical narrow angle, bilateral: Secondary | ICD-10-CM | POA: Diagnosis not present

## 2020-11-05 DIAGNOSIS — H2513 Age-related nuclear cataract, bilateral: Secondary | ICD-10-CM | POA: Diagnosis not present

## 2020-11-27 DIAGNOSIS — Z981 Arthrodesis status: Secondary | ICD-10-CM | POA: Diagnosis not present

## 2020-11-27 DIAGNOSIS — M47816 Spondylosis without myelopathy or radiculopathy, lumbar region: Secondary | ICD-10-CM | POA: Diagnosis not present

## 2020-11-27 DIAGNOSIS — Z9889 Other specified postprocedural states: Secondary | ICD-10-CM | POA: Diagnosis not present

## 2020-12-12 DIAGNOSIS — M47897 Other spondylosis, lumbosacral region: Secondary | ICD-10-CM | POA: Diagnosis not present

## 2020-12-12 DIAGNOSIS — G894 Chronic pain syndrome: Secondary | ICD-10-CM | POA: Diagnosis not present

## 2020-12-12 DIAGNOSIS — G8929 Other chronic pain: Secondary | ICD-10-CM | POA: Diagnosis not present

## 2020-12-12 DIAGNOSIS — R519 Headache, unspecified: Secondary | ICD-10-CM | POA: Diagnosis not present

## 2020-12-12 DIAGNOSIS — M48062 Spinal stenosis, lumbar region with neurogenic claudication: Secondary | ICD-10-CM | POA: Diagnosis not present

## 2020-12-12 DIAGNOSIS — M4807 Spinal stenosis, lumbosacral region: Secondary | ICD-10-CM | POA: Diagnosis not present

## 2020-12-12 DIAGNOSIS — G4700 Insomnia, unspecified: Secondary | ICD-10-CM | POA: Diagnosis not present

## 2020-12-12 DIAGNOSIS — M9931 Osseous stenosis of neural canal of cervical region: Secondary | ICD-10-CM | POA: Diagnosis not present

## 2020-12-12 DIAGNOSIS — M792 Neuralgia and neuritis, unspecified: Secondary | ICD-10-CM | POA: Diagnosis not present

## 2020-12-12 DIAGNOSIS — M9941 Connective tissue stenosis of neural canal of cervical region: Secondary | ICD-10-CM | POA: Diagnosis not present

## 2020-12-12 DIAGNOSIS — M9951 Intervertebral disc stenosis of neural canal of cervical region: Secondary | ICD-10-CM | POA: Diagnosis not present

## 2020-12-12 DIAGNOSIS — M25559 Pain in unspecified hip: Secondary | ICD-10-CM | POA: Diagnosis not present

## 2020-12-12 DIAGNOSIS — M79609 Pain in unspecified limb: Secondary | ICD-10-CM | POA: Diagnosis not present

## 2020-12-24 DIAGNOSIS — G8929 Other chronic pain: Secondary | ICD-10-CM | POA: Diagnosis not present

## 2020-12-24 DIAGNOSIS — M7918 Myalgia, other site: Secondary | ICD-10-CM | POA: Diagnosis not present

## 2020-12-30 DIAGNOSIS — M542 Cervicalgia: Secondary | ICD-10-CM | POA: Diagnosis not present

## 2020-12-30 DIAGNOSIS — M545 Low back pain, unspecified: Secondary | ICD-10-CM | POA: Diagnosis not present

## 2020-12-30 DIAGNOSIS — M5136 Other intervertebral disc degeneration, lumbar region: Secondary | ICD-10-CM | POA: Diagnosis not present

## 2020-12-30 DIAGNOSIS — G894 Chronic pain syndrome: Secondary | ICD-10-CM | POA: Diagnosis not present

## 2021-01-27 DIAGNOSIS — M1990 Unspecified osteoarthritis, unspecified site: Secondary | ICD-10-CM | POA: Diagnosis not present

## 2021-01-27 DIAGNOSIS — G894 Chronic pain syndrome: Secondary | ICD-10-CM | POA: Diagnosis not present

## 2021-01-27 DIAGNOSIS — M545 Low back pain, unspecified: Secondary | ICD-10-CM | POA: Diagnosis not present

## 2021-01-27 DIAGNOSIS — M179 Osteoarthritis of knee, unspecified: Secondary | ICD-10-CM | POA: Diagnosis not present

## 2021-01-29 DIAGNOSIS — M545 Low back pain, unspecified: Secondary | ICD-10-CM | POA: Diagnosis not present

## 2021-01-29 DIAGNOSIS — G894 Chronic pain syndrome: Secondary | ICD-10-CM | POA: Diagnosis not present

## 2021-02-24 DIAGNOSIS — M545 Low back pain, unspecified: Secondary | ICD-10-CM | POA: Diagnosis not present

## 2021-02-24 DIAGNOSIS — M5136 Other intervertebral disc degeneration, lumbar region: Secondary | ICD-10-CM | POA: Diagnosis not present

## 2021-02-24 DIAGNOSIS — G894 Chronic pain syndrome: Secondary | ICD-10-CM | POA: Diagnosis not present

## 2021-02-24 DIAGNOSIS — M1990 Unspecified osteoarthritis, unspecified site: Secondary | ICD-10-CM | POA: Diagnosis not present

## 2021-02-28 DIAGNOSIS — G894 Chronic pain syndrome: Secondary | ICD-10-CM | POA: Diagnosis not present

## 2021-03-26 DIAGNOSIS — G894 Chronic pain syndrome: Secondary | ICD-10-CM | POA: Diagnosis not present

## 2021-03-26 DIAGNOSIS — M792 Neuralgia and neuritis, unspecified: Secondary | ICD-10-CM | POA: Diagnosis not present

## 2021-03-26 DIAGNOSIS — M5136 Other intervertebral disc degeneration, lumbar region: Secondary | ICD-10-CM | POA: Diagnosis not present

## 2021-03-26 DIAGNOSIS — M79609 Pain in unspecified limb: Secondary | ICD-10-CM | POA: Diagnosis not present

## 2021-03-26 DIAGNOSIS — R519 Headache, unspecified: Secondary | ICD-10-CM | POA: Diagnosis not present

## 2021-03-26 DIAGNOSIS — G8929 Other chronic pain: Secondary | ICD-10-CM | POA: Diagnosis not present

## 2021-03-26 DIAGNOSIS — M47897 Other spondylosis, lumbosacral region: Secondary | ICD-10-CM | POA: Diagnosis not present

## 2021-03-26 DIAGNOSIS — M48062 Spinal stenosis, lumbar region with neurogenic claudication: Secondary | ICD-10-CM | POA: Diagnosis not present

## 2021-03-26 DIAGNOSIS — M25519 Pain in unspecified shoulder: Secondary | ICD-10-CM | POA: Diagnosis not present

## 2021-03-26 DIAGNOSIS — M542 Cervicalgia: Secondary | ICD-10-CM | POA: Diagnosis not present

## 2021-03-26 DIAGNOSIS — M179 Osteoarthritis of knee, unspecified: Secondary | ICD-10-CM | POA: Diagnosis not present

## 2021-03-26 DIAGNOSIS — G4700 Insomnia, unspecified: Secondary | ICD-10-CM | POA: Diagnosis not present

## 2021-03-26 DIAGNOSIS — M25559 Pain in unspecified hip: Secondary | ICD-10-CM | POA: Diagnosis not present

## 2021-03-26 DIAGNOSIS — M9951 Intervertebral disc stenosis of neural canal of cervical region: Secondary | ICD-10-CM | POA: Diagnosis not present

## 2021-03-30 DIAGNOSIS — M545 Low back pain, unspecified: Secondary | ICD-10-CM | POA: Diagnosis not present

## 2021-03-30 DIAGNOSIS — G894 Chronic pain syndrome: Secondary | ICD-10-CM | POA: Diagnosis not present

## 2021-04-21 DIAGNOSIS — M5030 Other cervical disc degeneration, unspecified cervical region: Secondary | ICD-10-CM | POA: Diagnosis not present

## 2021-04-21 DIAGNOSIS — M179 Osteoarthritis of knee, unspecified: Secondary | ICD-10-CM | POA: Diagnosis not present

## 2021-04-21 DIAGNOSIS — M542 Cervicalgia: Secondary | ICD-10-CM | POA: Diagnosis not present

## 2021-04-21 DIAGNOSIS — G894 Chronic pain syndrome: Secondary | ICD-10-CM | POA: Diagnosis not present

## 2021-04-29 DIAGNOSIS — M545 Low back pain, unspecified: Secondary | ICD-10-CM | POA: Diagnosis not present

## 2021-04-29 DIAGNOSIS — G894 Chronic pain syndrome: Secondary | ICD-10-CM | POA: Diagnosis not present

## 2021-05-19 DIAGNOSIS — G894 Chronic pain syndrome: Secondary | ICD-10-CM | POA: Diagnosis not present

## 2021-05-19 DIAGNOSIS — M5134 Other intervertebral disc degeneration, thoracic region: Secondary | ICD-10-CM | POA: Diagnosis not present

## 2021-05-19 DIAGNOSIS — M5030 Other cervical disc degeneration, unspecified cervical region: Secondary | ICD-10-CM | POA: Diagnosis not present

## 2021-05-19 DIAGNOSIS — M179 Osteoarthritis of knee, unspecified: Secondary | ICD-10-CM | POA: Diagnosis not present

## 2021-05-29 DIAGNOSIS — G894 Chronic pain syndrome: Secondary | ICD-10-CM | POA: Diagnosis not present

## 2021-06-10 DIAGNOSIS — M533 Sacrococcygeal disorders, not elsewhere classified: Secondary | ICD-10-CM | POA: Diagnosis not present

## 2021-06-16 DIAGNOSIS — G894 Chronic pain syndrome: Secondary | ICD-10-CM | POA: Diagnosis not present

## 2021-06-28 DIAGNOSIS — G894 Chronic pain syndrome: Secondary | ICD-10-CM | POA: Diagnosis not present

## 2021-07-09 DIAGNOSIS — M25551 Pain in right hip: Secondary | ICD-10-CM | POA: Diagnosis not present

## 2021-07-09 DIAGNOSIS — M25552 Pain in left hip: Secondary | ICD-10-CM | POA: Diagnosis not present

## 2021-07-09 DIAGNOSIS — M4856XA Collapsed vertebra, not elsewhere classified, lumbar region, initial encounter for fracture: Secondary | ICD-10-CM | POA: Diagnosis not present

## 2021-07-09 DIAGNOSIS — M545 Low back pain, unspecified: Secondary | ICD-10-CM | POA: Diagnosis not present

## 2021-07-09 DIAGNOSIS — G8929 Other chronic pain: Secondary | ICD-10-CM | POA: Diagnosis not present

## 2021-07-09 DIAGNOSIS — S32040A Wedge compression fracture of fourth lumbar vertebra, initial encounter for closed fracture: Secondary | ICD-10-CM | POA: Diagnosis not present

## 2021-07-09 DIAGNOSIS — M47816 Spondylosis without myelopathy or radiculopathy, lumbar region: Secondary | ICD-10-CM | POA: Diagnosis not present

## 2021-08-05 ENCOUNTER — Other Ambulatory Visit: Payer: Self-pay | Admitting: Family Medicine

## 2021-08-05 DIAGNOSIS — Z720 Tobacco use: Secondary | ICD-10-CM

## 2021-08-05 DIAGNOSIS — M7918 Myalgia, other site: Secondary | ICD-10-CM

## 2021-08-14 ENCOUNTER — Other Ambulatory Visit: Payer: Self-pay

## 2021-08-14 ENCOUNTER — Ambulatory Visit
Admission: RE | Admit: 2021-08-14 | Discharge: 2021-08-14 | Disposition: A | Discharge status: Home / Self Care | Payer: PPO | Source: Ambulatory Visit | Attending: Family Medicine | Admitting: Family Medicine

## 2021-08-14 DIAGNOSIS — M7918 Myalgia, other site: Secondary | ICD-10-CM

## 2021-08-19 ENCOUNTER — Other Ambulatory Visit: Payer: Self-pay | Admitting: Family Medicine

## 2021-08-19 ENCOUNTER — Other Ambulatory Visit: Payer: Self-pay

## 2021-08-19 ENCOUNTER — Ambulatory Visit
Admission: RE | Admit: 2021-08-19 | Discharge: 2021-08-19 | Disposition: A | Discharge status: Home / Self Care | Payer: PPO | Source: Ambulatory Visit | Attending: Family Medicine | Admitting: Family Medicine

## 2021-08-19 DIAGNOSIS — R0602 Shortness of breath: Secondary | ICD-10-CM

## 2021-08-20 ENCOUNTER — Other Ambulatory Visit: Payer: Self-pay | Admitting: Family Medicine

## 2021-08-20 DIAGNOSIS — R7989 Other specified abnormal findings of blood chemistry: Secondary | ICD-10-CM

## 2021-08-20 DIAGNOSIS — R9389 Abnormal findings on diagnostic imaging of other specified body structures: Secondary | ICD-10-CM

## 2021-08-21 ENCOUNTER — Ambulatory Visit
Admission: RE | Admit: 2021-08-21 | Discharge: 2021-08-21 | Disposition: A | Discharge status: Home / Self Care | Payer: PPO | Source: Ambulatory Visit | Attending: Family Medicine | Admitting: Family Medicine

## 2021-08-21 DIAGNOSIS — R7989 Other specified abnormal findings of blood chemistry: Secondary | ICD-10-CM

## 2021-08-21 DIAGNOSIS — R9389 Abnormal findings on diagnostic imaging of other specified body structures: Secondary | ICD-10-CM

## 2021-08-21 MED ORDER — IOPAMIDOL (ISOVUE-300) INJECTION 61%
75.0000 mL | Freq: Once | INTRAVENOUS | Status: AC | PRN
  Administered 2021-08-21: 75 mL via INTRAVENOUS

## 2021-08-28 ENCOUNTER — Inpatient Hospital Stay: Admission: RE | Admit: 2021-08-28 | Payer: PPO | Source: Ambulatory Visit

## 2021-09-18 ENCOUNTER — Telehealth: Payer: Self-pay | Admitting: Physician Assistant

## 2021-09-18 NOTE — Telephone Encounter (Signed)
Scheduled appt per 3/1 referral. Pt is aware of appt date and time. Pt is aware to arrive 15 mins prior to appt time and to bring and updated insurance card. Pt is aware of appt location.   ?

## 2021-09-19 ENCOUNTER — Inpatient Hospital Stay: Payer: PPO | Attending: Physician Assistant | Admitting: Physician Assistant

## 2021-09-19 ENCOUNTER — Inpatient Hospital Stay: Payer: PPO

## 2021-09-19 ENCOUNTER — Encounter: Payer: Self-pay | Admitting: Physician Assistant

## 2021-09-19 ENCOUNTER — Other Ambulatory Visit: Payer: Self-pay

## 2021-09-19 VITALS — BP 155/81 | HR 88 | Temp 97.7°F | Resp 18 | Wt 120.3 lb

## 2021-09-19 DIAGNOSIS — R768 Other specified abnormal immunological findings in serum: Secondary | ICD-10-CM | POA: Insufficient documentation

## 2021-09-19 DIAGNOSIS — M808AXS Other osteoporosis with current pathological fracture, other site, sequela: Secondary | ICD-10-CM | POA: Insufficient documentation

## 2021-09-19 DIAGNOSIS — M1990 Unspecified osteoarthritis, unspecified site: Secondary | ICD-10-CM | POA: Diagnosis not present

## 2021-09-19 DIAGNOSIS — M419 Scoliosis, unspecified: Secondary | ICD-10-CM | POA: Diagnosis not present

## 2021-09-19 DIAGNOSIS — Z85828 Personal history of other malignant neoplasm of skin: Secondary | ICD-10-CM | POA: Diagnosis not present

## 2021-09-19 DIAGNOSIS — R609 Edema, unspecified: Secondary | ICD-10-CM | POA: Diagnosis not present

## 2021-09-19 DIAGNOSIS — Z87891 Personal history of nicotine dependence: Secondary | ICD-10-CM | POA: Diagnosis not present

## 2021-09-19 DIAGNOSIS — G510 Bell's palsy: Secondary | ICD-10-CM | POA: Insufficient documentation

## 2021-09-19 DIAGNOSIS — Z7982 Long term (current) use of aspirin: Secondary | ICD-10-CM | POA: Diagnosis not present

## 2021-09-19 DIAGNOSIS — Z79899 Other long term (current) drug therapy: Secondary | ICD-10-CM | POA: Diagnosis not present

## 2021-09-19 DIAGNOSIS — M8448XD Pathological fracture, other site, subsequent encounter for fracture with routine healing: Secondary | ICD-10-CM

## 2021-09-19 DIAGNOSIS — M8448XS Pathological fracture, other site, sequela: Secondary | ICD-10-CM

## 2021-09-19 LAB — CMP (CANCER CENTER ONLY)
ALT: 11 U/L (ref 0–44)
AST: 15 U/L (ref 15–41)
Albumin: 4 g/dL (ref 3.5–5.0)
Alkaline Phosphatase: 156 U/L — ABNORMAL HIGH (ref 38–126)
Anion gap: 6 (ref 5–15)
BUN: 10 mg/dL (ref 6–20)
CO2: 30 mmol/L (ref 22–32)
Calcium: 9.2 mg/dL (ref 8.9–10.3)
Chloride: 102 mmol/L (ref 98–111)
Creatinine: 0.56 mg/dL (ref 0.44–1.00)
GFR, Estimated: >60 mL/min (ref >60)
Glucose, Bld: 83 mg/dL (ref 70–99)
Potassium: 4.5 mmol/L (ref 3.5–5.1)
Sodium: 138 mmol/L (ref 135–145)
Total Bilirubin: 0.2 mg/dL — ABNORMAL LOW (ref 0.3–1.2)
Total Protein: 7.7 g/dL (ref 6.5–8.1)

## 2021-09-19 LAB — CBC WITH DIFFERENTIAL (CANCER CENTER ONLY)
Abs Immature Granulocytes: 0.02 10*3/uL (ref 0.00–0.07)
Basophils Absolute: 0 10*3/uL (ref 0.0–0.1)
Basophils Relative: 0 %
Eosinophils Absolute: 0.1 10*3/uL (ref 0.0–0.5)
Eosinophils Relative: 2 %
HCT: 35 % — ABNORMAL LOW (ref 36.0–46.0)
Hemoglobin: 11 g/dL — ABNORMAL LOW (ref 12.0–15.0)
Immature Granulocytes: 0 %
Lymphocytes Relative: 34 %
Lymphs Abs: 1.9 10*3/uL (ref 0.7–4.0)
MCH: 29.5 pg (ref 26.0–34.0)
MCHC: 31.4 g/dL (ref 30.0–36.0)
MCV: 93.8 fL (ref 80.0–100.0)
Monocytes Absolute: 0.3 10*3/uL (ref 0.1–1.0)
Monocytes Relative: 6 %
Neutro Abs: 3.2 10*3/uL (ref 1.7–7.7)
Neutrophils Relative %: 58 %
Platelet Count: 288 10*3/uL (ref 150–400)
RBC: 3.73 MIL/uL — ABNORMAL LOW (ref 3.87–5.11)
RDW: 14.9 % (ref 11.5–15.5)
WBC Count: 5.6 10*3/uL (ref 4.0–10.5)
nRBC: 0 % (ref 0.0–0.2)

## 2021-09-19 LAB — LACTATE DEHYDROGENASE: LDH: 142 U/L (ref 98–192)

## 2021-09-19 NOTE — Progress Notes (Signed)
[facility] [phone]   [phone]  INITIAL CONSULTATION:  Patient Care Team: Leonard Downing, MD as PCP - General (Family Medicine)  CHIEF COMPLAINTS/PURPOSE OF CONSULTATION:  Bilateral acute sacral insufficiency fractures with severe bone marrow edema  HISTORY OF PRESENTING ILLNESS:  Sheryl Campbell 61 y.o. female with medical history significant for arthritis, osteoporosis, bell's palsy, skin cancer, scoliosis and sciatica. She is accompanied by her husband for this visit.   On review of the previous records, Sheryl Campbell presented to her PCP due to low back pain and bilateral groin pain since December 2022. MRI of the lumbar spine was obtained on 08/14/2021 that showed bilateral acute sacral insufficiency fractures with severe surrounding bone marrow edema. Serum SPEP with IFE was ordered on 02/06/20223 that showed elevated IgA levels but no evidence of monoclonal protein. Additionally, serum free light chains and UPEP were obtained and both were unremarkable.   On exam today, Sheryl Campbell reports some improvement of low back pain and groin pain although symptoms are intermittent. She was recently using a walker to help ambulate but not at this time. She reports having history of osteoporosis and prior fractures in the last two years. She reports chronic fatigue but is able to complete her daily activities on her own. She denies any appetite or weight changes. She denies any nausea, vomiting or abdominal pain. Her bowel habits are regular without any diarrhea or constipation. She denies easy bruising or signs of active bleeding. She denies fevers, chills, night sweats, shortness of breath, chest pain or cough. She has no other complaints. Rest of the 10 point ROS is below.   MEDICAL HISTORY:  Past Medical History:  Diagnosis Date   Arthritis    Bell's palsy    Cancer ([place])    skin (not melanoma)   Fibromyalgia     Intervertebral disk disease    "my whole spine"   Osteopenia    Sciatica    Scoliosis     SURGICAL HISTORY: Past Surgical History:  Procedure Laterality Date   BACK SURGERY  05/2004   Harrington rods T3- T11   BUNIONECTOMY Bilateral 07/2001   CHOLECYSTECTOMY  02/2000   NECK SURGERY  10/2002   cervical diskectomy, C5-C7   NOSE SURGERY  07/1993   deviated septum   ORIF ANKLE FRACTURE Right 12/18/2019   Procedure: OPEN REDUCTION INTERNAL FIXATION RIGHT ANKLE FRACTURE;  Surgeon: Meredith Pel, MD;  Location: [facility];  Service: Orthopedics;  Laterality: Right;    SOCIAL HISTORY: Social History   Socioeconomic History   Marital status: Married    Spouse name: Fritz Pickerel   Number of children: 0   Years of education: 12   Highest education level: Not on file  Occupational History    Comment: disabled  Tobacco Use   Smoking status: Former    Packs/day: 1.00    Years: 40.00    Pack years: 40.00    Types: Cigarettes    Quit date: 07/21/2021    Years since quitting: 0.1   Smokeless tobacco: Never  Substance and Sexual Activity   Alcohol use: No    Comment: rare   Drug use: No   Sexual activity: Not on file  Other Topics Concern   Not on file  Social History Narrative   Lives with husband   Caffeine- coffee, 2-3 cups daily; sodas 1 daily   Social Determinants of Health   Financial Resource Strain: Not on file  Food Insecurity:  Not on file  Transportation Needs: Not on file  Physical Activity: Not on file  Stress: Not on file  Social Connections: Not on file  Intimate Partner Violence: Not on file    FAMILY HISTORY: Family History  Problem Relation Age of Onset   Cancer - Lung Mother        smoker   Diabetes Sister    Diabetes Brother    Cancer Maternal Aunt        Anal   Breast cancer Maternal Aunt    Lung cancer Maternal Aunt     ALLERGIES:  has No Known Allergies.  MEDICATIONS:  Current Outpatient Medications  Medication Sig Dispense Refill    alendronate (FOSAMAX) 70 MG tablet SMARTSIG:1 Tablet(s) By Mouth 6 Times a Week     ARIPiprazole (ABILIFY) 2 MG tablet Take 2 mg by mouth daily.      baclofen (LIORESAL) 10 MG tablet Take 1 tablet by mouth every 6 (six) hours as needed for pain.     diclofenac Sodium (VOLTAREN) 1 % GEL Apply 1 application topically 4 (four) times daily as needed for pain.     DULoxetine (CYMBALTA) 60 MG capsule Take 60 mg by mouth daily.     fentaNYL (DURAGESIC) 50 MCG/HR 1 patch every 3 (three) days.     gabapentin (NEURONTIN) 300 MG capsule Take 300 mg by mouth 4 (four) times daily.      HYDROcodone-acetaminophen (NORCO) 10-325 MG tablet Take 1 tablet by mouth 2 (two) times daily as needed for moderate pain.      Multiple Vitamin (MULTIVITAMIN ADULT PO) Take 1 tablet by mouth daily.     POTASSIUM PO Take 1 tablet by mouth daily.     Turmeric (QC TUMERIC COMPLEX PO) Take 1 tablet by mouth daily.     aspirin 81 MG chewable tablet Chew 1 tablet (81 mg total) by mouth 2 (two) times daily. (Patient not taking: Reported on 09/19/2021) 30 tablet 0   No current facility-administered medications for this visit.    REVIEW OF SYSTEMS:   Constitutional: ( - ) fevers, ( - )  chills , ( - ) night sweats Eyes: ( - ) blurriness of vision, ( - ) double vision, ( - ) watery eyes Ears, nose, mouth, throat, and face: ( - ) mucositis, ( - ) sore throat Respiratory: ( - ) cough, ( - ) dyspnea, ( - ) wheezes Cardiovascular: ( - ) palpitation, ( - ) chest discomfort, ( - ) lower extremity swelling Gastrointestinal:  ( - ) nausea, ( - ) heartburn, ( - ) change in bowel habits Skin: ( - ) abnormal skin rashes Lymphatics: ( - ) new lymphadenopathy, ( - ) easy bruising Neurological: ( - ) numbness, ( - ) tingling, ( - ) new weaknesses Behavioral/Psych: ( - ) mood change, ( - ) new changes  All other systems were reviewed with the patient and are negative.  PHYSICAL EXAMINATION: ECOG PERFORMANCE STATUS: 1 - Symptomatic but  completely ambulatory  Vitals:   09/19/21 1153  BP: (!) 155/81  Pulse: 88  Resp: 18  Temp: 97.7 F (36.5 C)  SpO2: 96%   Filed Weights   09/19/21 1153  Weight: 120 lb 4.8 oz (54.6 kg)    GENERAL: well appearing female in NAD  SKIN: skin color, texture, turgor are normal, no rashes or significant lesions EYES: conjunctiva are pink and non-injected, sclera clear OROPHARYNX: no exudate, no erythema; lips, buccal mucosa, and tongue normal  NECK:  supple, non-tender LYMPH:  no palpable lymphadenopathy in the cervical or supraclavicular lymph nodes.  LUNGS: clear to auscultation and percussion with normal breathing effort HEART: regular rate & rhythm and no murmurs and no lower extremity edema ABDOMEN: soft, non-tender, non-distended, normal bowel sounds Musculoskeletal: no cyanosis of digits and no clubbing  PSYCH: alert & oriented x 3, fluent speech NEURO: no focal motor/sensory deficits  LABORATORY DATA:  I have reviewed the data as listed CBC Latest Ref Rng & Units 09/19/2021 12/19/2019 12/18/2019  WBC 4.0 - 10.5 K/uL 5.6 10.1 5.6  Hemoglobin 12.0 - 15.0 g/dL 11.0(L) 9.4(L) 9.5(L)  Hematocrit 36.0 - 46.0 % 35.0(L) 30.3(L) 30.5(L)  Platelets 150 - 400 K/uL 288 444(H) 388    CMP Latest Ref Rng & Units 09/19/2021 12/19/2019 12/18/2019  Glucose 70 - 99 mg/dL 83 87 94  BUN 6 - 20 mg/dL 10 7 <5(L)  Creatinine 0.44 - 1.00 mg/dL 0.56 0.48 0.43(L)  Sodium 135 - 145 mmol/L 138 140 139  Potassium 3.5 - 5.1 mmol/L 4.5 3.7 3.0(L)  Chloride 98 - 111 mmol/L 102 103 99  CO2 22 - 32 mmol/L '30 26 29  ' Calcium 8.9 - 10.3 mg/dL 9.2 8.8(L) 8.8(L)  Total Protein 6.5 - 8.1 g/dL 7.7 - -  Total Bilirubin 0.3 - 1.2 mg/dL 0.2(L) - -  Alkaline Phos 38 - 126 U/L 156(H) - -  AST 15 - 41 U/L 15 - -  ALT 0 - 44 U/L 11 - -     RADIOGRAPHIC STUDIES: I have personally reviewed the radiological images as listed and agreed with the findings in the report. No results found.  ASSESSMENT & PLAN LIBIA FAZZINI is a 62 y.o. female who presents to the diagnostic clinic for evaluation for recent MRI lumbar scan that showed bilateral acute sacral insufficiency fractures with severe bone marrow edema. I reviewed possible etiologies including plasma cell disorders such as multiple myeloma versus patient's diagnosis of osteoporosis that increases risk of fractures. We reviewed outside labs including SPEP with IFE, serum free light chains and UPEP. Findings did not show any evidence of monoclonal protein so plasma cell disorders are unlikely the cause. We will repeat labs today to check CBC, CMP, LDH, SPEP/IFE and sFLC. If today's labs are unremarkable, we will not pursue additional workup.   #Bilateral acute sacral insufficiency fractures with severe bone marrow edema: --Likely secondary to osteoporosis  --Labs today to check CBC, CMP, LDH, SPEP/IFE and sFLC --RTC once workup is complete.    Orders Placed This Encounter  Procedures   CBC with Differential ([facility] Only)    Standing Status:   Future    Number of Occurrences:   1    Standing Expiration Date:   09/20/2022   CMP ([facility] only)    Standing Status:   Future    Number of Occurrences:   1    Standing Expiration Date:   09/20/2022   Lactate dehydrogenase (LDH)    Standing Status:   Future    Number of Occurrences:   1    Standing Expiration Date:   09/19/2022   Multiple Myeloma Panel (SPEP&IFE w/QIG)    Standing Status:   Future    Number of Occurrences:   1    Standing Expiration Date:   09/19/2022   Kappa/lambda light chains    Standing Status:   Future    Number of Occurrences:   1    Standing Expiration Date:   09/19/2022  All questions were answered. The patient knows to call the clinic with any problems, questions or concerns.  I have spent a total of 60 minutes minutes of face-to-face and non-face-to-face time, preparing to see the patient, obtaining and/or reviewing separately obtained history, performing a medically  appropriate examination, counseling and educating the patient, ordering tests, ocumenting clinical information in the electronic health record, and care coordination.   Dede Query, PA-C Department of Hematology/Oncology [place] at [facility] Phone: [phone]  Patient was seen with Dr. Lorenso Courier  I have read the above note and personally examined the patient. I agree with the assessment and plan as noted above.  Briefly Sheryl Campbell is a 61 year old female who presents for evaluation of hip fracture.  There is concern that these hip fractures may be due to multiple myeloma rather than osteoporosis.  At this time we have a lower clinical suspicion this is secondary to multiple myeloma.  In order to further evaluate this we will order SPEP, serum free light chains, LDH, CBC, CMP in order to evaluate for this.  In the event that there are no concerning findings for multiple myeloma would recommend continued follow-up with primary care provider and orthopedic surgery.  The patient voiced understanding of this plan moving forward.   Ledell Peoples, MD Department of Hematology/Oncology [place] at [facility] Phone: [phone] Pager: [phone] Email: [email or web address]

## 2021-09-22 DIAGNOSIS — M8448XA Pathological fracture, other site, initial encounter for fracture: Secondary | ICD-10-CM | POA: Insufficient documentation

## 2021-09-22 LAB — KAPPA/LAMBDA LIGHT CHAINS
Kappa free light chain: 22.9 mg/L — ABNORMAL HIGH (ref 3.3–19.4)
Kappa, lambda light chain ratio: 1.35 (ref 0.26–1.65)
Lambda free light chains: 17 mg/L (ref 5.7–26.3)

## 2021-09-24 ENCOUNTER — Telehealth: Payer: Self-pay | Admitting: Physician Assistant

## 2021-09-24 LAB — MULTIPLE MYELOMA PANEL, SERUM
Albumin SerPl Elph-Mcnc: 3.5 g/dL (ref 2.9–4.4)
Albumin/Glob SerPl: 1 (ref 0.7–1.7)
Alpha 1: 0.3 g/dL (ref 0.0–0.4)
Alpha2 Glob SerPl Elph-Mcnc: 1 g/dL (ref 0.4–1.0)
B-Globulin SerPl Elph-Mcnc: 1.1 g/dL (ref 0.7–1.3)
Gamma Glob SerPl Elph-Mcnc: 1.2 g/dL (ref 0.4–1.8)
Globulin, Total: 3.7 g/dL (ref 2.2–3.9)
IgA: 466 mg/dL — ABNORMAL HIGH (ref 87–352)
IgG (Immunoglobin G), Serum: 1065 mg/dL (ref 586–1602)
IgM (Immunoglobulin M), Srm: 149 mg/dL (ref 26–217)
Total Protein ELP: 7.2 g/dL (ref 6.0–8.5)

## 2021-09-25 NOTE — Telephone Encounter (Addendum)
I called Ms. Sheryl Campbell to review the lab results from 09/19/2021. Findings show no evidence of monoclonal protein to suggest a plasma cell disorder such as multiple myeloma. Anemia has improved from prior levels to 11.0 g/dL. Dr. Lorenso Courier and I do not recommend additional workup at this time. Patient will follow up with her PCP and ortho surgery to continue to manage her sacral fractures. She is aware to return if she develops any new or worsening symptoms.  ? ?She expressed understanding and satisfaction with the plan provided.  ?

## 2021-09-25 NOTE — Telephone Encounter (Signed)
Today"s telephone note and 09/19/21 office note faxed to PCP Dr Arelia Sneddon.  Confirmation received ?

## 2022-03-10 ENCOUNTER — Emergency Department (HOSPITAL_COMMUNITY): Payer: PPO

## 2022-03-10 ENCOUNTER — Other Ambulatory Visit: Payer: Self-pay

## 2022-03-10 ENCOUNTER — Inpatient Hospital Stay (HOSPITAL_COMMUNITY)
Admission: EM | Admit: 2022-03-10 | Discharge: 2022-03-13 | DRG: 193 | Disposition: A | Discharge status: Home / Self Care | Payer: PPO | Source: Ambulatory Visit | Attending: Internal Medicine | Admitting: Internal Medicine

## 2022-03-10 ENCOUNTER — Encounter (HOSPITAL_COMMUNITY): Payer: Self-pay | Admitting: Internal Medicine

## 2022-03-10 DIAGNOSIS — Z20822 Contact with and (suspected) exposure to covid-19: Secondary | ICD-10-CM | POA: Diagnosis present

## 2022-03-10 DIAGNOSIS — Z7983 Long term (current) use of bisphosphonates: Secondary | ICD-10-CM

## 2022-03-10 DIAGNOSIS — J189 Pneumonia, unspecified organism: Principal | ICD-10-CM | POA: Diagnosis present

## 2022-03-10 DIAGNOSIS — M8580 Other specified disorders of bone density and structure, unspecified site: Secondary | ICD-10-CM | POA: Diagnosis present

## 2022-03-10 DIAGNOSIS — M797 Fibromyalgia: Secondary | ICD-10-CM | POA: Diagnosis present

## 2022-03-10 DIAGNOSIS — F17200 Nicotine dependence, unspecified, uncomplicated: Secondary | ICD-10-CM | POA: Diagnosis present

## 2022-03-10 DIAGNOSIS — M1990 Unspecified osteoarthritis, unspecified site: Secondary | ICD-10-CM | POA: Diagnosis present

## 2022-03-10 DIAGNOSIS — F17210 Nicotine dependence, cigarettes, uncomplicated: Secondary | ICD-10-CM | POA: Diagnosis present

## 2022-03-10 DIAGNOSIS — R0902 Hypoxemia: Principal | ICD-10-CM

## 2022-03-10 DIAGNOSIS — Z7982 Long term (current) use of aspirin: Secondary | ICD-10-CM

## 2022-03-10 DIAGNOSIS — J9601 Acute respiratory failure with hypoxia: Secondary | ICD-10-CM | POA: Diagnosis present

## 2022-03-10 DIAGNOSIS — D649 Anemia, unspecified: Secondary | ICD-10-CM | POA: Diagnosis present

## 2022-03-10 DIAGNOSIS — Z85828 Personal history of other malignant neoplasm of skin: Secondary | ICD-10-CM

## 2022-03-10 DIAGNOSIS — Z79899 Other long term (current) drug therapy: Secondary | ICD-10-CM

## 2022-03-10 LAB — CBC WITH DIFFERENTIAL/PLATELET
Abs Immature Granulocytes: 0.03 10*3/uL (ref 0.00–0.07)
Basophils Absolute: 0 10*3/uL (ref 0.0–0.1)
Basophils Relative: 0 %
Eosinophils Absolute: 0.1 10*3/uL (ref 0.0–0.5)
Eosinophils Relative: 1 %
HCT: 36.7 % (ref 36.0–46.0)
Hemoglobin: 11.6 g/dL — ABNORMAL LOW (ref 12.0–15.0)
Immature Granulocytes: 0 %
Lymphocytes Relative: 10 %
Lymphs Abs: 0.8 10*3/uL (ref 0.7–4.0)
MCH: 28.6 pg (ref 26.0–34.0)
MCHC: 31.6 g/dL (ref 30.0–36.0)
MCV: 90.4 fL (ref 80.0–100.0)
Monocytes Absolute: 0.3 10*3/uL (ref 0.1–1.0)
Monocytes Relative: 3 %
Neutro Abs: 6.8 10*3/uL (ref 1.7–7.7)
Neutrophils Relative %: 86 %
Platelets: 305 10*3/uL (ref 150–400)
RBC: 4.06 MIL/uL (ref 3.87–5.11)
RDW: 15.9 % — ABNORMAL HIGH (ref 11.5–15.5)
WBC: 7.9 10*3/uL (ref 4.0–10.5)
nRBC: 0 % (ref 0.0–0.2)

## 2022-03-10 LAB — BASIC METABOLIC PANEL
Anion gap: 12 (ref 5–15)
BUN: 8 mg/dL (ref 6–20)
CO2: 28 mmol/L (ref 22–32)
Calcium: 9.2 mg/dL (ref 8.9–10.3)
Chloride: 98 mmol/L (ref 98–111)
Creatinine, Ser: 0.5 mg/dL (ref 0.44–1.00)
GFR, Estimated: >60 mL/min (ref >60)
Glucose, Bld: 109 mg/dL — ABNORMAL HIGH (ref 70–99)
Potassium: 3.5 mmol/L (ref 3.5–5.1)
Sodium: 138 mmol/L (ref 135–145)

## 2022-03-10 LAB — SARS CORONAVIRUS 2 BY RT PCR: SARS Coronavirus 2 by RT PCR: NEGATIVE

## 2022-03-10 MED ORDER — ALBUTEROL SULFATE (2.5 MG/3ML) 0.083% IN NEBU: 2.5000 mg | INHALATION_SOLUTION | RESPIRATORY_TRACT | Status: DC | PRN

## 2022-03-10 MED ORDER — SODIUM CHLORIDE 0.9 % IV SOLN
500.0000 mg | Freq: Every day | INTRAVENOUS | Status: DC
  Administered 2022-03-11 (×2): 500 mg via INTRAVENOUS
  Filled 2022-03-10 (×3): qty 5

## 2022-03-10 MED ORDER — ENOXAPARIN SODIUM 40 MG/0.4ML IJ SOSY
40.0000 mg | PREFILLED_SYRINGE | Freq: Every day | INTRAMUSCULAR | Status: DC
  Administered 2022-03-11 – 2022-03-12 (×3): 40 mg via SUBCUTANEOUS
  Filled 2022-03-10 (×3): qty 0.4

## 2022-03-10 MED ORDER — SODIUM CHLORIDE 0.9 % IV SOLN
2.0000 g | Freq: Every day | INTRAVENOUS | Status: DC
  Administered 2022-03-11 – 2022-03-12 (×2): 2 g via INTRAVENOUS
  Filled 2022-03-10 (×2): qty 20

## 2022-03-10 NOTE — ED Triage Notes (Signed)
Pt arrived with EMS from [facility]. Went there for coughing and sob for a few weeks. They did chest xray and showed PNA. Given 1 gm rocephin, 12 mg decadron, 1 duoneb. Called ems because 02 dropped to 80s.

## 2022-03-10 NOTE — H&P (Signed)
History and Physical    Sheryl Campbell WPY:[id number] DOB: 28-Sep-1960 DOA: 03/10/2022  PCP: Leonard Downing, MD  Patient coming from: Home.  Chief Complaint: Shortness of breath and cough.  HPI: Sheryl Campbell is a 61 y.o. female with history of tobacco abuse presents to the ER with shortness of breath and cough.  Patient states she has been having cough along but over the last couple of days has been worsening with generalized body ache.  Had gone to the urgent care where chest x-ray showed pneumonia and patient was easily getting hypoxic was referred to the ER.  Patient in addition also has been having left-sided pleuritic chest pain.  ED Course: In the ER patient was hypoxic requiring 2 L oxygen.  Chest x-ray again confirms multifocal pneumonia.  Patient admitted for further work-up of pneumonia requiring oxygen.  COVID test negative.  Review of Systems: As per HPI, rest all negative.   Past Medical History:  Diagnosis Date   Arthritis    Bell's palsy    Cancer ([place])    skin (not melanoma)   Fibromyalgia    Intervertebral disk disease    "my whole spine"   Osteopenia    Sciatica    Scoliosis     Past Surgical History:  Procedure Laterality Date   BACK SURGERY  05/2004   Harrington rods T3- T11   BUNIONECTOMY Bilateral 07/2001   CHOLECYSTECTOMY  02/2000   NECK SURGERY  10/2002   cervical diskectomy, C5-C7   NOSE SURGERY  07/1993   deviated septum   ORIF ANKLE FRACTURE Right 12/18/2019   Procedure: OPEN REDUCTION INTERNAL FIXATION RIGHT ANKLE FRACTURE;  Surgeon: Meredith Pel, MD;  Location: [facility];  Service: Orthopedics;  Laterality: Right;     reports that she quit smoking about 7 months ago. Her smoking use included cigarettes. She has a 40.00 pack-year smoking history. She has never used smokeless tobacco. She reports that she does not drink alcohol and does not use drugs.  No Known Allergies  Family History  Problem Relation Age of Onset   Cancer -  Lung Mother        smoker   Diabetes Sister    Diabetes Brother    Cancer Maternal Aunt        Anal   Breast cancer Maternal Aunt    Lung cancer Maternal Aunt     Prior to Admission medications   Medication Sig Start Date End Date Taking? Authorizing Provider  albuterol (VENTOLIN HFA) 108 (90 Base) MCG/ACT inhaler Inhale into the lungs See admin instructions. Inhale 1-2 puffs into the lungs  every 4-6 hours as needed for wheezing/ shortness of breath 02/25/22  Yes [provider]  alendronate (FOSAMAX) 70 MG tablet Take 70 mg by mouth once a week. 08/22/21  Yes [provider]  ARIPiprazole (ABILIFY) 2 MG tablet Take 2 mg by mouth daily.  08/19/16  Yes [provider]  baclofen (LIORESAL) 10 MG tablet Take 1 tablet by mouth every 6 (six) hours as needed for pain. 12/07/19  Yes [provider]  diclofenac Sodium (VOLTAREN) 1 % GEL Apply 1 application topically 4 (four) times daily as needed for pain. 09/12/19  Yes [provider]  DULoxetine (CYMBALTA) 60 MG capsule Take 60 mg by mouth daily. 12/11/19  Yes [provider]  fentaNYL (DURAGESIC) 50 MCG/HR 1 patch every 3 (three) days. 09/15/21  Yes [provider]  gabapentin (NEURONTIN) 300 MG capsule Take 300 mg by  mouth 4 (four) times daily.  08/24/16  Yes [provider]  HYDROcodone-acetaminophen (NORCO) 10-325 MG tablet Take 1-2 tablets by mouth 2 (two) times daily as needed for moderate pain. 08/01/16  Yes [provider]  Multiple Vitamin (MULTIVITAMIN ADULT PO) Take 1 tablet by mouth daily.   Yes [provider]  POTASSIUM PO Take 1 tablet by mouth daily.   Yes [provider]  Turmeric (QC TUMERIC COMPLEX PO) Take 1 tablet by mouth daily.   Yes [provider]  aspirin 81 MG chewable tablet Chew 1 tablet (81 mg total) by mouth 2 (two) times daily. Patient not taking: Reported on 09/19/2021 12/19/19   [name], DO    Physical  Exam: Constitutional: Moderately built and nourished. Vitals:   03/10/22 2059 03/10/22 2100 03/10/22 2130 03/10/22 2310  BP: (!) 104/90 126/67 139/60   Pulse: 81 79 82 81  Resp: 16 18 (!) 21 20  Temp:    98 F (36.7 C)  TempSrc:    Oral  SpO2: 95% 95% 95% 95%  Weight:      Height:       Eyes: Anicteric no pallor. ENMT: No discharge from the ears eyes nose and mouth. Neck: No mass felt.  No neck rigidity. Respiratory: No rhonchi or crepitations. Cardiovascular: S1-S2 heard. Abdomen: Soft nontender bowel sound present. Musculoskeletal: No edema. Skin: No rash. Neurologic: Alert awake oriented to time place and person.  Moves all extremities. Psychiatric: Appears normal.  Normal affect.   Labs on Admission: I have personally reviewed following labs and imaging studies  CBC: Recent Labs  Lab 03/10/22 1836  WBC 7.9  NEUTROABS 6.8  HGB 11.6*  HCT 36.7  MCV 90.4  PLT 505   Basic Metabolic Panel: Recent Labs  Lab 03/10/22 1836  NA 138  K 3.5  CL 98  CO2 28  GLUCOSE 109*  BUN 8  CREATININE 0.50  CALCIUM 9.2   GFR: Estimated Creatinine Clearance: 59.9 mL/min (by C-G formula based on SCr of 0.5 mg/dL). Liver Function Tests: No results for input(s): "AST", "ALT", "ALKPHOS", "BILITOT", "PROT", "ALBUMIN" in the last 168 hours. No results for input(s): "LIPASE", "AMYLASE" in the last 168 hours. No results for input(s): "AMMONIA" in the last 168 hours. Coagulation Profile: No results for input(s): "INR", "PROTIME" in the last 168 hours. Cardiac Enzymes: No results for input(s): "CKTOTAL", "CKMB", "CKMBINDEX", "TROPONINI" in the last 168 hours. BNP (last 3 results) No results for input(s): "PROBNP" in the last 8760 hours. HbA1C: No results for input(s): "HGBA1C" in the last 72 hours. CBG: No results for input(s): "GLUCAP" in the last 168 hours. Lipid Profile: No results for input(s): "CHOL", "HDL", "LDLCALC", "TRIG", "CHOLHDL", "LDLDIRECT" in the last 72  hours. Thyroid Function Tests: No results for input(s): "TSH", "T4TOTAL", "FREET4", "T3FREE", "THYROIDAB" in the last 72 hours. Anemia Panel: No results for input(s): "VITAMINB12", "FOLATE", "FERRITIN", "TIBC", "IRON", "RETICCTPCT" in the last 72 hours. Urine analysis:    Component Value Date/Time   COLORURINE YELLOW 12/15/2019 1030   APPEARANCEUR CLEAR 12/15/2019 1030   LABSPEC 1.016 12/15/2019 1030   PHURINE 5.0 12/15/2019 1030   GLUCOSEU NEGATIVE 12/15/2019 1030   HGBUR NEGATIVE 12/15/2019 1030   [place] 12/15/2019 1030   [place] 12/15/2019 1030   PROTEINUR NEGATIVE 12/15/2019 1030   NITRITE NEGATIVE 12/15/2019 1030   LEUKOCYTESUR NEGATIVE 12/15/2019 1030   Sepsis Labs: '@LABRCNTIP'$ (procalcitonin:4,lacticidven:4) ) Recent Results (from the past 240 hour(s))  SARS Coronavirus 2 by RT PCR (hospital order,  performed in [facility] hospital lab) *cepheid single result test* Anterior Nasal Swab     Status: None   Collection Time: 03/10/22  6:10 PM   Specimen: Anterior Nasal Swab  Result Value Ref Range Status   SARS Coronavirus 2 by RT PCR NEGATIVE NEGATIVE Final    Comment: (NOTE) SARS-CoV-2 target nucleic acids are NOT DETECTED.  The SARS-CoV-2 RNA is generally detectable in upper and lower respiratory specimens during the acute phase of infection. The lowest concentration of SARS-CoV-2 viral copies this assay can detect is 250 copies / mL. A negative result does not preclude SARS-CoV-2 infection and should not be used as the sole basis for treatment or other patient management decisions.  A negative result may occur with improper specimen collection / handling, submission of specimen other than nasopharyngeal swab, presence of viral mutation(s) within the areas targeted by this assay, and inadequate number of viral copies (<250 copies / mL). A negative result must be combined with clinical observations, patient history, and epidemiological  information.  Fact Sheet for Patients:   https://www.patel.info/  Fact Sheet for Healthcare Providers: https://hall.com/  This test is not yet approved or  cleared by the [place] FDA and has been authorized for detection and/or diagnosis of SARS-CoV-2 by FDA under an Emergency Use Authorization (EUA).  This EUA will remain in effect (meaning this test can be used) for the duration of the COVID-19 declaration under Section 564(b)(1) of the Act, 21 U.S.C. section 360bbb-3(b)(1), unless the authorization is terminated or revoked sooner.  Performed at [facility], [place] [address], [place], [place] [address]      Radiological Exams on Admission: DG Chest 2 View  Result Date: 03/10/2022 CLINICAL DATA:  Painful inspiration x3 days. EXAM: CHEST - 2 VIEW COMPARISON:  August 19, 2021 FINDINGS: The heart size and mediastinal contours are within normal limits. Mild to moderate severity infiltrates are seen within the mid and lower lung fields, bilaterally. Small bilateral pleural effusions are noted. No pneumothorax is identified. Radiopaque surgical clips are noted within the right upper quadrant. Postoperative changes are seen within the cervical and thoracic spine. IMPRESSION: 1. Mild to moderate severity bilateral infiltrates. 2. Small bilateral pleural effusions. Electronically Signed   By: Virgina Norfolk M.D.   On: 03/10/2022 19:22    EKG: Independently reviewed.  Normal sinus rhythm.  Assessment/Plan Principal Problem:   CAP (community acquired pneumonia)    Community-acquired pneumonia -patient has been empirically placed on antibiotics.  Check urine Legionella strep antigen sputum cultures.  CT angiogram has been ordered. Normocytic normochromic anemia appears to be chronic.  Follow CBC and further work-up as outpatient if there is no further decline in hemoglobin. Tobacco abuse strongly advised about quitting.   DVT  prophylaxis: Lovenox. Code Status: Full code. Family Communication: Discussed with patient. Disposition Plan: Home. Consults called: None. Admission status: Observation.   Rise Patience MD [facility] Pager [phone].  If 7PM-7AM, please contact night-coverage [email or web address] Password [facility]  03/10/2022, 11:29 PM

## 2022-03-10 NOTE — ED Provider Notes (Signed)
[facility] EMERGENCY DEPARTMENT Provider Note   CSN: [id number] Arrival date & time: 03/10/22  1746     History  Chief Complaint  Patient presents with   Shortness of Breath    Sheryl Campbell is a 61 y.o. female with a past medical history of tobacco use disorder and atypical pneumonia presenting today with concern for pneumonia.  Patient reports that she has been experiencing a barking cough for multiple months.  It stopped over this weekend however she then started to feel pain in her bilateral back and knew that her "lungs hurt."  Cough is occasionally productive of gray/green sputum however more recently has been more clear.  Does report smoking 1 to 1.5 packs of cigarettes a day.  She was seen at urgent care earlier today and x-ray revealed bilateral pneumonia.  She was sent to the emergency department for this and her hypoxia.  Her oxygen saturation did not rise above 83% with them.  She does not wear oxygen at baseline.  Does report feeling feverish over the weekend but Tylenol relieved the symptoms.  No history of COPD but she believes she likely has it.    Shortness of Breath Associated symptoms: cough   Associated symptoms: no fever        Home Medications Prior to Admission medications   Medication Sig Start Date End Date Taking? Authorizing Provider  alendronate (FOSAMAX) 70 MG tablet SMARTSIG:1 Tablet(s) By Mouth 6 Times a Week 08/22/21   [provider]  ARIPiprazole (ABILIFY) 2 MG tablet Take 2 mg by mouth daily.  08/19/16   [provider]  aspirin 81 MG chewable tablet Chew 1 tablet (81 mg total) by mouth 2 (two) times daily. Patient not taking: Reported on 09/19/2021 12/19/19   [name], DO  baclofen (LIORESAL) 10 MG tablet Take 1 tablet by mouth every 6 (six) hours as needed for pain. 12/07/19   [provider]  diclofenac Sodium (VOLTAREN) 1 % GEL Apply 1 application topically 4 (four) times daily as needed for pain.  09/12/19   [provider]  DULoxetine (CYMBALTA) 60 MG capsule Take 60 mg by mouth daily. 12/11/19   [provider]  fentaNYL (DURAGESIC) 50 MCG/HR 1 patch every 3 (three) days. 09/15/21   [provider]  gabapentin (NEURONTIN) 300 MG capsule Take 300 mg by mouth 4 (four) times daily.  08/24/16   [provider]  HYDROcodone-acetaminophen (NORCO) 10-325 MG tablet Take 1 tablet by mouth 2 (two) times daily as needed for moderate pain.  08/01/16   [provider]  Multiple Vitamin (MULTIVITAMIN ADULT PO) Take 1 tablet by mouth daily.    [provider]  POTASSIUM PO Take 1 tablet by mouth daily.    [provider]  Turmeric (QC TUMERIC COMPLEX PO) Take 1 tablet by mouth daily.    [provider]      Allergies    Patient has no known allergies.    Review of Systems   Review of Systems  Constitutional:  Positive for chills. Negative for fever.  Respiratory:  Positive for cough, chest tightness and shortness of breath.   Cardiovascular:  Negative for palpitations.  Musculoskeletal:  Positive for myalgias.    Physical Exam Updated Vital Signs BP (!) 124/58 (BP Location: Right Arm)   Pulse 90   Temp 98.5 F (36.9 C) (Oral)   Ht '5\' 2"'$  (1.575 m)   Wt 52.6 kg   SpO2 99%   BMI 21.22  kg/m  Physical Exam Vitals and nursing note reviewed.  Constitutional:      Appearance: Normal appearance.  HENT:     Head: Normocephalic and atraumatic.  Eyes:     General: No scleral icterus.    Conjunctiva/sclera: Conjunctivae normal.  Pulmonary:     Effort: Pulmonary effort is normal. No respiratory distress.     Breath sounds: Examination of the left-middle field reveals decreased breath sounds. Examination of the right-lower field reveals decreased breath sounds. Examination of the left-lower field reveals decreased breath sounds. Decreased breath sounds present. No wheezing or rhonchi.  Skin:    Findings: No rash.   Neurological:     Mental Status: She is alert.  Psychiatric:        Mood and Affect: Mood normal.     ED Results / Procedures / Treatments   Labs (all labs ordered are listed, but only abnormal results are displayed) Labs Reviewed  CBC WITH DIFFERENTIAL/PLATELET - Abnormal; Notable for the following components:      Result Value   Hemoglobin 11.6 (*)    RDW 15.9 (*)    All other components within normal limits  BASIC METABOLIC PANEL - Abnormal; Notable for the following components:   Glucose, Bld 109 (*)    All other components within normal limits  SARS CORONAVIRUS 2 BY RT PCR    EKG None  Radiology DG Chest 2 View  Result Date: 03/10/2022 CLINICAL DATA:  Painful inspiration x3 days. EXAM: CHEST - 2 VIEW COMPARISON:  August 19, 2021 FINDINGS: The heart size and mediastinal contours are within normal limits. Mild to moderate severity infiltrates are seen within the mid and lower lung fields, bilaterally. Small bilateral pleural effusions are noted. No pneumothorax is identified. Radiopaque surgical clips are noted within the right upper quadrant. Postoperative changes are seen within the cervical and thoracic spine. IMPRESSION: 1. Mild to moderate severity bilateral infiltrates. 2. Small bilateral pleural effusions. Electronically Signed   By: Virgina Norfolk M.D.   On: 03/10/2022 19:22    Procedures Procedures   Medications Ordered in ED Medications - No data to display  ED Course/ Medical Decision Making/ A&P                           Medical Decision Making Amount and/or Complexity of Data Reviewed Labs: ordered. Radiology: ordered.  Risk Decision regarding hospitalization.   This patient presents to the ED for concern of pneumonia diagnosed in urgent care.  She was hypoxic so she was sent here.   This is not an exhaustive differential.    Past Medical History / Co-morbidities / Social History: Tobacco use disorder   Additional history: Reviewed  patient's external urgent care notes that she brought with her.  There x-ray appears to be similar to ours today.  Her vital signs were noted to be in oxygen saturation on room air of 83%.  No history of this.  She was given a nebulizer treatment, 1 g of Rocephin, 12 mg of Decadron and sent to the emergency department.   Physical Exam: Pertinent physical exam findings include decreased breath sounds in the lower lung fields and left middle lung field  Lab Tests: I ordered, and personally interpreted labs.  The pertinent results include:  -Negative COVID -Normal white blood cell count   Imaging Studies: I ordered and independently visualized and interpreted imaging which showed bilateral infiltrates and small bilateral effusions. I agree with the radiologist interpretation.  Cardiac Monitoring:  The patient was maintained on a cardiac monitor.  Remained in normal sinus   Medications: Reports having a DuoNeb just prior to arrival which helped her.  Not wheezing on exam, no medications ordered at this time.   Disposition: 61 year old female presented today from urgent care due to hypoxia in the setting of bilateral pneumonia.  Today our x-ray also shows the infiltrates and her oxygen saturations are mid 80s to low 90s on room air.  Mid 90s on 2 L.  Does not wear oxygen at home.  Nursing staff is going to ambulate the patient however she dropped to 85% prior to ambulation so at this point I believe she needs to be admitted for hypoxia in the setting of pneumonia.  No antibiotics have been started being that patient was given antibiotics and steroids at urgent care.  I will discuss antibiotics and admission with hospitalist.    Final Clinical Impression(s) / ED Diagnoses Final diagnoses:  Hypoxia  Community acquired pneumonia, unspecified laterality    Rx / DC Orders ED Discharge Orders     None      Admit to Dr. Hal Hope.   Darliss Ridgel 03/10/22 2103    Godfrey Pick, MD 03/11/22 0040

## 2022-03-10 NOTE — ED Notes (Signed)
Pt taken off O2 and sat up at bedside. Pt O2 saturation without ambulation was 85%. Pt tachypneic at 24 breaths/min.

## 2022-03-11 ENCOUNTER — Observation Stay (HOSPITAL_COMMUNITY): Payer: PPO

## 2022-03-11 DIAGNOSIS — J9601 Acute respiratory failure with hypoxia: Secondary | ICD-10-CM

## 2022-03-11 DIAGNOSIS — Z79899 Other long term (current) drug therapy: Secondary | ICD-10-CM | POA: Diagnosis not present

## 2022-03-11 DIAGNOSIS — F17200 Nicotine dependence, unspecified, uncomplicated: Secondary | ICD-10-CM | POA: Diagnosis not present

## 2022-03-11 DIAGNOSIS — M797 Fibromyalgia: Secondary | ICD-10-CM | POA: Diagnosis present

## 2022-03-11 DIAGNOSIS — D649 Anemia, unspecified: Secondary | ICD-10-CM | POA: Diagnosis present

## 2022-03-11 DIAGNOSIS — M1990 Unspecified osteoarthritis, unspecified site: Secondary | ICD-10-CM | POA: Diagnosis present

## 2022-03-11 DIAGNOSIS — Z20822 Contact with and (suspected) exposure to covid-19: Secondary | ICD-10-CM | POA: Diagnosis present

## 2022-03-11 DIAGNOSIS — J189 Pneumonia, unspecified organism: Secondary | ICD-10-CM | POA: Diagnosis present

## 2022-03-11 DIAGNOSIS — R0902 Hypoxemia: Secondary | ICD-10-CM

## 2022-03-11 DIAGNOSIS — Z7983 Long term (current) use of bisphosphonates: Secondary | ICD-10-CM | POA: Diagnosis not present

## 2022-03-11 DIAGNOSIS — M8580 Other specified disorders of bone density and structure, unspecified site: Secondary | ICD-10-CM | POA: Diagnosis present

## 2022-03-11 DIAGNOSIS — Z85828 Personal history of other malignant neoplasm of skin: Secondary | ICD-10-CM | POA: Diagnosis not present

## 2022-03-11 DIAGNOSIS — Z7982 Long term (current) use of aspirin: Secondary | ICD-10-CM | POA: Diagnosis not present

## 2022-03-11 DIAGNOSIS — F17210 Nicotine dependence, cigarettes, uncomplicated: Secondary | ICD-10-CM | POA: Diagnosis present

## 2022-03-11 LAB — CREATININE, SERUM
Creatinine, Ser: 0.67 mg/dL (ref 0.44–1.00)
GFR, Estimated: >60 mL/min (ref >60)

## 2022-03-11 LAB — EXPECTORATED SPUTUM ASSESSMENT W GRAM STAIN, RFLX TO RESP C

## 2022-03-11 LAB — BASIC METABOLIC PANEL
Anion gap: 10 (ref 5–15)
BUN: 14 mg/dL (ref 6–20)
CO2: 25 mmol/L (ref 22–32)
Calcium: 8.5 mg/dL — ABNORMAL LOW (ref 8.9–10.3)
Chloride: 102 mmol/L (ref 98–111)
Creatinine, Ser: 0.53 mg/dL (ref 0.44–1.00)
GFR, Estimated: >60 mL/min (ref >60)
Glucose, Bld: 139 mg/dL — ABNORMAL HIGH (ref 70–99)
Potassium: 4.7 mmol/L (ref 3.5–5.1)
Sodium: 137 mmol/L (ref 135–145)

## 2022-03-11 LAB — STREP PNEUMONIAE URINARY ANTIGEN: Strep Pneumo Urinary Antigen: NEGATIVE

## 2022-03-11 LAB — CBC
HCT: 36 % (ref 36.0–46.0)
Hemoglobin: 10.9 g/dL — ABNORMAL LOW (ref 12.0–15.0)
MCH: 28.1 pg (ref 26.0–34.0)
MCHC: 30.3 g/dL (ref 30.0–36.0)
MCV: 92.8 fL (ref 80.0–100.0)
Platelets: 313 10*3/uL (ref 150–400)
RBC: 3.88 MIL/uL (ref 3.87–5.11)
RDW: 16.1 % — ABNORMAL HIGH (ref 11.5–15.5)
WBC: 5.8 10*3/uL (ref 4.0–10.5)
nRBC: 0 % (ref 0.0–0.2)

## 2022-03-11 LAB — HIV ANTIBODY (ROUTINE TESTING W REFLEX): HIV Screen 4th Generation wRfx: NONREACTIVE

## 2022-03-11 LAB — TROPONIN I (HIGH SENSITIVITY)
Troponin I (High Sensitivity): 3 ng/L (ref <18)
Troponin I (High Sensitivity): 5 ng/L (ref <18)

## 2022-03-11 MED ORDER — IOHEXOL 350 MG/ML SOLN
60.0000 mL | Freq: Once | INTRAVENOUS | Status: AC | PRN
  Administered 2022-03-11: 60 mL via INTRAVENOUS

## 2022-03-11 MED ORDER — SODIUM CHLORIDE 0.9 % IV SOLN
1.0000 g | INTRAVENOUS | Status: AC
  Administered 2022-03-11: 1 g via INTRAVENOUS
  Filled 2022-03-11: qty 10

## 2022-03-11 NOTE — Progress Notes (Signed)
[facility]                                                                              Sheryl Campbell, is a 61 y.o. female, DOB - 1961/05/18, MAU:[id number] Admit date - 03/10/2022    Outpatient Primary MD for the patient is Arelia Sneddon, Curt Jews, MD  LOS - 0  days  Chief Complaint  Patient presents with   Shortness of Breath       Brief summary   Briefly patient is a 61 year old female with tobacco abuse scented to ED with shortness of breath and cough.  Patient reported that she has been having cough over the last couple of days has been worsening with generalized body ache.  Had gone to the urgent care where chest x-ray showed pneumonia and patient was easily getting hypoxic was referred to the ER.  Also reported left-sided pleuritic chest pain. In the ER patient was hypoxic requiring 2 L oxygen.  Chest x-ray confirmed multifocal pneumonia.  COVID-19 test negative    Assessment & Plan    Principal Problem:   Acute respiratory failure with hypoxia (HCC) requiring 2 L O2, new secondary to multifocal pneumonia, CAP  -Follow urine Legionella antigen, CTA negative for pulmonary embolism, confirmed multifocal pneumonia -Urine strep antigen negative -Continue IV Zithromax, Rocephin -Wean O2 as tolerated   Active Problems:   CAP (community acquired pneumonia) -As #1    Nicotine dependence -Counseled strongly on quitting smoking -Placed on nicotine patch    Normocytic anemia -Appears chronic, follow CBC, will need outpatient work-up.  Baseline hemoglobin 11.0 -No history of any bleeding   Code Status: full code  DVT Prophylaxis:  enoxaparin (LOVENOX) injection 40 mg Start: 03/10/22 2345   Level of Care: Level of care: Telemetry Medical Family Communication: Updated patient   Disposition Plan:      Remains inpatient appropriate: Hopefully DC home in next 24 to 48 hours pending improvement.  PT evaluation ordered   Procedures:  CTA  chest  Consultants:   None  Antimicrobials:   Anti-infectives (From admission, onward)    Start     Dose/Rate Route Frequency Ordered Stop   03/11/22 0030  cefTRIAXone (ROCEPHIN) 1 g in sodium chloride 0.9 % 100 mL IVPB        1 g 200 mL/hr over 30 Minutes Intravenous NOW 03/11/22 0024 03/11/22 0148   03/10/22 2345  cefTRIAXone (ROCEPHIN) 2 g in sodium chloride 0.9 % 100 mL IVPB        2 g 200 mL/hr over 30 Minutes Intravenous Daily at bedtime 03/10/22 2329 03/15/22 2159   03/10/22 2345  azithromycin (ZITHROMAX) 500 mg in sodium chloride 0.9 % 250 mL IVPB        500 mg 250 mL/hr over 60 Minutes Intravenous Daily at bedtime 03/10/22 2329 03/15/22 2159          Medications  enoxaparin (LOVENOX) injection  40 mg Subcutaneous QHS      Subjective:   Sheryl Campbell was seen and examined today.  Still on O2, no acute chest pain or shortness of breath.  Still feels weak.  Patient denies dizziness, chest pain,  shortness of breath, abdominal pain, N/V/D/C, new weakness.  Objective:   Vitals:   03/11/22 0345 03/11/22 0415 03/11/22 0500 03/11/22 0600  BP: 136/71 (!) 142/75 136/72 (!) 150/81  Pulse: 68 80 69 67  Resp: '18 18 14 19  '$ Temp:      TempSrc:      SpO2: 96% 93% 94% 96%  Weight:      Height:       No intake or output data in the 24 hours ending 03/11/22 0814   Wt Readings from Last 3 Encounters:  03/10/22 52.6 kg  09/19/21 54.6 kg  12/18/19 52.3 kg     Exam General: Alert and oriented x 3, NAD Cardiovascular: S1 S2 auscultated,  RRR Respiratory: diminished BS at bases, no wheezing, Gastrointestinal: Soft, nontender, nondistended, + bowel sounds Ext: no pedal edema bilaterally Neuro: Strength 5/5 upper and lower extremities bilaterally Psych: Normal affect and demeanor, alert and oriented x3     Data Reviewed:  I have personally reviewed following labs    CBC Lab Results  Component Value Date   WBC 5.8 03/11/2022   RBC 3.88 03/11/2022   HGB 10.9  (L) 03/11/2022   HCT 36.0 03/11/2022   MCV 92.8 03/11/2022   MCH 28.1 03/11/2022   PLT 313 03/11/2022   MCHC 30.3 03/11/2022   RDW 16.1 (H) 03/11/2022   LYMPHSABS 0.8 03/10/2022   MONOABS 0.3 03/10/2022   EOSABS 0.1 03/10/2022   BASOSABS 0.0 95/18/8416     Last metabolic panel Lab Results  Component Value Date   NA 137 03/11/2022   K 4.7 03/11/2022   CL 102 03/11/2022   CO2 25 03/11/2022   BUN 14 03/11/2022   CREATININE 0.53 03/11/2022   GLUCOSE 139 (H) 03/11/2022   GFRNONAA >60 03/11/2022   GFRAA >60 12/19/2019   CALCIUM 8.5 (L) 03/11/2022   PROT 7.7 09/19/2021   ALBUMIN 4.0 09/19/2021   LABGLOB 3.7 09/19/2021   BILITOT 0.2 (L) 09/19/2021   ALKPHOS 156 (H) 09/19/2021   AST 15 09/19/2021   ALT 11 09/19/2021   ANIONGAP 10 03/11/2022      Radiology Studies: I have personally reviewed the imaging studies  CT Angio Chest Pulmonary Embolism (PE) W or WO Contrast  Result Date: 03/11/2022 CLINICAL DATA:  Concern for pulmonary embolism.  IMPRESSION: 1. No CT evidence of pulmonary artery embolus. 2. Findings most consistent with multilobar pneumonia and less likely edema. 3. Trace bilateral pleural effusions. 4. Mildly enlarged bilateral hilar lymph nodes, likely reactive. Electronically Signed   By: Anner Crete M.D.   On: 03/11/2022 00:54   DG Chest 2 View  Result Date: 03/10/2022 CLINICAL DATA:  Painful inspiration x3 days. IMPRESSION: 1. Mild to moderate severity bilateral infiltrates. 2. Small bilateral pleural effusions. Electronically Signed   By: Virgina Norfolk M.D.   On: 03/10/2022 19:22       Wyn Nettle M.D. [facility] 03/11/2022, 8:14 AM  Available via Epic secure chat 7am-7pm After 7 pm, please refer to night coverage provider listed on amion.

## 2022-03-11 NOTE — ED Notes (Signed)
Patient transported to CT 

## 2022-03-11 NOTE — ED Notes (Signed)
Breakfast order placed ?

## 2022-03-11 NOTE — ED Notes (Signed)
ED TO INPATIENT HANDOFF REPORT   S Name/Age/Gender Sheryl Campbell 61 y.o. female Room/Bed: 046C/046C  Code Status   Code Status: Full Code  Home/SNF/Other Home Patient oriented to: self, place, time, and situation Is this baseline? Yes   Triage Complete: Triage complete  Chief Complaint CAP (community acquired pneumonia) [J18.9]  Triage Note Pt arrived with EMS from [facility]. Went there for coughing and sob for a few weeks. They did chest xray and showed PNA. Given 1 gm rocephin, 12 mg decadron, 1 duoneb. Called ems because 02 dropped to 80s.   Allergies No Known Allergies  Level of Care/Admitting Diagnosis ED Disposition     ED Disposition  Admit   Condition  --   Comment  Hospital Area: [facility] [[id number]]  Level of Care: Telemetry Medical [104]  May admit patient to [facility] or [facility] if equivalent level of care is available:: No  Covid Evaluation: Confirmed COVID Negative  Diagnosis: CAP (community acquired pneumonia) [614431]  Admitting Physician: RAI, Vernelle Emerald [[id number]]  Attending Physician: RAI, RIPUDEEP K [[id number]]  Certification:: I certify this patient will need inpatient services for at least 2 midnights  Estimated Length of Stay: 2          B Medical/Surgery History Past Medical History:  Diagnosis Date   Arthritis    Bell's palsy    Cancer ([place])    skin (not melanoma)   Fibromyalgia    Intervertebral disk disease    "my whole spine"   Osteopenia    Sciatica    Scoliosis    Past Surgical History:  Procedure Laterality Date   BACK SURGERY  05/2004   Harrington rods T3- T11   BUNIONECTOMY Bilateral 07/2001   CHOLECYSTECTOMY  02/2000   NECK SURGERY  10/2002   cervical diskectomy, C5-C7   NOSE SURGERY  07/1993   deviated septum   ORIF ANKLE FRACTURE Right 12/18/2019   Procedure: OPEN REDUCTION INTERNAL FIXATION RIGHT ANKLE FRACTURE;  Surgeon: Meredith Pel, MD;  Location: [facility];  Service:  Orthopedics;  Laterality: Right;     A IV Location/Drains/Wounds Patient Lines/Drains/Airways Status     Active Line/Drains/Airways     Name Placement date Placement time Site Days   Peripheral IV 03/10/22 20 G 0.75" Anterior;Right Forearm 03/10/22  1730  Forearm  1   External Urinary Catheter 12/17/19  2153  --  815   Incision (Closed) 12/18/19 Ankle Right 12/18/19  1002  -- 814            Intake/Output Last 24 hours No intake or output data in the 24 hours ending 03/11/22 1334  Labs/Imaging Results for orders placed or performed during the hospital encounter of 03/10/22 (from the past 48 hour(s))  SARS Coronavirus 2 by RT PCR (hospital order, performed in [facility] hospital lab) *cepheid single result test* Anterior Nasal Swab     Status: None   Collection Time: 03/10/22  6:10 PM   Specimen: Anterior Nasal Swab  Result Value Ref Range   SARS Coronavirus 2 by RT PCR NEGATIVE NEGATIVE    Comment: (NOTE) SARS-CoV-2 target nucleic acids are NOT DETECTED.  The SARS-CoV-2 RNA is generally detectable in upper and lower respiratory specimens during the acute phase of infection. The lowest concentration of SARS-CoV-2 viral copies this assay can detect is 250 copies / mL. A negative result does not preclude SARS-CoV-2 infection and should not be used as the sole basis for treatment or other  patient management decisions.  A negative result may occur with improper specimen collection / handling, submission of specimen other than nasopharyngeal swab, presence of viral mutation(s) within the areas targeted by this assay, and inadequate number of viral copies (<250 copies / mL). A negative result must be combined with clinical observations, patient history, and epidemiological information.  Fact Sheet for Patients:   https://www.patel.info/  Fact Sheet for Healthcare Providers: https://hall.com/  This test is not yet approved or   cleared by the [place] FDA and has been authorized for detection and/or diagnosis of SARS-CoV-2 by FDA under an Emergency Use Authorization (EUA).  This EUA will remain in effect (meaning this test can be used) for the duration of the COVID-19 declaration under Section 564(b)(1) of the Act, 21 U.S.C. section 360bbb-3(b)(1), unless the authorization is terminated or revoked sooner.  Performed at [facility], [place] [address] [place], [place] [address]   CBC with Differential     Status: Abnormal   Collection Time: 03/10/22  6:36 PM  Result Value Ref Range   WBC 7.9 4.0 - 10.5 K/uL   RBC 4.06 3.87 - 5.11 MIL/uL   Hemoglobin 11.6 (L) 12.0 - 15.0 g/dL   HCT 36.7 36.0 - 46.0 %   MCV 90.4 80.0 - 100.0 fL   MCH 28.6 26.0 - 34.0 pg   MCHC 31.6 30.0 - 36.0 g/dL   RDW 15.9 (H) 11.5 - 15.5 %   Platelets 305 150 - 400 K/uL   nRBC 0.0 0.0 - 0.2 %   Neutrophils Relative % 86 %   Neutro Abs 6.8 1.7 - 7.7 K/uL   Lymphocytes Relative 10 %   Lymphs Abs 0.8 0.7 - 4.0 K/uL   Monocytes Relative 3 %   Monocytes Absolute 0.3 0.1 - 1.0 K/uL   Eosinophils Relative 1 %   Eosinophils Absolute 0.1 0.0 - 0.5 K/uL   Basophils Relative 0 %   Basophils Absolute 0.0 0.0 - 0.1 K/uL   Immature Granulocytes 0 %   Abs Immature Granulocytes 0.03 0.00 - 0.07 K/uL    Comment: Performed at [facility] [address] [place], [place] [address]  Basic metabolic panel     Status: Abnormal   Collection Time: 03/10/22  6:36 PM  Result Value Ref Range   Sodium 138 135 - 145 mmol/L   Potassium 3.5 3.5 - 5.1 mmol/L   Chloride 98 98 - 111 mmol/L   CO2 28 22 - 32 mmol/L   Glucose, Bld 109 (H) 70 - 99 mg/dL    Comment: Glucose reference range applies only to samples taken after fasting for at least 8 hours.   BUN 8 6 - 20 mg/dL   Creatinine, Ser 0.50 0.44 - 1.00 mg/dL   Calcium 9.2 8.9 - 10.3 mg/dL   GFR, Estimated >60 >60 mL/min    Comment: (NOTE) Calculated using the CKD-EPI Creatinine Equation  (2021)    Anion gap 12 5 - 15    Comment: Performed at [facility] [address] [place], [place] [address]  HIV Antibody (routine testing w rflx)     Status: None   Collection Time: 03/11/22  1:24 AM  Result Value Ref Range   HIV Screen 4th Generation wRfx Non Reactive Non Reactive    Comment: Performed at [facility], [place] [address], [place], [place] [address]  Strep pneumoniae urinary antigen     Status: None   Collection Time: 03/11/22  1:24  AM  Result Value Ref Range   Strep Pneumo Urinary Antigen NEGATIVE NEGATIVE    Comment:        Infection due to S. pneumoniae cannot be absolutely ruled out since the antigen present may be below the detection limit of the test. Performed at [facility], [address] [place], [place] [address]   CBC     Status: Abnormal   Collection Time: 03/11/22  1:24 AM  Result Value Ref Range   WBC 5.8 4.0 - 10.5 K/uL   RBC 3.88 3.87 - 5.11 MIL/uL   Hemoglobin 10.9 (L) 12.0 - 15.0 g/dL   HCT 36.0 36.0 - 46.0 %   MCV 92.8 80.0 - 100.0 fL   MCH 28.1 26.0 - 34.0 pg   MCHC 30.3 30.0 - 36.0 g/dL   RDW 16.1 (H) 11.5 - 15.5 %   Platelets 313 150 - 400 K/uL   nRBC 0.0 0.0 - 0.2 %    Comment: Performed at [facility], [place] [address], [place], [place] [address]  Creatinine, serum     Status: None   Collection Time: 03/11/22  1:24 AM  Result Value Ref Range   Creatinine, Ser 0.67 0.44 - 1.00 mg/dL   GFR, Estimated >60 >60 mL/min    Comment: (NOTE) Calculated using the CKD-EPI Creatinine Equation (2021) Performed at [facility] [address], [place],  [address]   Basic metabolic panel     Status: Abnormal   Collection Time: 03/11/22  4:45 AM  Result Value Ref Range   Sodium 137 135 - 145 mmol/L   Potassium 4.7 3.5 - 5.1 mmol/L    Comment: DELTA CHECK NOTED   Chloride 102 98 - 111 mmol/L   CO2 25 22 - 32 mmol/L   Glucose, Bld 139 (H) 70 - 99 mg/dL    Comment: Glucose reference range applies only  to samples taken after fasting for at least 8 hours.   BUN 14 6 - 20 mg/dL   Creatinine, Ser 0.53 0.44 - 1.00 mg/dL   Calcium 8.5 (L) 8.9 - 10.3 mg/dL   GFR, Estimated >60 >60 mL/min    Comment: (NOTE) Calculated using the CKD-EPI Creatinine Equation (2021)    Anion gap 10 5 - 15    Comment: Performed at [facility] [address] [place], [place] [address]  Troponin I (High Sensitivity)     Status: None   Collection Time: 03/11/22  4:45 AM  Result Value Ref Range   Troponin I (High Sensitivity) 3 <18 ng/L    Comment: (NOTE) Elevated high sensitivity troponin I (hsTnI) values and significant  changes across serial measurements may suggest ACS but many other  chronic and acute conditions are known to elevate hsTnI results.  Refer to the "Links" section for chest pain algorithms and additional  guidance. Performed at [facility], [place] [address] [place], [place] [address]   Troponin I (High Sensitivity)     Status: None   Collection Time: 03/11/22  6:25 AM  Result Value Ref Range   Troponin I (High Sensitivity) 5 <18 ng/L    Comment: (NOTE) Elevated high sensitivity troponin I (hsTnI) values and significant  changes across serial measurements may suggest ACS but many other  chronic and acute conditions are known to elevate hsTnI results.  Refer to the "Links" section for chest pain algorithms and additional  guidance. Performed at [facility], [place],  [place] [address]    CT Angio Chest Pulmonary Embolism (PE) W or WO Contrast  Result Date: 03/11/2022 CLINICAL DATA:  Concern for pulmonary embolism. EXAM: CT ANGIOGRAPHY CHEST WITH CONTRAST TECHNIQUE: Multidetector CT imaging of the chest was performed using the standard protocol during bolus administration of intravenous contrast. Multiplanar CT image reconstructions and MIPs were obtained to evaluate the vascular anatomy. RADIATION DOSE REDUCTION: This exam was performed according to the  departmental dose-optimization program which includes automated exposure control, adjustment of the mA and/or kV according to patient size and/or use of iterative reconstruction technique. CONTRAST:  10m OMNIPAQUE IOHEXOL 350 MG/ML SOLN COMPARISON:  Chest CT dated 08/21/2021. FINDINGS: Evaluation is limited due to streak artifact caused by patient's arms and spinal hardware. Cardiovascular: Top-normal cardiac size. No pericardial effusion. The thoracic aorta is unremarkable. The origins of the great vessels of the aortic arch appear patent. No pulmonary artery embolus identified. Mediastinum/Nodes: Mildly enlarged bilateral hilar lymph nodes. The esophagus is grossly unremarkable. No mediastinal fluid collection. Lungs/Pleura: Diffuse interstitial prominence and patchy areas of ground-glass opacity throughout the lungs predominantly involving the mid and lower lung fields. Findings most consistent with multilobar pneumonia and less likely edema. Clinical correlation is recommended. Trace bilateral pleural effusions. No pneumothorax. The central airways are patent. Upper Abdomen: No acute abnormality. Musculoskeletal: No acute osseous pathology. Osteopenia with degenerative changes of the spine. Scoliosis and spinal Harrington rods. Review of the MIP images confirms the above findings. IMPRESSION: 1. No CT evidence of pulmonary artery embolus. 2. Findings most consistent with multilobar pneumonia and less likely edema. 3. Trace bilateral pleural effusions. 4. Mildly enlarged bilateral hilar lymph nodes, likely reactive. Electronically Signed   By: AAnner CreteM.D.   On: 03/11/2022 00:54   DG Chest 2 View  Result Date: 03/10/2022 CLINICAL DATA:  Painful inspiration x3 days. EXAM: CHEST - 2 VIEW COMPARISON:  August 19, 2021 FINDINGS: The heart size and mediastinal contours are within normal limits. Mild to moderate severity infiltrates are seen within the mid and lower lung fields, bilaterally. Small  bilateral pleural effusions are noted. No pneumothorax is identified. Radiopaque surgical clips are noted within the right upper quadrant. Postoperative changes are seen within the cervical and thoracic spine. IMPRESSION: 1. Mild to moderate severity bilateral infiltrates. 2. Small bilateral pleural effusions. Electronically Signed   By: TVirgina NorfolkM.D.   On: 03/10/2022 19:22    Pending Labs Unresulted Labs (From admission, onward)     Start     Ordered   03/17/22 0500  Creatinine, serum  (enoxaparin (LOVENOX)    CrCl >/= 30 ml/min)  Weekly,   R     Comments: while on enoxaparin therapy    03/10/22 2329   03/10/22 2329  Expectorated Sputum Assessment w Gram Stain, Rflx to Resp Cult  Once,   R        03/10/22 2329   03/10/22 2328  Legionella Pneumophila Serogp 1 Ur Ag  Once,   R        03/10/22 2329            Vitals/Pain Today's Vitals   03/11/22 0900 03/11/22 1009 03/11/22 1011 03/11/22 1300  BP: (!) 149/77 (!) 147/81  (!) 153/76  Pulse: 82 76  74  Resp: 19 18  (!) 21  Temp:   98 F (36.7 C)   TempSrc:   Oral   SpO2: 91% 99%  96%  Weight:      Height:      PainSc:  Isolation Precautions No active isolations  Medications Medications  cefTRIAXone (ROCEPHIN) 2 g in sodium chloride 0.9 % 100 mL IVPB (2 g Intravenous Not Given 03/11/22 0025)  azithromycin (ZITHROMAX) 500 mg in sodium chloride 0.9 % 250 mL IVPB (0 mg Intravenous Stopped 03/11/22 0250)  enoxaparin (LOVENOX) injection 40 mg (40 mg Subcutaneous Given 03/11/22 0115)  albuterol (PROVENTIL) (2.5 MG/3ML) 0.083% nebulizer solution 2.5 mg (has no administration in time range)  cefTRIAXone (ROCEPHIN) 1 g in sodium chloride 0.9 % 100 mL IVPB (0 g Intravenous Stopped 03/11/22 0148)  iohexol (OMNIPAQUE) 350 MG/ML injection 60 mL (60 mLs Intravenous Contrast Given 03/11/22 0042)    Mobility walks Low fall risk   Focused Assessments Pulmonary Assessment Handoff:  Lung sounds:   O2 Device: Nasal  Cannula O2 Flow Rate (L/min): 2 L/min    R Recommendations: See Admitting Provider Note

## 2022-03-11 NOTE — Evaluation (Signed)
Physical Therapy Evaluation Patient Details Name: Sheryl Campbell MRN: [id number] DOB: 04/02/1961 Today's Date: 03/11/2022  History of Present Illness  Pt is a 61 y/o female admitted secondary to respiratory failure from PNA. PMH includes tobacco use, fibromyalgia and skin cancer.  Clinical Impression  Pt admitted secondary to problem above with deficits below. Mobility limited to within ED room as oxygen needed to be monitored and no portable monitor available. Overall, tolerated mobility fairly well. Oxygen at 90% on RA throughout mobility. Brief drop to 88% on RA upon return to supine, but quickly rose back to 90% on RA. Anticipate pt will progress well and will not require follow up PT. Will continue to follow acutely.        Recommendations for follow up therapy are one component of a multi-disciplinary discharge planning process, led by the attending physician.  Recommendations may be updated based on patient status, additional functional criteria and insurance authorization.  Follow Up Recommendations No PT follow up      Assistance Recommended at Discharge Intermittent Supervision/Assistance  Patient can return home with the following  Assistance with cooking/housework    Equipment Recommendations None recommended by PT  Recommendations for Other Services       Functional Status Assessment Patient has had a recent decline in their functional status and demonstrates the ability to make significant improvements in function in a reasonable and predictable amount of time.     Precautions / Restrictions Precautions Precautions: None Restrictions Weight Bearing Restrictions: No      Mobility  Bed Mobility Overal bed mobility: Independent                  Transfers Overall transfer level: Needs assistance Equipment used: None Transfers: Sit to/from Stand Sit to Stand: Supervision           General transfer comment: supervision for safety and line  management.    Ambulation/Gait Ambulation/Gait assistance: Min guard, Supervision Gait Distance (Feet): 15 Feet Assistive device: None Gait Pattern/deviations: Step-through pattern, Decreased stride length Gait velocity: Decreased     General Gait Details: No overt LOB noted. Oxygen at 90% on RA throughout mobility. Brief drop to 88% on RA upon return to supine, but quickly rose back to 90% on RA.  Stairs            Wheelchair Mobility    Modified Rankin (Stroke Patients Only)       Balance Overall balance assessment: No apparent balance deficits (not formally assessed)                                           Pertinent Vitals/Pain Pain Assessment Pain Assessment: No/denies pain    Home Living Family/patient expects to be discharged to:: Private residence Living Arrangements: Spouse/significant other Available Help at Discharge: Family Type of Home: House Home Access: Stairs to enter Entrance Stairs-Rails:  (has a wall) Entrance Stairs-Number of Steps: 2   Home Layout: One level Home Equipment: None      Prior Function Prior Level of Function : Independent/Modified Independent                     Hand Dominance        Extremity/Trunk Assessment   Upper Extremity Assessment Upper Extremity Assessment: Overall WFL for tasks assessed    Lower Extremity Assessment Lower Extremity Assessment: Generalized weakness  Cervical / Trunk Assessment Cervical / Trunk Assessment: Normal  Communication   Communication: No difficulties  Cognition Arousal/Alertness: Awake/alert Behavior During Therapy: WFL for tasks assessed/performed Overall Cognitive Status: Within Functional Limits for tasks assessed                                          General Comments      Exercises     Assessment/Plan    PT Assessment Patient needs continued PT services  PT Problem List Cardiopulmonary status limiting  activity;Decreased activity tolerance;Decreased mobility       PT Treatment Interventions Gait training;Stair training;Functional mobility training    PT Goals (Current goals can be found in the Care Plan section)  Acute Rehab PT Goals Patient Stated Goal: to go home PT Goal Formulation: With patient Time For Goal Achievement: 03/25/22 Potential to Achieve Goals: Good    Frequency Min 3X/week     Co-evaluation               AM-PAC PT "6 Clicks" Mobility  Outcome Measure Help needed turning from your back to your side while in a flat bed without using bedrails?: None Help needed moving from lying on your back to sitting on the side of a flat bed without using bedrails?: None Help needed moving to and from a bed to a chair (including a wheelchair)?: A Little Help needed standing up from a chair using your arms (e.g., wheelchair or bedside chair)?: A Little Help needed to walk in hospital room?: A Little Help needed climbing 3-5 steps with a railing? : A Little 6 Click Score: 20    End of Session   Activity Tolerance: Patient limited by fatigue Patient left: in bed;with call bell/phone within reach (on stretcher in ED) Nurse Communication: Mobility status PT Visit Diagnosis: Other abnormalities of gait and mobility (R26.89)    Time: 8916-9450 PT Time Calculation (min) (ACUTE ONLY): 10 min   Charges:   PT Evaluation $PT Eval Low Complexity: 1 Low          Lou Miner, DPT  Acute Rehabilitation Services  Office: [phone]   Rudean Hitt 03/11/2022, 1:54 PM

## 2022-03-12 DIAGNOSIS — J9601 Acute respiratory failure with hypoxia: Secondary | ICD-10-CM | POA: Diagnosis not present

## 2022-03-12 DIAGNOSIS — J189 Pneumonia, unspecified organism: Secondary | ICD-10-CM | POA: Diagnosis not present

## 2022-03-12 DIAGNOSIS — R0902 Hypoxemia: Secondary | ICD-10-CM | POA: Diagnosis not present

## 2022-03-12 LAB — LEGIONELLA PNEUMOPHILA SEROGP 1 UR AG: L. pneumophila Serogp 1 Ur Ag: NEGATIVE

## 2022-03-12 MED ORDER — ARIPIPRAZOLE 2 MG PO TABS
2.0000 mg | ORAL_TABLET | Freq: Every day | ORAL | Status: DC
  Administered 2022-03-12 – 2022-03-13 (×2): 2 mg via ORAL
  Filled 2022-03-12 (×2): qty 1

## 2022-03-12 MED ORDER — BACLOFEN 10 MG PO TABS
10.0000 mg | ORAL_TABLET | Freq: Four times a day (QID) | ORAL | Status: DC | PRN
Start: 2022-03-12 — End: 2022-03-13

## 2022-03-12 MED ORDER — ACETAMINOPHEN 325 MG PO TABS: 650.0000 mg | ORAL_TABLET | Freq: Four times a day (QID) | ORAL | Status: DC | PRN

## 2022-03-12 MED ORDER — AZITHROMYCIN 500 MG PO TABS
500.0000 mg | ORAL_TABLET | Freq: Every day | ORAL | Status: DC
  Administered 2022-03-12: 500 mg via ORAL
  Filled 2022-03-12: qty 1

## 2022-03-12 MED ORDER — GABAPENTIN 300 MG PO CAPS
300.0000 mg | ORAL_CAPSULE | Freq: Three times a day (TID) | ORAL | Status: DC
Start: 2022-03-12 — End: 2022-03-13
  Administered 2022-03-12 – 2022-03-13 (×5): 300 mg via ORAL
  Filled 2022-03-12 (×5): qty 1

## 2022-03-12 MED ORDER — DULOXETINE HCL 60 MG PO CPEP
60.0000 mg | ORAL_CAPSULE | Freq: Every day | ORAL | Status: DC
  Administered 2022-03-12 – 2022-03-13 (×2): 60 mg via ORAL
  Filled 2022-03-12 (×2): qty 1

## 2022-03-12 NOTE — Progress Notes (Signed)
[facility]                                                                              Sheryl Campbell, is a 61 y.o. female, DOB - 04/24/1961, TOI:[id number] Admit date - 03/10/2022    Outpatient Primary MD for the patient is Leonard Downing, MD  LOS - 1  days  Chief Complaint  Patient presents with   Shortness of Breath       Brief summary   Briefly patient is a 61 year old female with tobacco abuse scented to ED with shortness of breath and cough.  Patient reported that she has been having cough over the last couple of days has been worsening with generalized body ache.  Had gone to the urgent care where chest x-ray showed pneumonia and patient was easily getting hypoxic was referred to the ER.  Also reported left-sided pleuritic chest pain. In the ER patient was hypoxic requiring 2 L oxygen.  Chest x-ray confirmed multifocal pneumonia.  COVID-19 test negative    Assessment & Plan    Principal Problem:   Acute respiratory failure with hypoxia (HCC) requiring 2 L O2, new secondary to multifocal pneumonia, CAP  -CTA negative for pulmonary embolism, showed multifocal pneumonia  -Urine strep antigen negative -Urine Legionella antigen pending, continue IV Zithromax, Rocephin -Wean O2 as tolerated   Active Problems:   CAP (community acquired pneumonia) -As #1    Nicotine dependence -Counseled strongly on quitting smoking -Placed on nicotine patch    Normocytic anemia -Appears chronic, H&H stable, will need outpatient work-up.  Baseline hemoglobin 11.0 -No history of any bleeding  Generalized debility -PT OT evaluation, did fairly well   Code Status: full code  DVT Prophylaxis:  enoxaparin (LOVENOX) injection 40 mg Start: 03/10/22 2345   Level of Care: Level of care: Telemetry Medical Family Communication: Updated patient   Disposition Plan:      Remains inpatient appropriate: Hopefully DC home in a.m. if continues to improve   Procedures:   CTA chest  Consultants:   None  Antimicrobials:   Anti-infectives (From admission, onward)    Start     Dose/Rate Route Frequency Ordered Stop   03/12/22 2200  azithromycin (ZITHROMAX) tablet 500 mg        500 mg Oral Daily at bedtime 03/12/22 0849 03/15/22 2159   03/11/22 0030  cefTRIAXone (ROCEPHIN) 1 g in sodium chloride 0.9 % 100 mL IVPB        1 g 200 mL/hr over 30 Minutes Intravenous NOW 03/11/22 0024 03/11/22 0148   03/10/22 2345  cefTRIAXone (ROCEPHIN) 2 g in sodium chloride 0.9 % 100 mL IVPB        2 g 200 mL/hr over 30 Minutes Intravenous Daily at bedtime 03/10/22 2329 03/15/22 2159   03/10/22 2345  azithromycin (ZITHROMAX) 500 mg in sodium chloride 0.9 % 250 mL IVPB  Status:  Discontinued        500 mg 250 mL/hr over 60 Minutes Intravenous Daily at bedtime 03/10/22 2329 03/12/22 0849          Medications  ARIPiprazole  2 mg Oral Daily   azithromycin  500 mg Oral QHS   DULoxetine  60 mg Oral Daily   enoxaparin (LOVENOX) injection  40 mg Subcutaneous QHS   gabapentin  300 mg Oral TID AC & HS      Subjective:   Sheryl Campbell was seen and examined today.  No acute complaints, still feels weak otherwise feels she is improving.  No fevers or chills. Patient denies dizziness, chest pain, shortness of breath, abdominal pain, N/V/D/C, new weakness.  Objective:   Vitals:   03/11/22 2041 03/11/22 2135 03/12/22 0432 03/12/22 0923  BP: 135/60 (!) 144/75 139/62 (!) 121/53  Pulse: 73 87 68 73  Resp: '18 18 18 18  '$ Temp: 98.3 F (36.8 C) 98.2 F (36.8 C) (!) 97.4 F (36.3 C) 98.3 F (36.8 C)  TempSrc:  Oral Oral   SpO2: 96% 98% 96% 97%  Weight:      Height:        Intake/Output Summary (Last 24 hours) at 03/12/2022 1250 Last data filed at 03/12/2022 0600 Gross per 24 hour  Intake 1077.58 ml  Output --  Net 1077.58 ml     Wt Readings from Last 3 Encounters:  03/10/22 52.6 kg  09/19/21 54.6 kg  12/18/19 52.3 kg   Physical Exam General: Alert and  oriented x 3, NAD Cardiovascular: S1 S2 clear, RRR.  Respiratory: Bilateral scattered rhonchi Gastrointestinal: Soft, nontender, nondistended, NBS Ext: no pedal edema bilaterally Neuro: no new deficits Psych: Normal affect and demeanor, alert and oriented x3    Data Reviewed:  I have personally reviewed following labs    CBC Lab Results  Component Value Date   WBC 5.8 03/11/2022   RBC 3.88 03/11/2022   HGB 10.9 (L) 03/11/2022   HCT 36.0 03/11/2022   MCV 92.8 03/11/2022   MCH 28.1 03/11/2022   PLT 313 03/11/2022   MCHC 30.3 03/11/2022   RDW 16.1 (H) 03/11/2022   LYMPHSABS 0.8 03/10/2022   MONOABS 0.3 03/10/2022   EOSABS 0.1 03/10/2022   BASOSABS 0.0 30/16/0109     Last metabolic panel Lab Results  Component Value Date   NA 137 03/11/2022   K 4.7 03/11/2022   CL 102 03/11/2022   CO2 25 03/11/2022   BUN 14 03/11/2022   CREATININE 0.53 03/11/2022   GLUCOSE 139 (H) 03/11/2022   GFRNONAA >60 03/11/2022   GFRAA >60 12/19/2019   CALCIUM 8.5 (L) 03/11/2022   PROT 7.7 09/19/2021   ALBUMIN 4.0 09/19/2021   LABGLOB 3.7 09/19/2021   BILITOT 0.2 (L) 09/19/2021   ALKPHOS 156 (H) 09/19/2021   AST 15 09/19/2021   ALT 11 09/19/2021   ANIONGAP 10 03/11/2022      Radiology Studies: I have personally reviewed the imaging studies  CT Angio Chest Pulmonary Embolism (PE) W or WO Contrast  Result Date: 03/11/2022 CLINICAL DATA:  Concern for pulmonary embolism.  IMPRESSION: 1. No CT evidence of pulmonary artery embolus. 2. Findings most consistent with multilobar pneumonia and less likely edema. 3. Trace bilateral pleural effusions. 4. Mildly enlarged bilateral hilar lymph nodes, likely reactive. Electronically Signed   By: Anner Crete M.D.   On: 03/11/2022 00:54   DG Chest 2 View  Result Date: 03/10/2022 CLINICAL DATA:  Painful inspiration x3 days. IMPRESSION: 1. Mild to moderate severity bilateral infiltrates. 2. Small bilateral pleural effusions. Electronically Signed    By: Virgina Norfolk M.D.   On: 03/10/2022 19:22       Stehanie Ekstrom M.D. [facility] 03/12/2022, 12:50 PM  Available via Epic secure  chat 7am-7pm After 7 pm, please refer to night coverage provider listed on amion.

## 2022-03-12 NOTE — TOC Initial Note (Signed)
Transition of Care [facility]) - Initial/Assessment Note    Patient Details  Name: Sheryl Campbell MRN: [id number] Date of Birth: May 03, 1961  Transition of [facility]) CM/SW Contact:    Tom-Johnson, Renea Ee, RN Phone Number: 03/12/2022, 10:41 AM  Clinical Narrative:                  CM spoke with patient at bedside about needs for post hospital transition. Admitted for ARF with Hypoxia 2/2 multifocal PNA.  Currently on 2L O2, does not use home O2. On IV abx.  From home with husband, has one supportive step son. Has two siblings and nieces and nephews to assist if needed. Currently not employed, on disability. Does not have DME's at home. Independent with care and drive self prior to admission.  PCP is Leonard Downing, MD and uses [facility].  No PT f/u noted.  CM will continue to follow with needs as patient progresses with care.   Expected Discharge Plan: Home/Self Care Barriers to Discharge: Continued Medical Work up   Patient Goals and CMS Choice Patient states their goals for this hospitalization and ongoing recovery are:: To return home CMS Medicare.gov Compare Post Acute Care list provided to:: Patient Choice offered to / list presented to : NA  Expected Discharge Plan and Services Expected Discharge Plan: Home/Self Care   Discharge Planning Services: CM Consult Post Acute Care Choice: NA Living arrangements for the past 2 months: Single Family Home                 DME Arranged: N/A DME Agency: NA       HH Arranged: NA HH Agency: NA        Prior Living Arrangements/Services Living arrangements for the past 2 months: Single Family Home Lives with:: Spouse Patient language and need for interpreter reviewed:: Yes Do you feel safe going back to the place where you live?: Yes      Need for Family Participation in Patient Care: Yes (Comment) Care giver support system in place?: Yes (comment)   Criminal Activity/Legal Involvement  Pertinent to Current Situation/Hospitalization: No - Comment as needed  Activities of Daily Living Home Assistive Devices/Equipment: None ADL Screening (condition at time of admission) Patient's cognitive ability adequate to safely complete daily activities?: Yes Is the patient deaf or have difficulty hearing?: No Does the patient have difficulty seeing, even when wearing glasses/contacts?: No Does the patient have difficulty concentrating, remembering, or making decisions?: No Patient able to express need for assistance with ADLs?: Yes Does the patient have difficulty dressing or bathing?: No Independently performs ADLs?: Yes (appropriate for developmental age) Does the patient have difficulty walking or climbing stairs?: No Weakness of Legs: None Weakness of Arms/Hands: None  Permission Sought/Granted Permission sought to share information with : Case Manager, Family Supports Permission granted to share information with : Yes, Verbal Permission Granted              Emotional Assessment Appearance:: Appears stated age Attitude/Demeanor/Rapport: Engaged, Gracious Affect (typically observed): Accepting, Appropriate, Calm, Hopeful, Pleasant Orientation: : Oriented to Self, Oriented to Place, Oriented to  Time, Oriented to Situation Alcohol / Substance Use: Not Applicable Psych Involvement: No (comment)  Admission diagnosis:  Hypoxia [R09.02] CAP (community acquired pneumonia) [J18.9] Community acquired pneumonia, unspecified laterality [J18.9] Patient Active Problem List   Diagnosis Date Noted   Acute respiratory failure with hypoxia ([place]) 03/11/2022   Nicotine dependence 03/11/2022   Normocytic anemia 03/11/2022  CAP (community acquired pneumonia) 03/10/2022   Sacral insufficiency fracture 09/22/2021   Atypical pneumonia    Closed fracture of right ankle    Altered mental status 12/15/2019   Myoclonic jerking    PCP:  Leonard Downing, MD Pharmacy:   [facility] [address] [place] [address] Phone: [phone] Fax: [facility] [place] [address] Phone: [phone] Fax: [phone]     Social Determinants of Health (SDOH) Interventions    Readmission Risk Interventions     No data to display

## 2022-03-12 NOTE — Progress Notes (Signed)
Physical Therapy Treatment Patient Details Name: Sheryl Campbell MRN: [id number] DOB: 1960/10/22 Today's Date: 03/12/2022   History of Present Illness Pt is a 61 y/o female admitted secondary to respiratory failure from PNA. PMH includes tobacco use, fibromyalgia and skin cancer.    PT Comments    Pt supine in bed on entry, agreeable to working with therapy. Pt on 2L O2 via [place] for comfort and is able to ambulate in hallway on RA with SpO2 90%O2. Pt is independent with bed mobility, transfers and ambulation without AD. Pt able to ascend/descent flight of stairs with use of rail at a mod I level. Pt scores 23/24 on DGI which is indicative of low fall risk. Pt has met her goals. PT will sign off and transfer regular mobility to Mobility Team.     Recommendations for follow up therapy are one component of a multi-disciplinary discharge planning process, led by the attending physician.  Recommendations may be updated based on patient status, additional functional criteria and insurance authorization.  Follow Up Recommendations  No PT follow up     Assistance Recommended at Discharge Intermittent Supervision/Assistance  Patient can return home with the following Assistance with cooking/housework   Equipment Recommendations  None recommended by PT       Precautions / Restrictions Precautions Precautions: None Restrictions Weight Bearing Restrictions: No     Mobility  Bed Mobility Overal bed mobility: Independent                  Transfers Overall transfer level: Independent                 General transfer comment: good power up and self steady    Ambulation/Gait Ambulation/Gait assistance: Independent Gait Distance (Feet): 500 Feet Assistive device: None Gait Pattern/deviations: Step-through pattern, Decreased stride length Gait velocity: WFL Gait velocity interpretation: >4.37 ft/sec, indicative of normal walking speed   General Gait Details: good step  through pattern, good dynamic balance   Stairs Stairs: Yes Stairs assistance: Modified independent (Device/Increase time) Stair Management: One rail Left, Forwards, Alternating pattern Number of Stairs: 15 General stair comments: supervision for safety, step over step pattern, no LoB able to carry on conversation while navigating stairs      Balance Overall balance assessment: No apparent balance deficits (not formally assessed)                               Standardized Balance Assessment Standardized Balance Assessment : Dynamic Gait Index   Dynamic Gait Index Level Surface: Normal Change in Gait Speed: Normal Gait with Horizontal Head Turns: Normal Gait with Vertical Head Turns: Normal Gait and Pivot Turn: Normal Step Over Obstacle: Normal Step Around Obstacles: Normal Steps: Mild Impairment Total Score: 23      Cognition Arousal/Alertness: Awake/alert Behavior During Therapy: WFL for tasks assessed/performed Overall Cognitive Status: Within Functional Limits for tasks assessed                                             General Comments General comments (skin integrity, edema, etc.): ambulated on RA with SpO2 of 92%O2 and HR 80bpm      Pertinent Vitals/Pain Pain Assessment Pain Assessment: No/denies pain     PT Goals (current goals can now be found in the care plan section) Acute Rehab PT  Goals Patient Stated Goal: to go home PT Goal Formulation: With patient Time For Goal Achievement: 03/25/22 Potential to Achieve Goals: Good Progress towards PT goals: Goals met/education completed, patient discharged from PT    Frequency    Min 3X/week      PT Plan Current plan remains appropriate       AM-PAC PT "6 Clicks" Mobility   Outcome Measure  Help needed turning from your back to your side while in a flat bed without using bedrails?: None Help needed moving from lying on your back to sitting on the side of a flat bed  without using bedrails?: None Help needed moving to and from a bed to a chair (including a wheelchair)?: A Little Help needed standing up from a chair using your arms (e.g., wheelchair or bedside chair)?: A Little Help needed to walk in hospital room?: A Little Help needed climbing 3-5 steps with a railing? : A Little 6 Click Score: 20    End of Session Equipment Utilized During Treatment: Gait belt Activity Tolerance: Patient limited by fatigue Patient left: in bed;with call bell/phone within reach (on stretcher in ED) Nurse Communication: Mobility status PT Visit Diagnosis: Other abnormalities of gait and mobility (R26.89)     Time: 9518-8416 PT Time Calculation (min) (ACUTE ONLY): 17 min  Charges:  $Gait Training: 8-22 mins                     Masaki Rothbauer B. Migdalia Dk PT, DPT Acute Rehabilitation Services Please use secure chat or  Call Office [phone]    [place] 03/12/2022, 12:31 PM

## 2022-03-13 DIAGNOSIS — F17210 Nicotine dependence, cigarettes, uncomplicated: Secondary | ICD-10-CM

## 2022-03-13 DIAGNOSIS — R0902 Hypoxemia: Secondary | ICD-10-CM | POA: Diagnosis not present

## 2022-03-13 DIAGNOSIS — J9601 Acute respiratory failure with hypoxia: Secondary | ICD-10-CM | POA: Diagnosis not present

## 2022-03-13 DIAGNOSIS — J189 Pneumonia, unspecified organism: Secondary | ICD-10-CM | POA: Diagnosis not present

## 2022-03-13 MED ORDER — AZITHROMYCIN 500 MG PO TABS: 500.0000 mg | ORAL_TABLET | Freq: Every day | ORAL | 0 refills | Status: AC

## 2022-03-13 MED ORDER — AZITHROMYCIN 500 MG PO TABS: 500.0000 mg | ORAL_TABLET | Freq: Every day | ORAL | Status: DC

## 2022-03-13 MED ORDER — CEFPODOXIME PROXETIL 200 MG PO TABS: 200.0000 mg | ORAL_TABLET | Freq: Two times a day (BID) | ORAL | 0 refills | Status: AC

## 2022-03-13 NOTE — Care Management Important Message (Signed)
Important Message  Patient Details  Name: Sheryl Campbell MRN: [id number] Date of Birth: Sep 29, 1960   Medicare Important Message Given:  Yes     Orbie Pyo 03/13/2022, 2:52 PM

## 2022-03-13 NOTE — Discharge Summary (Signed)
Physician Discharge Summary   Patient: Sheryl Campbell MRN: [id number] DOB: [birth year]  Admit date:     03/10/2022  Discharge date: 03/13/22  Discharge Physician: Estill Cotta   PCP: Leonard Downing, MD   Recommendations at discharge:   Continue Zithromax 500 mg daily for 5 days Continue Vantin 200 mg twice daily for 5 days  Discharge Diagnoses:    Acute respiratory failure with hypoxia ([place]), resolved   CAP (community acquired pneumonia)   Nicotine dependence   Normocytic anemia Generalized debility    Hospital Course:  Briefly patient is a 61 year old female with tobacco abuse scented to ED with shortness of breath and cough.  Patient reported that she has been having cough over the last couple of days has been worsening with generalized body ache.  Had gone to the urgent care where chest x-ray showed pneumonia and patient was easily getting hypoxic was referred to the ER.  Also reported left-sided pleuritic chest pain. In the ER patient was hypoxic requiring 2 L oxygen.  Chest x-ray confirmed multifocal pneumonia.  COVID-19 test negative   Assessment and Plan:    Acute respiratory failure with hypoxia (HCC) requiring 2 L O2, new secondary to multifocal pneumonia, CAP  -CTA negative for pulmonary embolism, showed multifocal pneumonia  -Urine strep antigen negative, urine Legionella antigen negative. -Patient placed on IV Zithromax, Rocephin, transition to oral Zithromax and Vantin at the time of discharge.  O2 weaned off -Feeling a lot closer to her baseline.      CAP (community acquired pneumonia) -As #1     Nicotine dependence -Counseled strongly on quitting smoking -Placed on nicotine patch     Normocytic anemia -Appears chronic,  Baseline hemoglobin 11.0 -No history of any bleeding, hemoglobin 10.9 at discharge, outpatient work-up suggested   Generalized debility -PT OT evaluation, did fairly well, weaned off of O2     Pain control - [place]  Controlled Substance Reporting System database was reviewed. and patient was instructed, not to drive, operate heavy machinery, perform activities at heights, swimming or participation in water activities or provide baby-sitting services while on Pain, Sleep and Anxiety Medications; until their outpatient Physician has advised to do so again. Also recommended to not to take more than prescribed Pain, Sleep and Anxiety Medications.  Consultants: None Procedures performed: None Disposition: Home Diet recommendation:  Discharge Diet Orders (From admission, onward)     Start     Ordered   03/13/22 0000  Diet general        03/13/22 0954           Cardiac diet DISCHARGE MEDICATION: Allergies as of 03/13/2022   No Known Allergies      Medication List     TAKE these medications    albuterol 108 (90 Base) MCG/ACT inhaler Commonly known as: VENTOLIN HFA Inhale into the lungs See admin instructions. Inhale 1-2 puffs into the lungs  every 4-6 hours as needed for wheezing/ shortness of breath   alendronate 70 MG tablet Commonly known as: FOSAMAX Take 70 mg by mouth once a week.   ARIPiprazole 2 MG tablet Commonly known as: ABILIFY Take 2 mg by mouth daily.   azithromycin 500 MG tablet Commonly known as: ZITHROMAX Take 1 tablet (500 mg total) by mouth daily for 5 days.   baclofen 10 MG tablet Commonly known as: LIORESAL Take 1 tablet by mouth every 6 (six) hours as needed for pain.   cefpodoxime 200 MG tablet Commonly known as: VANTIN Take  1 tablet (200 mg total) by mouth 2 (two) times daily for 5 days.   diclofenac Sodium 1 % Gel Commonly known as: VOLTAREN Apply 1 application topically 4 (four) times daily as needed for pain.   DULoxetine 60 MG capsule Commonly known as: CYMBALTA Take 60 mg by mouth daily.   fentaNYL 50 MCG/HR Commonly known as: DURAGESIC 1 patch every 3 (three) days.   gabapentin 300 MG capsule Commonly known as: NEURONTIN Take 300 mg by mouth 4  (four) times daily.   HYDROcodone-acetaminophen 10-325 MG tablet Commonly known as: NORCO Take 1-2 tablets by mouth 2 (two) times daily as needed for moderate pain.   MULTIVITAMIN ADULT PO Take 1 tablet by mouth daily.   POTASSIUM PO Take 1 tablet by mouth daily.   QC TUMERIC COMPLEX PO Take 1 tablet by mouth daily.        Follow-up Information     Leonard Downing, MD. Schedule an appointment as soon as possible for a visit in 2 week(s).   Specialty: Family Medicine Why: for hospital follow-up Contact information: [address] [place] [phone]                Discharge Exam: Danley Danker Weights   03/10/22 1753  Weight: 52.6 kg   S: Feeling a lot better today, hoping to go home, no fevers or chills or chest pain or shortness of breath.  Vitals:   03/12/22 2025 03/13/22 0030 03/13/22 0448 03/13/22 1019  BP: (!) 154/86  (!) 146/83 129/82  Pulse: 75  74 76  Resp: '17  18 17  '$ Temp: 98 F (36.7 C)  97.6 F (36.4 C) 98.4 F (36.9 C)  TempSrc:   Oral   SpO2: 98% 94% 94% 96%  Weight:      Height:        Physical Exam General: Alert and oriented x 3, NAD Cardiovascular: S1 S2 clear, RRR.  Respiratory: CTAB, no wheezing, rales or rhonchi Gastrointestinal: Soft, nontender, nondistended, NBS Ext: no pedal edema bilaterally Neuro: no new deficits Psych: Normal affect and demeanor, alert and oriented x3   Condition at discharge: fair  The results of significant diagnostics from this hospitalization (including imaging, microbiology, ancillary and laboratory) are listed below for reference.   Imaging Studies: CT Angio Chest Pulmonary Embolism (PE) W or WO Contrast  Result Date: 03/11/2022 CLINICAL DATA:  Concern for pulmonary embolism. EXAM: CT ANGIOGRAPHY CHEST WITH CONTRAST TECHNIQUE: Multidetector CT imaging of the chest was performed using the standard protocol during bolus administration of intravenous contrast. Multiplanar CT image  reconstructions and MIPs were obtained to evaluate the vascular anatomy. RADIATION DOSE REDUCTION: This exam was performed according to the departmental dose-optimization program which includes automated exposure control, adjustment of the mA and/or kV according to patient size and/or use of iterative reconstruction technique. CONTRAST:  10m OMNIPAQUE IOHEXOL 350 MG/ML SOLN COMPARISON:  Chest CT dated 08/21/2021. FINDINGS: Evaluation is limited due to streak artifact caused by patient's arms and spinal hardware. Cardiovascular: Top-normal cardiac size. No pericardial effusion. The thoracic aorta is unremarkable. The origins of the great vessels of the aortic arch appear patent. No pulmonary artery embolus identified. Mediastinum/Nodes: Mildly enlarged bilateral hilar lymph nodes. The esophagus is grossly unremarkable. No mediastinal fluid collection. Lungs/Pleura: Diffuse interstitial prominence and patchy areas of ground-glass opacity throughout the lungs predominantly involving the mid and lower lung fields. Findings most consistent with multilobar pneumonia and less likely edema. Clinical correlation is recommended. Trace bilateral pleural effusions.  No pneumothorax. The central airways are patent. Upper Abdomen: No acute abnormality. Musculoskeletal: No acute osseous pathology. Osteopenia with degenerative changes of the spine. Scoliosis and spinal Harrington rods. Review of the MIP images confirms the above findings. IMPRESSION: 1. No CT evidence of pulmonary artery embolus. 2. Findings most consistent with multilobar pneumonia and less likely edema. 3. Trace bilateral pleural effusions. 4. Mildly enlarged bilateral hilar lymph nodes, likely reactive. Electronically Signed   By: Anner Crete M.D.   On: 03/11/2022 00:54   DG Chest 2 View  Result Date: 03/10/2022 CLINICAL DATA:  Painful inspiration x3 days. EXAM: CHEST - 2 VIEW COMPARISON:  August 19, 2021 FINDINGS: The heart size and mediastinal  contours are within normal limits. Mild to moderate severity infiltrates are seen within the mid and lower lung fields, bilaterally. Small bilateral pleural effusions are noted. No pneumothorax is identified. Radiopaque surgical clips are noted within the right upper quadrant. Postoperative changes are seen within the cervical and thoracic spine. IMPRESSION: 1. Mild to moderate severity bilateral infiltrates. 2. Small bilateral pleural effusions. Electronically Signed   By: Virgina Norfolk M.D.   On: 03/10/2022 19:22    Microbiology: Results for orders placed or performed during the hospital encounter of 03/10/22  SARS Coronavirus 2 by RT PCR (hospital order, performed in [facility] hospital lab) *cepheid single result test* Anterior Nasal Swab     Status: None   Collection Time: 03/10/22  6:10 PM   Specimen: Anterior Nasal Swab  Result Value Ref Range Status   SARS Coronavirus 2 by RT PCR NEGATIVE NEGATIVE Final    Comment: (NOTE) SARS-CoV-2 target nucleic acids are NOT DETECTED.  The SARS-CoV-2 RNA is generally detectable in upper and lower respiratory specimens during the acute phase of infection. The lowest concentration of SARS-CoV-2 viral copies this assay can detect is 250 copies / mL. A negative result does not preclude SARS-CoV-2 infection and should not be used as the sole basis for treatment or other patient management decisions.  A negative result may occur with improper specimen collection / handling, submission of specimen other than nasopharyngeal swab, presence of viral mutation(s) within the areas targeted by this assay, and inadequate number of viral copies (<250 copies / mL). A negative result must be combined with clinical observations, patient history, and epidemiological information.  Fact Sheet for Patients:   https://www.patel.info/  Fact Sheet for Healthcare Providers: https://hall.com/  This test is not yet approved  or  cleared by the [place] FDA and has been authorized for detection and/or diagnosis of SARS-CoV-2 by FDA under an Emergency Use Authorization (EUA).  This EUA will remain in effect (meaning this test can be used) for the duration of the COVID-19 declaration under Section 564(b)(1) of the Act, 21 U.S.C. section 360bbb-3(b)(1), unless the authorization is terminated or revoked sooner.  Performed at [facility], [place] [address] [place], [place] [address]   Expectorated Sputum Assessment w Gram Stain, Rflx to Resp Cult     Status: None   Collection Time: 03/10/22 11:29 PM   Specimen: Expectorated Sputum  Result Value Ref Range Status   Specimen Description EXPECTORATED SPUTUM  Final   Special Requests NONE  Final   Sputum evaluation   Final    Sputum specimen not acceptable for testing.  Please recollect.   NOTIFIED RN GABBY BAUTISTA ON 03/11/22 @ 2056 BY DRT Performed at [facility], [place] [address], [place], [place] [address]    Report Status 03/11/2022 FINAL  Final    Labs: CBC: Recent Labs  Lab 03/10/22 1836 03/11/22 0124  WBC 7.9 5.8  NEUTROABS 6.8  --   HGB 11.6* 10.9*  HCT 36.7 36.0  MCV 90.4 92.8  PLT 305 865   Basic Metabolic Panel: Recent Labs  Lab 03/10/22 1836 03/11/22 0124 03/11/22 0445  NA 138  --  137  K 3.5  --  4.7  CL 98  --  102  CO2 28  --  25  GLUCOSE 109*  --  139*  BUN 8  --  14  CREATININE 0.50 0.67 0.53  CALCIUM 9.2  --  8.5*   Liver Function Tests: No results for input(s): "AST", "ALT", "ALKPHOS", "BILITOT", "PROT", "ALBUMIN" in the last 168 hours. CBG: No results for input(s): "GLUCAP" in the last 168 hours.  Discharge time spent: greater than 30 minutes.  Signed: Estill Cotta, MD [facility] 03/13/2022

## 2022-03-13 NOTE — TOC Transition Note (Signed)
Transition of Care [place]) - CM/SW Discharge Note   Patient Details  Name: Sheryl Campbell MRN: [id number] Date of Birth: [birth year]  [facility] CM/SW Contact:  Tom-Johnson, Renea Ee, RN Phone Number: 03/13/2022, 10:31 AM   Clinical Narrative:     Patient is scheduled for discharge today. Currently on room air. No PT f/u noted. Denies any TOC needs. Husband to transport at discharge. No Further TOC needs noted.   Final next level of care: Home/Self Care Barriers to Discharge: Barriers Resolved   Patient Goals and CMS Choice Patient states their goals for this hospitalization and ongoing recovery are:: To return home CMS Medicare.gov Compare Post Acute Care list provided to:: Patient Choice offered to / list presented to : NA  Discharge Placement                Patient to be transferred to facility by: Husband      Discharge Plan and Services   Discharge Planning Services: CM Consult Post Acute Care Choice: NA          DME Arranged: N/A DME Agency: NA       HH Arranged: NA HH Agency: NA        Social Determinants of Health (SDOH) Interventions     Readmission Risk Interventions     No data to display

## 2022-03-13 NOTE — Plan of Care (Signed)

## 2022-04-15 ENCOUNTER — Telehealth: Payer: Self-pay | Admitting: *Deleted

## 2022-04-15 NOTE — Telephone Encounter (Signed)
Received call from pt. She states that her PCP is re-referring her back to [place] for her anemia, but pt was unclear as to exactly why. She received a call yesterday to schedule her back here in October to see Dede Query, PA again.  Irene-have you had any communication about this situation from Dr. Arelia Sneddon?

## 2022-04-22 ENCOUNTER — Other Ambulatory Visit: Payer: PPO

## 2022-04-22 ENCOUNTER — Ambulatory Visit: Payer: PPO | Admitting: Physician Assistant

## 2022-04-22 NOTE — Telephone Encounter (Signed)
Spoke with Sheryl Campbell, Dr Arelia Sneddon nurse, and pt has an appt Tuesday to review recent lab work.  If he still feels she needs to be seen here they will call and schedule an appt.

## 2022-04-28 ENCOUNTER — Other Ambulatory Visit: Payer: Self-pay | Admitting: *Deleted

## 2022-04-28 ENCOUNTER — Other Ambulatory Visit: Payer: Self-pay | Admitting: Physician Assistant

## 2022-04-28 DIAGNOSIS — D649 Anemia, unspecified: Secondary | ICD-10-CM

## 2022-04-29 ENCOUNTER — Inpatient Hospital Stay: Payer: PPO | Admitting: Physician Assistant

## 2022-04-29 ENCOUNTER — Inpatient Hospital Stay: Payer: PPO | Attending: Family Medicine

## 2022-05-13 ENCOUNTER — Other Ambulatory Visit: Payer: Self-pay

## 2022-05-13 MED ORDER — HYDROCODONE-ACETAMINOPHEN 10-325 MG PO TABS
1.0000 | ORAL_TABLET | Freq: Four times a day (QID) | ORAL | 0 refills | Status: AC | PRN
  Filled 2022-05-13: qty 120, 30d supply, fill #0

## 2022-05-14 ENCOUNTER — Ambulatory Visit
Admission: RE | Admit: 2022-05-14 | Discharge: 2022-05-14 | Disposition: A | Discharge status: Home / Self Care | Payer: PPO | Source: Ambulatory Visit | Attending: Family Medicine | Admitting: Family Medicine

## 2022-05-14 ENCOUNTER — Other Ambulatory Visit: Payer: Self-pay | Admitting: Family Medicine

## 2022-05-14 DIAGNOSIS — R0602 Shortness of breath: Secondary | ICD-10-CM

## 2022-05-20 ENCOUNTER — Other Ambulatory Visit: Payer: Self-pay | Admitting: Family Medicine

## 2022-05-20 DIAGNOSIS — R0602 Shortness of breath: Secondary | ICD-10-CM

## 2022-05-20 DIAGNOSIS — R609 Edema, unspecified: Secondary | ICD-10-CM

## 2022-05-20 DIAGNOSIS — I509 Heart failure, unspecified: Secondary | ICD-10-CM

## 2022-05-25 ENCOUNTER — Ambulatory Visit: Payer: PPO

## 2022-05-25 DIAGNOSIS — I509 Heart failure, unspecified: Secondary | ICD-10-CM

## 2022-05-25 DIAGNOSIS — R609 Edema, unspecified: Secondary | ICD-10-CM

## 2022-05-25 DIAGNOSIS — R0602 Shortness of breath: Secondary | ICD-10-CM

## 2022-06-08 ENCOUNTER — Other Ambulatory Visit: Payer: Self-pay

## 2022-06-15 ENCOUNTER — Other Ambulatory Visit: Payer: Self-pay

## 2022-06-15 DIAGNOSIS — R0609 Other forms of dyspnea: Secondary | ICD-10-CM

## 2022-06-16 ENCOUNTER — Encounter (HOSPITAL_BASED_OUTPATIENT_CLINIC_OR_DEPARTMENT_OTHER): Payer: PPO

## 2022-06-22 ENCOUNTER — Encounter (HOSPITAL_BASED_OUTPATIENT_CLINIC_OR_DEPARTMENT_OTHER): Payer: PPO

## 2022-06-25 ENCOUNTER — Encounter (HOSPITAL_BASED_OUTPATIENT_CLINIC_OR_DEPARTMENT_OTHER): Payer: PPO

## 2022-06-29 ENCOUNTER — Encounter (HOSPITAL_BASED_OUTPATIENT_CLINIC_OR_DEPARTMENT_OTHER): Payer: PPO

## 2022-07-23 NOTE — Progress Notes (Deleted)
New Patient Note  RE: ANUREET BRUINGTON MRN: [id number] DOB: 1961-05-13 Date of Office Visit: 07/24/2022  Consult requested by: Leonard Downing, * Primary care provider: Leonard Downing, MD  Chief Complaint: No chief complaint on file.  History of Present Illness: I had the pleasure of seeing Sheryl Campbell for initial evaluation at the [facility] on 07/23/2022. She is a 62 y.o. female, who is referred here by Leonard Downing, MD for the evaluation of asthma.  She reports symptoms of *** chest tightness, shortness of breath, coughing, wheezing, nocturnal awakenings for *** years. Current medications include *** which help. She reports *** using aerochamber with inhalers. She tried the following inhalers: ***. Main triggers are ***allergies, infections, weather changes, smoke, exercise, pet exposure. In the last month, frequency of symptoms: ***x/week. Frequency of nocturnal symptoms: ***x/month. Frequency of SABA use: ***x/week. Interference with physical activity: ***. Sleep is ***disturbed. In the last 12 months, emergency room visits/urgent care visits/doctor office visits or hospitalizations due to respiratory issues: ***. In the last 12 months, oral steroids courses: ***. Lifetime history of hospitalization for respiratory issues: ***. Prior intubations: ***. Asthma was diagnosed at age *** by ***. History of pneumonia: ***. She was evaluated by allergist ***pulmonologist in the past. Smoking exposure: ***. Up to date with flu vaccine: ***. Up to date with pneumonia vaccine: ***. Up to date with COVID-19 vaccine: ***. Prior Covid-19 infection: ***. History of reflux: ***.  Assessment and Plan: Sophina is a 62 y.o. female with: No problem-specific Assessment & Plan notes found for this encounter.  No follow-ups on file.  No orders of the defined types were placed in this encounter.  Lab Orders  No laboratory test(s) ordered today    Other allergy  screening: Asthma: {Blank single:19197::"yes","no"} Rhino conjunctivitis: {Blank single:19197::"yes","no"} Food allergy: {Blank single:19197::"yes","no"} Medication allergy: {Blank single:19197::"yes","no"} Hymenoptera allergy: {Blank single:19197::"yes","no"} Urticaria: {Blank single:19197::"yes","no"} Eczema:{Blank single:19197::"yes","no"} History of recurrent infections suggestive of immunodeficency: {Blank single:19197::"yes","no"}  Diagnostics: Spirometry:  Tracings reviewed. Her effort: {Blank single:19197::"Good reproducible efforts.","It was hard to get consistent efforts and there is a question as to whether this reflects a maximal maneuver.","Poor effort, data can not be interpreted."} FVC: ***L FEV1: ***L, ***% predicted FEV1/FVC ratio: ***% Interpretation: {Blank single:19197::"Spirometry consistent with mild obstructive disease","Spirometry consistent with moderate obstructive disease","Spirometry consistent with severe obstructive disease","Spirometry consistent with possible restrictive disease","Spirometry consistent with mixed obstructive and restrictive disease","Spirometry uninterpretable due to technique","Spirometry consistent with normal pattern","No overt abnormalities noted given today's efforts"}.  Please see scanned spirometry results for details.  Skin Testing: {Blank single:19197::"Select foods","Environmental allergy panel","Environmental allergy panel and select foods","Food allergy panel","None","Deferred due to recent antihistamines use"}. *** Results discussed with patient/family.   Past Medical History: Patient Active Problem List   Diagnosis Date Noted  . Acute respiratory failure with hypoxia ([place]) 03/11/2022  . Nicotine dependence 03/11/2022  . Normocytic anemia 03/11/2022  . CAP (community acquired pneumonia) 03/10/2022  . Sacral insufficiency fracture 09/22/2021  . Atypical pneumonia   . Closed fracture of right ankle   . Altered mental status  12/15/2019  . Myoclonic jerking    Past Medical History:  Diagnosis Date  . Arthritis   . Bell's palsy   . Cancer ([place])    skin (not melanoma)  . Fibromyalgia   . Intervertebral disk disease    "my whole spine"  . Osteopenia   . Sciatica   . Scoliosis    Past Surgical History: Past Surgical History:  Procedure Laterality Date  .  BACK SURGERY  05/2004   Harrington rods T3- T11  . BUNIONECTOMY Bilateral 07/2001  . CHOLECYSTECTOMY  02/2000  . NECK SURGERY  10/2002   cervical diskectomy, C5-C7  . NOSE SURGERY  07/1993   deviated septum  . ORIF ANKLE FRACTURE Right 12/18/2019   Procedure: OPEN REDUCTION INTERNAL FIXATION RIGHT ANKLE FRACTURE;  Surgeon: Meredith Pel, MD;  Location: [place];  Service: Orthopedics;  Laterality: Right;   Medication List:  Current Outpatient Medications  Medication Sig Dispense Refill  . albuterol (VENTOLIN HFA) 108 (90 Base) MCG/ACT inhaler Inhale into the lungs See admin instructions. Inhale 1-2 puffs into the lungs  every 4-6 hours as needed for wheezing/ shortness of breath    . alendronate (FOSAMAX) 70 MG tablet Take 70 mg by mouth once a week.    . ARIPiprazole (ABILIFY) 2 MG tablet Take 2 mg by mouth daily.     . baclofen (LIORESAL) 10 MG tablet Take 1 tablet by mouth every 6 (six) hours as needed for pain.    Marland Kitchen diclofenac Sodium (VOLTAREN) 1 % GEL Apply 1 application topically 4 (four) times daily as needed for pain.    . DULoxetine (CYMBALTA) 60 MG capsule Take 60 mg by mouth daily.    . fentaNYL (DURAGESIC) 50 MCG/HR 1 patch every 3 (three) days.    Marland Kitchen gabapentin (NEURONTIN) 300 MG capsule Take 300 mg by mouth 4 (four) times daily.     Marland Kitchen HYDROcodone-acetaminophen (NORCO) 10-325 MG tablet Take 1-2 tablets by mouth 2 (two) times daily as needed for moderate pain.    Marland Kitchen HYDROcodone-acetaminophen (NORCO) 10-325 MG tablet Take 1 tablet by mouth 4 (four) times daily as needed. 120 tablet 0  . Multiple Vitamin (MULTIVITAMIN ADULT PO) Take 1  tablet by mouth daily.    Marland Kitchen POTASSIUM PO Take 1 tablet by mouth daily.    . Turmeric (QC TUMERIC COMPLEX PO) Take 1 tablet by mouth daily.     No current facility-administered medications for this visit.   Allergies: No Known Allergies Social History: Social History   Socioeconomic History  . Marital status: Married    Spouse name: Fritz Pickerel  . Number of children: 0  . Years of education: 70  . Highest education level: Not on file  Occupational History    Comment: disabled  Tobacco Use  . Smoking status: Former    Packs/day: 1.00    Years: 40.00    Total pack years: 40.00    Types: Cigarettes    Quit date: 07/21/2021    Years since quitting: 1.0  . Smokeless tobacco: Never  Substance and Sexual Activity  . Alcohol use: No    Comment: rare  . Drug use: No  . Sexual activity: Not on file  Other Topics Concern  . Not on file  Social History Narrative   Lives with husband   Caffeine- coffee, 2-3 cups daily; sodas 1 daily   Social Determinants of Health   Financial Resource Strain: Not on file  Food Insecurity: Not on file  Transportation Needs: Not on file  Physical Activity: Not on file  Stress: Not on file  Social Connections: Not on file   Lives in a ***. Smoking: *** Occupation: ***  Environmental HistoryFreight forwarder in the house: Estate agent in the family room: {Blank single:19197::"yes","no"} Carpet in the bedroom: {Blank single:19197::"yes","no"} Heating: {Blank single:19197::"electric","gas","heat pump"} Cooling: {Blank single:19197::"central","window","heat pump"} Pet: {Blank single:19197::"yes ***","no"}  Family History: Family History  Problem Relation Age of Onset  .  Cancer - Lung Mother        smoker  . Diabetes Sister   . Diabetes Brother   . Cancer Maternal Aunt        Anal  . Breast cancer Maternal Aunt   . Lung cancer Maternal Aunt    Problem                               Relation Asthma                                    *** Eczema                                *** Food allergy                          *** Allergic rhino conjunctivitis     ***  Review of Systems  Constitutional:  Negative for appetite change, chills, fever and unexpected weight change.  HENT:  Negative for congestion and rhinorrhea.   Eyes:  Negative for itching.  Respiratory:  Negative for cough, chest tightness, shortness of breath and wheezing.   Cardiovascular:  Negative for chest pain.  Gastrointestinal:  Negative for abdominal pain.  Genitourinary:  Negative for difficulty urinating.  Skin:  Negative for rash.  Neurological:  Negative for headaches.   Objective: There were no vitals taken for this visit. There is no height or weight on file to calculate BMI. Physical Exam Vitals and nursing note reviewed.  Constitutional:      Appearance: Normal appearance. She is well-developed.  HENT:     Head: Normocephalic and atraumatic.     Right Ear: Tympanic membrane and external ear normal.     Left Ear: Tympanic membrane and external ear normal.     Nose: Nose normal.     Mouth/Throat:     Mouth: Mucous membranes are moist.     Pharynx: Oropharynx is clear.  Eyes:     Conjunctiva/sclera: Conjunctivae normal.  Cardiovascular:     Rate and Rhythm: Normal rate and regular rhythm.     Heart sounds: Normal heart sounds. No murmur heard.    No friction rub. No gallop.  Pulmonary:     Effort: Pulmonary effort is normal.     Breath sounds: Normal breath sounds. No wheezing, rhonchi or rales.  Musculoskeletal:     Cervical back: Neck supple.  Skin:    General: Skin is warm.     Findings: No rash.  Neurological:     Mental Status: She is alert and oriented to person, place, and time.  Psychiatric:        Behavior: Behavior normal.  The plan was reviewed with the patient/family, and all questions/concerned were addressed.  It was my pleasure to see Sheryl Campbell today and participate in her care. Please feel free  to contact me with any questions or concerns.  Sincerely,  Rexene Alberts, DO Allergy & Immunology  [facility] office: [place] office: [phone]

## 2022-07-24 ENCOUNTER — Ambulatory Visit: Payer: PPO | Admitting: Allergy

## 2022-08-19 ENCOUNTER — Other Ambulatory Visit: Payer: Self-pay

## 2022-08-19 ENCOUNTER — Encounter: Payer: Self-pay | Admitting: Allergy

## 2022-08-19 ENCOUNTER — Ambulatory Visit: Payer: PPO | Admitting: Allergy

## 2022-08-19 VITALS — BP 132/80 | HR 89 | Temp 98.7°F | Resp 18 | Ht 62.0 in | Wt 121.6 lb

## 2022-08-19 DIAGNOSIS — J310 Chronic rhinitis: Secondary | ICD-10-CM | POA: Diagnosis not present

## 2022-08-19 DIAGNOSIS — R768 Other specified abnormal immunological findings in serum: Secondary | ICD-10-CM | POA: Diagnosis not present

## 2022-08-19 DIAGNOSIS — J449 Chronic obstructive pulmonary disease, unspecified: Secondary | ICD-10-CM

## 2022-08-19 MED ORDER — AEROCHAMBER PLUS FLO-VU MISC: 1 refills | Status: DC

## 2022-08-19 MED ORDER — ALBUTEROL SULFATE HFA 108 (90 BASE) MCG/ACT IN AERS: 2.0000 | INHALATION_SPRAY | RESPIRATORY_TRACT | 1 refills | Status: DC | PRN

## 2022-08-19 MED ORDER — BREZTRI AEROSPHERE 160-9-4.8 MCG/ACT IN AERO: 2.0000 | INHALATION_SPRAY | Freq: Two times a day (BID) | RESPIRATORY_TRACT | 2 refills | Status: DC

## 2022-08-19 NOTE — Progress Notes (Signed)
New Patient Note  RE: Sheryl Campbell MRN: [id number] DOB: February 05, 1961 Date of Office Visit: 08/19/2022   Primary care provider: Leonard Downing, MD  Chief Complaint: Elevated lab  History of present illness: Sheryl Campbell is a 62 y.o. female presenting today for evaluation of elevated IgA, asthma.  She does not think she has asthma.  She states she has had attacks where she feels very congested in the chest and she feels short of breath and will use albuterol inhaler and doesn't really seem to help.  She states these episodes do not seem to happen a lot and is sporadic.  She states the first time this happened it was summer and she had been at the pool outside with her grandchildren.  She states with some of these episodes she has noted swelling of the legs thus she was thinking the symptoms could be related to her heart.  However states she has had cardiology evaluation.  She has had normal EKG and normal ECHO from cardiology back in November.  She states from a hopsitalization from 2021 the records stated CHF however she states she was never told of this diagnosis. Last episode of this was in November.  She does remember a long time ago having a "preventative" inhaler.  She states she used to see an allergist and did do allergy shots for 5-6 years and does not feel it helped that much.  She states her allergist retired and she developed other health issues and thus the allergy issues and questionable asthma became less of a concern.   She did have an elevated IgA.  She had b/l acute sacral insufficiency fractures with bone marrow edema. She started having back pain and ended up having a MRI spin that showed these fractures with bm edema.  She had SPEP with IFE done that showed elevated IgA of 466.  She also had free light chains and UPEP testing as well that was largely unremarkable and was not concerning for monoclonal proliferation or myeloma.  She did see hematology/oncology with  these findings who deemed she did not need any additional work-up and her fractures likely secondary to osteoporosis.  She follows with orthopedics as well.    Review of systems: Review of Systems  Constitutional: Negative.   HENT: Negative.    Eyes: Negative.   Respiratory: Negative.    Cardiovascular: Negative.   Gastrointestinal: Negative.   Musculoskeletal: Negative.   Skin: Negative.   Allergic/Immunologic: Negative.   Neurological: Negative.     All other systems negative unless noted above in HPI  Past medical history: Past Medical History:  Diagnosis Date   Arthritis    Bell's palsy    Cancer ([place])    skin (not melanoma)   Fibromyalgia    Intervertebral disk disease    "my whole spine"   Osteopenia    Sciatica    Scoliosis     Past surgical history: Past Surgical History:  Procedure Laterality Date   BACK SURGERY  05/2004   Harrington rods T3- T11   BUNIONECTOMY Bilateral 07/2001   CHOLECYSTECTOMY  02/2000   NECK SURGERY  10/2002   cervical diskectomy, C5-C7   NOSE SURGERY  07/1993   deviated septum   ORIF ANKLE FRACTURE Right 12/18/2019   Procedure: OPEN REDUCTION INTERNAL FIXATION RIGHT ANKLE FRACTURE;  Surgeon: Meredith Pel, MD;  Location: [facility];  Service: Orthopedics;  Laterality: Right;    Family history:  Family History  Problem Relation Age  of Onset   Cancer - Lung Mother        smoker   Diabetes Sister    Diabetes Brother    Cancer Maternal Aunt        Anal   Breast cancer Maternal Aunt    Lung cancer Maternal Aunt     Social history: Lives in a home with carpeting with electric heating and central cooling. No pets in the home.  No concern for water damage, mildew or roaches in the home.  Not currently working.   Occupational History    Comment: disabled  Tobacco Use   Smoking status: Former    Packs/day: 1.00    Years: 40.00    Total pack years: 40.00    Types: Cigarettes    Quit date: 07/21/2021    Years since quitting: 1.0    Smokeless tobacco: Never     Medication List: Current Outpatient Medications  Medication Sig Dispense Refill   alendronate (FOSAMAX) 70 MG tablet Take 70 mg by mouth once a week.     ARIPiprazole (ABILIFY) 2 MG tablet Take 2 mg by mouth daily.      baclofen (LIORESAL) 10 MG tablet Take 1 tablet by mouth every 6 (six) hours as needed for pain.     BREZTRI AEROSPHERE 160-9-4.8 MCG/ACT AERO Inhale 2 puffs into the lungs in the morning and at bedtime. 10.7 g 2   diclofenac Sodium (VOLTAREN) 1 % GEL Apply 1 application topically 4 (four) times daily as needed for pain.     DULoxetine (CYMBALTA) 60 MG capsule Take 60 mg by mouth daily.     fentaNYL (DURAGESIC) 50 MCG/HR 1 patch every 3 (three) days.     gabapentin (NEURONTIN) 300 MG capsule Take 300 mg by mouth 4 (four) times daily.      HYDROcodone-acetaminophen (NORCO) 10-325 MG tablet Take 1 tablet by mouth 4 (four) times daily as needed. 120 tablet 0   lisinopril (ZESTRIL) 20 MG tablet Take 20 mg by mouth daily.     Multiple Vitamin (MULTIVITAMIN ADULT PO) Take 1 tablet by mouth daily.     Spacer/Aero-Holding Chambers (AEROCHAMBER PLUS WITH MASK) inhaler Use as directed with Metered Dose Inhaler. 1 each 1   Turmeric (QC TUMERIC COMPLEX PO) Take 1 tablet by mouth daily.     albuterol (VENTOLIN HFA) 108 (90 Base) MCG/ACT inhaler Inhale 2 puffs into the lungs every 4 (four) hours as needed for wheezing or shortness of breath. Inhale 1-2 puffs into the lungs  every 4-6 hours as needed for wheezing/ shortness of breath 18 g 1   HYDROcodone-acetaminophen (NORCO) 10-325 MG tablet Take 1-2 tablets by mouth 2 (two) times daily as needed for moderate pain.     POTASSIUM PO Take 1 tablet by mouth daily. (Patient not taking: Reported on 08/19/2022)     No current facility-administered medications for this visit.    Known medication allergies: No Known Allergies   Physical examination: Blood pressure 132/80, pulse 89, temperature 98.7 F (37.1  C), temperature source Temporal, resp. rate 18, height '5\' 2"'$  (1.575 m), weight 121 lb 9.6 oz (55.2 kg), SpO2 96 %.  General: Alert, interactive, in no acute distress. HEENT: PERRLA, TMs pearly gray, turbinates moderately edematous without discharge, post-pharynx non erythematous. Neck: Supple without lymphadenopathy. Lungs: Clear to auscultation without wheezing, rhonchi or rales. {no increased work of breathing. CV: Normal S1, S2 without murmurs. Abdomen: Nondistended, nontender. Skin: Warm and dry, without lesions or rashes. Extremities:  No clubbing, cyanosis or  edema. Neuro:   Grossly intact.  Diagnositics/Labs: Labs:  Component     Latest Ref Rng 09/19/2021  WBC     4.0 - 10.5 K/uL 5.6   RBC     3.87 - 5.11 MIL/uL 3.73 (L)   Hemoglobin     12.0 - 15.0 g/dL 11.0 (L)   HCT     36.0 - 46.0 % 35.0 (L)   MCV     80.0 - 100.0 fL 93.8   MCH     26.0 - 34.0 pg 29.5   MCHC     30.0 - 36.0 g/dL 31.4   RDW     11.5 - 15.5 % 14.9   Platelets     150 - 400 K/uL 288   nRBC     0.0 - 0.2 % 0.0   Neutrophils     % 58   NEUT#     1.7 - 7.7 K/uL 3.2   Lymphocytes     % 34   Lymphocyte #     0.7 - 4.0 K/uL 1.9   Monocytes Relative     % 6   Monocyte #     0.1 - 1.0 K/uL 0.3   Eosinophil     % 2   Eosinophils Absolute     0.0 - 0.5 K/uL 0.1   Basophil     % 0   Basophils Absolute     0.0 - 0.1 K/uL 0.0   Immature Granulocytes     % 0   Abs Immature Granulocytes     0.00 - 0.07 K/uL 0.02   Sodium     135 - 145 mmol/L 138   Potassium     3.5 - 5.1 mmol/L 4.5   Chloride     98 - 111 mmol/L 102   CO2     22 - 32 mmol/L 30   Glucose     70 - 99 mg/dL 83   BUN     6 - 20 mg/dL 10   Creatinine     0.44 - 1.00 mg/dL 0.56   Calcium     8.9 - 10.3 mg/dL 9.2   Total Protein     6.5 - 8.1 g/dL 7.7   Albumin     3.5 - 5.0 g/dL 4.0   AST     15 - 41 U/L 15   ALT     0 - 44 U/L 11   Alkaline Phosphatase     38 - 126 U/L 156 (H)   Total Bilirubin     0.3 - 1.2  mg/dL 0.2 (L)   GFR, Est Non African American     >60 mL/min >60   Anion gap     5 - 15  6   IgG (Immunoglobin G), Serum     586 - 1,602 mg/dL 1,065   IgA     87 - 352 mg/dL 466 (H)   IgM (Immunoglobulin M), Srm     26 - 217 mg/dL 149   Total Protein ELP     6.0 - 8.5 g/dL 7.2 (C)  Albumin SerPl Elph-Mcnc     2.9 - 4.4 g/dL 3.5 (C)  Alpha 1     0.0 - 0.4 g/dL 0.3 (C)  Alpha2 Glob SerPl Elph-Mcnc     0.4 - 1.0 g/dL 1.0 (C)  B-Globulin SerPl Elph-Mcnc     0.7 - 1.3 g/dL 1.1 (C)  Gamma Glob SerPl Elph-Mcnc     0.4 -  1.8 g/dL 1.2 (C)  M Protein SerPl Elph-Mcnc     Not Observed g/dL Not Observed (C)  Globulin, Total     2.2 - 3.9 g/dL 3.7 (C)  Albumin/Glob SerPl     0.7 - 1.7  1.0 (C)  IFE 1 Comment ! (C)  Please Note (HCV): Comment (C)  Kappa free light chain     3.3 - 19.4 mg/L 22.9 (H)   Lambda free light chains     5.7 - 26.3 mg/L 17.0   Kappa, lambda light chain ratio     0.26 - 1.65  1.35       Spirometry: Unable to perform due to history of Bell's palsy and inability to make a tight seal with her lips  Assessment and plan: Reactive (obstructive) airway - symptoms do appear that you have a component of asthma/copd - spirometry/lung function testing unable to perform due to history of Bell's palsy - recommend trial until follow-up with Breztri 2 puffs twice a day.  Rinse mouth after use.  Use with spacer device - have access to albuterol inhaler 2 puffs every 4-6 hours as needed for cough/wheeze/shortness of breath/chest tightness.  May use 15-20 minutes prior to activity.   Monitor frequency of use.    Elevated IgA - your IgA on your previous testing was elevated at 466 (upper normal limist ~ 350) - you have had further testing for elevated IgA with SPEP with immunoflouresents which is the testing to assess for issues like myelomas.  This was also reassurring - you have had a hematology/oncology appointment and they are reassured that you do not have any  hematologic/oncologic issue which is reassuring.  - from my standpoint as immunologist you do not need any further testing regarding elevated IgA.  This usually does not pose any issues  Chronic rhinitis - try use of Ryaltris nasal spray 2 sprays each nostril twice day as needed for nasal drainage or congestion  Follow-up in 3 months or sooner if needed  I appreciate the opportunity to take part in Dazaria's care. Please do not hesitate to contact me with questions.  Sincerely,   Prudy Feeler, MD Allergy/Immunology [facility]

## 2022-08-19 NOTE — Patient Instructions (Signed)
Reactive (obstructive) airway - symptoms do appear that you have a component of asthma/copd - spirometry/lung function testing unable to perform due to history of Bell's palsy - recommend trial until follow-up with Breztri 2 puffs twice a day.  Rinse mouth after use.  Use with spacer device - have access to albuterol inhaler 2 puffs every 4-6 hours as needed for cough/wheeze/shortness of breath/chest tightness.  May use 15-20 minutes prior to activity.   Monitor frequency of use.    Elevated IgA - your IgA on your previous testing was elevated at 466 (upper normal limist ~ 350) - you have had further testing for elevated IgA with SPEP with immunoflouresents which is the testing to assess for issues like myelomas.  This was also reassurring - you have had a hematology/oncology appointment and they are reassured that you do not have any hematologic/oncologic issue which is reassuring.  - from my standpoint as immunologist you do not need any further testing regarding elevated IgA.  This usually does not pose any issues  Nasal congestion/Allergies - try use of Ryaltris nasal spray 2 sprays each nostril twice day as needed for nasal drainage or congestion  Follow-up in 3 months or sooner if needed

## 2022-08-31 ENCOUNTER — Telehealth: Payer: Self-pay

## 2022-08-31 MED ORDER — RYALTRIS 665-25 MCG/ACT NA SUSP: NASAL | 1 refills | Status: DC

## 2022-08-31 NOTE — Telephone Encounter (Signed)
Patient called in - DOB verified - requested [facility]altris [facility] - she's out of nasal spray and never received spacer from pharmacy.  Patient advised Ryaltris prescription will be sent to [facility] - she can pick up another sample until she receives medication.  Patient also requested spacer be sent in - she never received hers from the pharmacy - advised patient she can also pick up a spacer when she picks up her Ryaltris sample.  Patient verbalized understanding, no further questions.

## 2022-08-31 NOTE — Addendum Note (Signed)
Addended by: Tommas Olp B on: 08/31/2022 05:11 PM   Modules accepted: Orders

## 2022-11-18 ENCOUNTER — Ambulatory Visit: Payer: PPO | Admitting: Allergy

## 2022-11-29 ENCOUNTER — Other Ambulatory Visit: Payer: Self-pay | Admitting: Allergy

## 2022-12-02 ENCOUNTER — Other Ambulatory Visit: Payer: Self-pay

## 2022-12-02 ENCOUNTER — Ambulatory Visit (INDEPENDENT_AMBULATORY_CARE_PROVIDER_SITE_OTHER): Payer: PPO | Admitting: Allergy

## 2022-12-02 ENCOUNTER — Encounter: Payer: Self-pay | Admitting: Allergy

## 2022-12-02 VITALS — BP 160/98 | HR 95 | Temp 98.6°F | Resp 16 | Ht 62.0 in | Wt 121.7 lb

## 2022-12-02 DIAGNOSIS — R768 Other specified abnormal immunological findings in serum: Secondary | ICD-10-CM

## 2022-12-02 DIAGNOSIS — J310 Chronic rhinitis: Secondary | ICD-10-CM

## 2022-12-02 DIAGNOSIS — J449 Chronic obstructive pulmonary disease, unspecified: Secondary | ICD-10-CM

## 2022-12-02 NOTE — Patient Instructions (Addendum)
Reactive (obstructive) airway - symptoms do appear that you have a component of asthma/copd - continue Breztri 2 puffs twice a day.  Rinse mouth after use.  Use with spacer device - have access to albuterol inhaler 2 puffs every 4-6 hours as needed for cough/wheeze/shortness of breath/chest tightness.  May use 15-20 minutes prior to activity.   Monitor frequency of use.   - you will be seeing pulmonologist later this month for further recommendations  Nasal congestion/Allergies - at this time we are out of samples of Ryaltris - for similar results can use nasal steroid spray like Flonase, Nasacort, Rhinocort or Nasonex for nasal congestion control AND nasal antihistamine spray Astepro for nasal drainage control.    These are OTC options.    Elevated IgA - your IgA on your previous testing was elevated at 466 (upper normal limist ~ 350) - you have had further testing for elevated IgA with SPEP with immunoflouresents which is the testing to assess for issues like myelomas.  This was also reassurring - you have had a hematology/oncology appointment and they are reassured that you do not have any hematologic/oncologic issue which is reassuring.  - from my standpoint as immunologist you do not need any further testing regarding elevated IgA.  This usually does not pose any issues   Follow-up in 6 months or sooner if needed

## 2022-12-02 NOTE — Progress Notes (Signed)
Follow-up Note  RE: Sheryl Campbell MRN: [id number] DOB: [birth year] Date of Office Visit: 12/02/2022   History of present illness: Sheryl Campbell is a 62 y.o. female presenting today for follow-up of reactive airway, chronic rhinitis and elevated IgA.  She was last seen in the office on 08/19/2022 by myself.  She was hospitalized for 6 days for PNA in [facility] earlier this month.   She has completed antibiotics.  She states when she first got sick she had been around her grandchildren who had been sick with runny nose, sore throat first.  2 days after being around her grandchildren she started to feel sick with fever; she went to [facility] and had treatment including steroid injection, antibiotics and oral prednisone .  However the next day she states her O2 dropped to the 50% and she called EMS.  She was taken to hospital and admitted and treated. She states she is feeling good today.  Later in the month she is seeing a Pulmnologist for evaluation of COPD.  She has continued to use Breztri 2 puffs twice a day.  She does feel the Sheryl Campbell has been helpful.  She states she does still use albuterol couple times a week for symptoms of cough or shortness of breath.  She used the nasal spray sample Ryaltris until she ran out of the sample.  However was not able to fill it through the specialty pharmacy.  She states she used it so long ago she does not remember if it worked but reports that she used the whole sample that it probably was effective.  Review of systems: Review of Systems  Constitutional: Negative.   HENT: Negative.    Eyes: Negative.   Respiratory:  Positive for cough and shortness of breath.        See HPI  Cardiovascular: Negative.   Gastrointestinal: Negative.   Musculoskeletal: Negative.   Skin: Negative.   Allergic/Immunologic: Negative.   Neurological: Negative.      All other systems negative unless noted above in HPI  Past medical/social/surgical/family history have  been reviewed and are unchanged unless specifically indicated below.  No changes  Medication List: Current Outpatient Medications  Medication Sig Dispense Refill   albuterol (VENTOLIN HFA) 108 (90 Base) MCG/ACT inhaler Inhale 2 puffs into the lungs every 4 (four) hours as needed for wheezing or shortness of breath. Inhale 1-2 puffs into the lungs  every 4-6 hours as needed for wheezing/ shortness of breath 18 g 1   alendronate (FOSAMAX) 70 MG tablet Take 70 mg by mouth once a week.     ARIPiprazole (ABILIFY) 2 MG tablet Take 2 mg by mouth daily.      baclofen (LIORESAL) 10 MG tablet Take 1 tablet by mouth every 6 (six) hours as needed for pain.     BREZTRI AEROSPHERE 160-9-4.8 MCG/ACT AERO INHALE 2 PUFFS INTO THE LUNGS IN THE MORNING AND AT BEDTIME 10.7 g 2   diclofenac Sodium (VOLTAREN) 1 % GEL Apply 1 application topically 4 (four) times daily as needed for pain.     DULoxetine (CYMBALTA) 60 MG capsule Take 60 mg by mouth daily.     fentaNYL (DURAGESIC) 50 MCG/HR 1 patch every 3 (three) days.     gabapentin (NEURONTIN) 300 MG capsule Take 300 mg by mouth 4 (four) times daily.      hydrochlorothiazide (HYDRODIURIL) 25 MG tablet Take 25 mg by mouth daily.     HYDROcodone-acetaminophen (NORCO) 10-325 MG tablet Take 1  tablet by mouth 4 (four) times daily as needed. 120 tablet 0   Multiple Vitamin (MULTIVITAMIN ADULT PO) Take 1 tablet by mouth daily.     POTASSIUM PO Take 1 tablet by mouth daily.     Spacer/Aero-Holding Chambers (AEROCHAMBER PLUS WITH MASK) inhaler Use as directed with Metered Dose Inhaler. 1 each 1   Turmeric (QC TUMERIC COMPLEX PO) Take 1 tablet by mouth daily.     No current facility-administered medications for this visit.     Known medication allergies: Allergies  Allergen Reactions   Lisinopril Anaphylaxis     Physical examination: Blood pressure (!) 160/98, pulse 95, temperature 98.6 F (37 C), resp. rate 16, height 5\' 2"  (1.575 m), weight 121 lb 11.2 oz (55.2  kg), SpO2 94 %.  General: Alert, interactive, in no acute distress. HEENT: PERRLA, TMs pearly gray, turbinates non-edematous without discharge, post-pharynx non erythematous. Neck: Supple without lymphadenopathy. Lungs: Clear to auscultation without wheezing, rhonchi or rales. {no increased work of breathing. CV: Normal S1, S2 without murmurs. Abdomen: Nondistended, nontender. Skin: Warm and dry, without lesions or rashes. Extremities:  No clubbing, cyanosis or edema. Neuro:   Grossly intact.  Diagnositics/Labs: None today   Assessment and plan:   Reactive (obstructive) airway - symptoms do appear that you have a component of asthma/copd - continue Breztri 2 puffs twice a day.  Rinse mouth after use.  Use with spacer device - have access to albuterol inhaler 2 puffs every 4-6 hours as needed for cough/wheeze/shortness of breath/chest tightness.  May use 15-20 minutes prior to activity.   Monitor frequency of use.   - you will be seeing pulmonologist later this month for further recommendations  Nasal congestion/Allergies - at this time we are out of samples of Ryaltris - for similar results can use nasal steroid spray like Flonase, Nasacort, Rhinocort or Nasonex for nasal congestion control AND nasal antihistamine spray Astepro for nasal drainage control.    These are OTC options.    Elevated IgA - your IgA on your previous testing was elevated at 466 (upper normal limist ~ 350) - you have had further testing for elevated IgA with SPEP with immunoflouresents which is the testing to assess for issues like myelomas.  This was also reassurring - you have had a hematology/oncology appointment and they are reassured that you do not have any hematologic/oncologic issue which is reassuring.  - from my standpoint as immunologist you do not need any further testing regarding elevated IgA.  This usually does not pose any issues   Follow-up in 6 months or sooner if needed  I appreciate the  opportunity to take part in Sheryl Campbell's care. Please do not hesitate to contact me with questions.  Sincerely,   Margo Aye, MD Allergy/Immunology [facility]

## 2022-12-16 ENCOUNTER — Ambulatory Visit: Payer: PPO | Admitting: Internal Medicine

## 2022-12-16 ENCOUNTER — Encounter: Payer: Self-pay | Admitting: Internal Medicine

## 2022-12-16 VITALS — BP 126/78 | HR 90 | Temp 98.3°F | Ht 60.0 in | Wt 119.6 lb

## 2022-12-16 DIAGNOSIS — J320 Chronic maxillary sinusitis: Secondary | ICD-10-CM

## 2022-12-16 DIAGNOSIS — J4489 Other specified chronic obstructive pulmonary disease: Secondary | ICD-10-CM

## 2022-12-16 DIAGNOSIS — F17210 Nicotine dependence, cigarettes, uncomplicated: Secondary | ICD-10-CM | POA: Diagnosis not present

## 2022-12-16 DIAGNOSIS — J439 Emphysema, unspecified: Secondary | ICD-10-CM

## 2022-12-16 DIAGNOSIS — R0602 Shortness of breath: Secondary | ICD-10-CM | POA: Diagnosis not present

## 2022-12-16 DIAGNOSIS — J309 Allergic rhinitis, unspecified: Secondary | ICD-10-CM

## 2022-12-16 MED ORDER — AZELASTINE HCL 0.1 % NA SOLN: 1.0000 | Freq: Two times a day (BID) | NASAL | 5 refills | Status: DC

## 2022-12-16 MED ORDER — VARENICLINE TARTRATE 1 MG PO TABS: 1.0000 mg | ORAL_TABLET | Freq: Two times a day (BID) | ORAL | 3 refills | Status: DC

## 2022-12-16 NOTE — Progress Notes (Signed)
Sheryl Campbell    [id number]    [birth year]  Primary Care Physician:Elkins, Curly Rim, MD  Referring Physician: Kaleen Mask, MD [address] [place],  [place] [address] Reason for Consultation: shortness of breath, concern for COPD.  Date of Consultation: 12/16/2022  Chief complaint:   Chief Complaint  Patient presents with   Consult    Pt wants to be checked for copd.      HPI: Sheryl Campbell is a 62 y.o. woman who presents with concerns for COPD. She notes for the past 5 years she has been getting annual bronchitis/pneumonia and in the last year has been hospitalized twice. She has never formally been diagnosed and that is why she is here.   She also has dyspnea on exertion from her recent pneumonia where she was hospitalized a month ago. Was hospitalized near [facility] for about a week.   She is able to do her ADLs. But exerting herself makes her short of breath. She does have coughing and wheezing.   She takes albuterol prn (may not use it for weeks at a time) and Breztri 2 puffs twice a day started by Dr. Delorse Lek. She feels this is helping.   She has chronic sinus congestion. She does not take any nasal sprays. Has taken SCIT in the past but didn't find it helpful. Has dustmite, ragweed, tree and grass sensitivities tested in her 30s.   Social history:  Occupation: She has worked as an Insurance underwriter, on disability for chronic pain and back issues.  Exposures: lives at home with husband. Smoking history: 40 pack years, trying to quit  Social History   Occupational History    Comment: disabled  Tobacco Use   Smoking status: Every Day    Packs/day: 1.00    Years: 40.00    Additional pack years: 0.00    Total pack years: 40.00    Types: Cigarettes    Last attempt to quit: 07/21/2021    Years since quitting: 1.4    Passive exposure: Current   Smokeless tobacco: Never  Vaping Use   Vaping Use: Former  Substance and Sexual  Activity   Alcohol use: No    Comment: rare   Drug use: No   Sexual activity: Not on file    Relevant family history:  Family History  Problem Relation Age of Onset   Cancer - Lung Mother        smoker   Lung cancer Mother    Diabetes Sister    Diabetes Brother    Cancer Maternal Aunt        Anal   Breast cancer Maternal Aunt    Lung cancer Maternal Aunt     Past Medical History:  Diagnosis Date   Arthritis    Bell's palsy    Cancer (HCC)    skin (not melanoma)   Fibromyalgia    Intervertebral disk disease    "my whole spine"   Osteopenia    Sciatica    Scoliosis     Past Surgical History:  Procedure Laterality Date   BACK SURGERY  05/2004   Harrington rods T3- T11   BUNIONECTOMY Bilateral 07/2001   CHOLECYSTECTOMY  02/2000   NECK SURGERY  10/2002   cervical diskectomy, C5-C7   NOSE SURGERY  07/1993   deviated septum   ORIF ANKLE FRACTURE Right 12/18/2019   Procedure: OPEN REDUCTION INTERNAL FIXATION RIGHT ANKLE FRACTURE;  Surgeon: Rise Paganini  Lorin Picket, MD;  Location: [facility] OR;  Service: Orthopedics;  Laterality: Right;     Physical Exam: Blood pressure 126/78, pulse 90, temperature 98.3 F (36.8 C), temperature source Oral, height 5' (1.524 m), weight 119 lb 9.6 oz (54.3 kg), SpO2 93 %. Gen:      No acute distress ENT:  no nasal polyps, mucus membranes moist, left ptosis Lungs:    No increased respiratory effort, symmetric chest wall excursion, clear to auscultation bilaterally, no wheezes or crackles CV:         Regular rate and rhythm; no murmurs, rubs, or gallops.  No pedal edema Abd:      + bowel sounds; soft, non-tender; no distension MSK: no acute synovitis of DIP or PIP joints, no mechanics hands.  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 The patient's images have been independently reviewed by me.    PFTs:   Labs:  Lab Results  Component Value Date   NA 137 03/11/2022   K 4.7 03/11/2022   CO2 25 03/11/2022   GLUCOSE 139 (H) 03/11/2022   BUN 14 03/11/2022   CREATININE 0.53 03/11/2022   CALCIUM 8.5 (L) 03/11/2022   GFRNONAA >60 03/11/2022   Lab Results  Component Value Date   WBC 5.8 03/11/2022   HGB 10.9 (L) 03/11/2022   HCT 36.0 03/11/2022   MCV 92.8 03/11/2022   PLT 313 03/11/2022     Immunization status:   There is no immunization history on file for this patient.   I reviewed prior external note(s) from allergy, hospital medicine PCP  I reviewed the result(s) of the labs and imaging as noted above.   I have ordered region 2 allergy panel   Assessment:  COPD, new diagnosis Tobacco use disorder, trying to quit.  Seasonal environmental allergies, chronic  Plan/Recommendations:  Unable to obtain PFTs due to bells palsy, chronic with left sided facial weakness.  Start chantix for smoking cessation.   Continue Breztri 2 puffs twice a day. Continue albuterol inhaler as needed.  Start astelin nasal spray for chronic sinus congestion.  Astelin - 1 spray on each side of your nose twice a day for first week, then 1 spray on each side.   We discussed disease management and progression at length today.   Smoking Cessation Counseling:  1. The patient is an everyday smoker and symptomatic due to the following condition copd 2. The patient is currently contemplative in quitting smoking. 3. I advised patient to quit smoking. 4. We identified patient specific barriers to change.  5. I personally spent 3  minutes counseling the patient regarding tobacco use disorder. 6. We discussed management of stress and anxiety to help with smoking cessation, when applicable. 7. We discussed nicotine replacement  therapy, Wellbutrin, Chantix as possible options. 8. I advised setting a quit date. 9. Follow?up arranged with our office to continue ongoing discussions. 10.Resources given to patient including quit hotline. '   Return to Care: Return in about 3 months (around 03/18/2023).  Durel Salts, MD Pulmonary and Critical Care Medicine [facility] Office:[phone]  CC: Kaleen Mask, *

## 2022-12-16 NOTE — Patient Instructions (Addendum)
Please schedule follow up scheduled with myself in 3-4 months.  If my schedule is not open yet, we will contact you with a reminder closer to that time. Please call [phone] if you haven't heard from [place] a month before.   Continue Breztri 2 puffs twice a day. Continue albuterol inhaler as needed. Start chantix to help quit smoking.  Start astelin nasal spray for chronic sinus congestion.  Astelin - 1 spray on each side of your nose twice a day for first week, then 1 spray on each side.   Instructions for use: If you also use a saline nasal spray or rinse, use that first. Position the head with the chin slightly tucked. Use the right hand to spray into the left nostril and the right hand to spray into the left nostril.   Point the bottle away from the septum of your nose (cartilage that divides the two sides of your nose).  Hold the nostril closed on the opposite side from where you will spray Spray once and gently sniff to pull the medicine into the higher parts of your nose.  Don't sniff too hard as the medicine will drain down the back of your throat instead. Repeat with a second spray on the same side if prescribed. Repeat on the other side of your nose.    Varenicline -- Varenicline (brand name: Chantix) is a prescription medication that works in the brain to reduce nicotine withdrawal symptoms and cigarette cravings. In several studies, it was more effective than placebo (a lookalike substitute that contains no medication.)  It should be taken after eating with a full glass of water as follows: ?One 0.5 mg tablet daily for three days ?One 0.5 mg tablet twice daily for the next four days ?One 1.0 mg tablet twice daily starting at day 7  You should plan to quit smoking between one and four weeks after starting varenicline.   You should continue it for 12 weeks before concluding if it is working; if you successfully quit at 12 weeks, you may continue taking it for an additional 12  weeks. If you have not quit after taking varenicline for 12 weeks, talk to your health care provider about the next step. Options include working harder to make behavioral changes and continuing varenicline or switching to another treatment.  Common side effects of varenicline include nausea and abnormal dreams.  In the very early years of Chantix there were some concerns about Chantix affecting mood and depression.  However, there has been lots of research on this and this is no longer a concern.  Chantix is considered safe to use even in patients taking medication for depression.   In 2011, the [place] Food and Drug Administration (FDA) issued an advisory that, in people who already have heart or blood vessel disease, varenicline may increase the risk of acute heart problems. If there is such a risk, it appears to be small, and is likely outweighed by the benefits of quitting smoking.  What are the benefits of quitting smoking? Quitting smoking can lower your chances of getting or dying from heart disease, lung disease, kidney failure, infection, or cancer. It can also lower your chances of getting osteoporosis, a condition that makes your bones weak. Plus, quitting smoking can help your skin look younger and reduce the chances that you will have problems with sex.  Quitting smoking will improve your health no matter how old you are, and no matter how long or how much you have  smoked.  What should I do if I want to quit smoking? The letters in the word "START" can help you remember the steps to take: S = Set a quit date. T = Tell family, friends, and the people around you that you plan to quit. A = Anticipate or plan ahead for the tough times you'll face while quitting. R = Remove cigarettes and other tobacco products from your home, car, and work. T = Talk to your doctor about getting help to quit.  How can my doctor or nurse help? Your doctor or nurse can give you advice on the best way to quit.  He or she can also put you in touch with counselors or other people you can call for support. Plus, your doctor or nurse can give you medicines to: ?Reduce your craving for cigarettes ?Reduce the unpleasant symptoms that happen when you stop smoking (called "withdrawal symptoms"). You can also get help from a free phone line (1-800-QUIT-NOW) or go online to [employer].  What are the symptoms of withdrawal? The symptoms include: ?Trouble sleeping ?Being irritable, anxious or restless ?Getting frustrated or angry ?Having trouble thinking clearly  Some people who stop smoking become temporarily depressed. Some people need treatment for depression, such as counseling or antidepressant medicines. Depressed people might: ?No longer enjoy or care about doing the things they used to like to do ?Feel sad, down, hopeless, nervous, or cranky most of the day, almost every day ?Lose or gain weight ?Sleep too much or too little ?Feel tired or like they have no energy ?Feel guilty or like they are worth nothing ?Forget things or feel confused ?Move and speak more slowly than usual ?Act restless or have trouble staying still ?Think about death or suicide  If you think you might be depressed, see your doctor or nurse. Only someone trained in mental health can tell for sure if you are depressed. If you ever feel like you might hurt yourself, go straight to the nearest emergency department. Or you can call for an ambulance (in the [place] and [place], dial 9-1-1) or call your doctor or nurse right away and tell them it is an emergency. You can also reach the [place] National Suicide Prevention Lifeline at (463)664-3404 or http://hill.com/.  How do medicines help you stop smoking? Different medicines work in different ways: ?Nicotine replacement therapy eases withdrawal and reduces your body's craving for nicotine, the main drug found in cigarettes. There are different forms of nicotine  replacement, including skin patches, lozenges, gum, nasal sprays, and "puffers" or inhalers. Many can be bought without a prescription, while others might require one. ?Bupropion is a prescription medicine that reduces your desire to smoke. This medicine is sold under the brand names Zyban and Wellbutrin. It is also available in a generic version, which is cheaper than brand name medicines. ?Varenicline (brand names: Chantix, Champix) is a prescription medicine that reduces withdrawal symptoms and cigarette cravings. If you think you'd like to take varenicline and you have a history of depression, anxiety, or heart disease, discuss this with your doctor or nurse before taking the medicine. Varenicline can also increase the effects of alcohol in some people. It's a good idea to limit drinking while you're taking it, at least until you know how it affects you.  How does counseling work? Counseling can happen during formal office visits or just over the phone. A counselor can help you: ?Figure out what triggers your smoking and what to do instead ?Overcome cravings ?Figure out  what went wrong when you tried to quit before  What works best? Studies show that people have the best luck at quitting if they take medicines to help them quit and work with a Veterinary surgeon. It might also be helpful to combine nicotine replacement with one of the prescription medicines that help people quit. In some cases, it might even make sense to take bupropion and varenicline together.  What about e-cigarettes? Sometimes people wonder if using electronic cigarettes, or "e-cigarettes," might help them quit smoking. Using e-cigarettes is also called "vaping." Doctors do not recommend e-cigarettes in place of medicines and counseling. That's because e-cigarettes still contain nicotine as well as other substances that might be harmful. It's not clear how they can affect a person's health in the long term.  Will I gain weight if I  quit? Yes, you might gain a few pounds. But quitting smoking will have a much more positive effect on your health than weighing a few pounds more. Plus, you can help prevent some weight gain by being more active and eating less. Taking the medicine bupropion might help control weight gain.   What else can I do to improve my chances of quitting? You can: ?Start exercising. ?Stay away from smokers and places that you associate with smoking. If people close to you smoke, ask them to quit with you. ?Keep gum, hard candy, or something to put in your mouth handy. If you get a craving for a cigarette, try one of these instead. ?Don't give up, even if you start smoking again. It takes most people a few tries before they succeed.  What if I am pregnant and I smoke? If you are pregnant, it's really important for the health of your baby that you quit. Ask your doctor what options you have, and what is safest for your baby  How Can I Prevent Allergic Reactions to Dust Mites?  Remember, having dust mites doesn't mean your house isn't clean. In most areas of the world, these creatures live in every home, no matter how clean. But, you can reduce their effects. There are many changes you can make to your home to reduce the numbers of these unwanted "guests."  Studies show that more dust mites live in your bedroom than anywhere else in your home. So this is the best place to start.  Cover mattresses and pillows in zippered dust-proof covers. These covers are made of a material with pores too small to let dust mites and their waste product through. They are also called allergen-impermeable. Plastic or vinyl covers are the least expensive, but some people find them uncomfortable. You can buy other fabric allergen-impermeable covers from many regular bedding stores.  Wash your sheets and blankets weekly in hot water. You have to wash them in water that's at least 130 degrees Fahrenheit or more to kill dust  mites.  Get rid of all types of fabric that mites love and that you cannot easily wash regularly in hot water. Avoid wall-to-wall carpeting, curtains, blinds, upholstered furniture and down-filled covers and pillows in the bedroom. Put roll-type shades on your windows instead of curtains.  Have someone without a dust mite allergy clean your bedroom. If this is not possible, wear a filtering mask when dusting or vacuuming. Many drug stores carry these items. Dusting and vacuuming stir up dust. So try to do these chores when you can stay out of the bedroom for a while afterward.  Special CERTIFIED filter vacuum cleaners can help to keep  mites and mite waste from getting back into the air. CERTIFIED vacuums are scientifically tested and verified as more suitable for making your home healthier.  Vacuuming is not enough to remove all dust mites and their waste. A large amount of the dust mite population may remain because they live deep inside the stuffing of sofas, chairs, mattresses, pillows and carpeting. Treat other rooms in your house like your bedroom. Here are more tips:  Avoid wall-to-wall carpeting, if possible. If you do use carpeting, mites don't like the type with a short, tight pile as much. Use washable throw rugs over regularly damp-mopped wood, linoleum or tiled floors.  Wash rugs in hot water whenever possible. Cold water isn't as effective. Dry cleaning kills all dust mites and is also good for removing dust mites from living in fabrics. Keep the humidity in your home less than 50 percent. Use a dehumidifier and/or air conditioner to do this.  Use a CERTIFIED filter with your central furnace and air conditioning unit. This can help trap dust mites from your entire home. Freestanding air cleaners only filter air in a limited area. Avoid devices that treat air with heat, electrostatic ions or ozone.  Websites for allergy bedding  covers:  Https://www.asthmaandallergyfriendly.com/  https://www.natlallergy.com/

## 2022-12-22 ENCOUNTER — Telehealth: Payer: Self-pay | Admitting: Internal Medicine

## 2022-12-22 NOTE — Telephone Encounter (Signed)
Dr. Celine Mans are you okay with patient receiving chantix tablets?

## 2022-12-22 NOTE — Telephone Encounter (Signed)
Pharmacy states insurance will not cover the "box" of chantix- but they will cover the tablets. Patient requesting new script be sent for tablets. Please advise

## 2022-12-23 NOTE — Telephone Encounter (Signed)
Attempted to call pt but unable to reach. Left message for her to return call. 

## 2023-01-04 ENCOUNTER — Other Ambulatory Visit: Payer: Self-pay

## 2023-01-04 MED ORDER — VARENICLINE TARTRATE 1 MG PO TABS: 1.0000 mg | ORAL_TABLET | Freq: Two times a day (BID) | ORAL | 3 refills | Status: DC

## 2023-01-04 NOTE — Telephone Encounter (Signed)
Patient checking on Chantix tablets. States pharmacy does not have RX. Pharmacy is  [facility] [address] Patient phone number is [phone].

## 2023-01-04 NOTE — Telephone Encounter (Signed)
Chantix tablets have been sent to patients pharmacy. NFN

## 2023-02-17 ENCOUNTER — Other Ambulatory Visit: Payer: Self-pay | Admitting: Allergy

## 2023-03-16 ENCOUNTER — Ambulatory Visit: Payer: PPO | Admitting: Internal Medicine

## 2023-03-16 ENCOUNTER — Encounter: Payer: Self-pay | Admitting: Internal Medicine

## 2023-03-16 VITALS — BP 120/68 | HR 94 | Ht 60.0 in | Wt 123.4 lb

## 2023-03-16 DIAGNOSIS — Z122 Encounter for screening for malignant neoplasm of respiratory organs: Secondary | ICD-10-CM

## 2023-03-16 DIAGNOSIS — J439 Emphysema, unspecified: Secondary | ICD-10-CM

## 2023-03-16 DIAGNOSIS — J320 Chronic maxillary sinusitis: Secondary | ICD-10-CM | POA: Diagnosis not present

## 2023-03-16 DIAGNOSIS — F17210 Nicotine dependence, cigarettes, uncomplicated: Secondary | ICD-10-CM

## 2023-03-16 DIAGNOSIS — J4489 Other specified chronic obstructive pulmonary disease: Secondary | ICD-10-CM | POA: Diagnosis not present

## 2023-03-16 MED ORDER — BUPROPION HCL ER (SR) 150 MG PO TB12: 150.0000 mg | ORAL_TABLET | Freq: Two times a day (BID) | ORAL | 5 refills | Status: DC

## 2023-03-16 MED ORDER — BREZTRI AEROSPHERE 160-9-4.8 MCG/ACT IN AERO: 2.0000 | INHALATION_SPRAY | Freq: Two times a day (BID) | RESPIRATORY_TRACT | 5 refills | Status: DC

## 2023-03-16 MED ORDER — AZELASTINE HCL 0.1 % NA SOLN: 1.0000 | Freq: Two times a day (BID) | NASAL | 5 refills | Status: DC

## 2023-03-16 NOTE — Patient Instructions (Addendum)
Please schedule follow up scheduled with myself in 6 months.  If my schedule is not open yet, we will contact you with a reminder closer to that time. Please call [phone] if you haven't heard from [place] a month before.   Stop chantix, will try wellbutrin instead.  Continue breztri 2 puffs twice daily.   Continue astelin nasal spray.   Bupropion -- Bupropion (brand names: Zyban, Wellbutrin) is an antidepressant that can be used to help you stop smoking.  Start taking it once per day for three days, then increase to twice daily starting four weeks before the quit date.  You should typically continue for 7 to 12 weeks after you quit smoking.  Please do not stop taking this medication abruptly.  When you are ready to stop we will have you go to once daily for two weeks and then you can do once every other day for a week before you stop.   Bupropion is generally well-tolerated, but it may cause dry mouth and difficulty sleeping. The drug should not be used by people who have a seizure disorder or bipolar (manic-depressive) disorder, and it is not recommended for those who have head trauma, anorexia nervosa, or bulimia, or for those who drink alcohol excessively.

## 2023-03-16 NOTE — Progress Notes (Signed)
Sheryl Campbell    [id number]    [birth year]  Primary Care Physician:Elkins, Curly Rim, MD Date of Appointment: 03/16/2023 Established Patient Visit  Chief complaint:   Chief Complaint  Patient presents with   Follow-up    Pt states she has been doing better.      HPI: Sheryl Campbell is a 62 y.o. woman with COPD, chronic rhinitis, tobacco use disorder. Was hospitalized in April 2024.   Interval Updates: Here for follow up. Notes her oxygen level is low sometimes at home. 97% on room air. She checks her oxygen saturation periodically throughout the day and has noticed it going into the 80s and then it comes up quickly.   At last visit she started chantix but it made her feel sick to her stomach but she stopped taking it. She tried nicotine patches and it didn't work. Last week has been smoking 0.5 ppd.    Cough was improved when she stopped smoking but has since returned. No sputum production or hemoptysis.    We discussed disease management and progression at length today.  I have reviewed the patient's family social and past medical history and updated as appropriate.   Past Medical History:  Diagnosis Date   Arthritis    Bell's palsy    Cancer (HCC)    skin (not melanoma)   Fibromyalgia    Intervertebral disk disease    "my whole spine"   Osteopenia    Sciatica    Scoliosis     Past Surgical History:  Procedure Laterality Date   BACK SURGERY  05/2004   Harrington rods T3- T11   BUNIONECTOMY Bilateral 07/2001   CHOLECYSTECTOMY  02/2000   NECK SURGERY  10/2002   cervical diskectomy, C5-C7   NOSE SURGERY  07/1993   deviated septum   ORIF ANKLE FRACTURE Right 12/18/2019   Procedure: OPEN REDUCTION INTERNAL FIXATION RIGHT ANKLE FRACTURE;  Surgeon: Cammy Copa, MD;  Location: [facility] OR;  Service: Orthopedics;  Laterality: Right;    Family History  Problem Relation Age of Onset   Cancer - Lung Mother        smoker   Lung cancer Mother     Diabetes Sister    Diabetes Brother    Cancer Maternal Aunt        Anal   Breast cancer Maternal Aunt    Lung cancer Maternal Aunt     Social History   Occupational History    Comment: disabled  Tobacco Use   Smoking status: Every Day    Current packs/day: 0.00    Average packs/day: 1 pack/day for 40.0 years (40.0 ttl pk-yrs)    Types: Cigarettes    Start date: 07/21/1981    Last attempt to quit: 07/21/2021    Years since quitting: 1.6    Passive exposure: Current   Smokeless tobacco: Never  Vaping Use   Vaping status: Former  Substance and Sexual Activity   Alcohol use: No    Comment: rare   Drug use: No   Sexual activity: Not on file     Physical Exam: Blood pressure 120/68, pulse 94, height 5' (1.524 m), weight 123 lb 6.4 oz (56 kg), SpO2 97%.  Gen:      No acute distress ENT:  no nasal polyps, mucus membranes moist Lungs:    No increased respiratory effort, symmetric chest wall excursion, clear to auscultation bilaterally, no wheezes or crackles CV:  Regular rate and rhythm; no murmurs, rubs, or gallops.  No pedal edema   Data Reviewed: Imaging: I have personally reviewed the ctpe study August 2023 - negative for PE, diffuse ground glass opacities consistnet with pneumonia  PFTs:   Labs:  Immunization status:  There is no immunization history on file for this patient.  External Records Personally Reviewed:   Assessment:  COPD Tobacco use disorder Need for lung cancer screening  Plan/Recommendations:  Stop chantix, will try wellbutrin instead.  Continue breztri 2 puffs twice daily.  Enrolled in LDCT for lung cancer screening. Overdue for next scan will order.   Did an ambulatory desat study - did not drop to the point where she qualified for home oxygen.   Smoking Cessation Counseling:  1. The patient is an everyday smoker and symptomatic due to the following condition copd 2. The patient is currently contemplative in quitting smoking. 3.  I advised patient to quit smoking. 4. We identified patient specific barriers to change.  5. I personally spent 3 minutes counseling the patient regarding tobacco use disorder. 6. We discussed management of stress and anxiety to help with smoking cessation, when applicable. 7. We discussed nicotine replacement therapy, Wellbutrin, Chantix as possible options. 8. I advised setting a quit date. 9. Follow?up arranged with our office to continue ongoing discussions. 10.Resources given to patient including quit hotline.     Return to Care: Return in about 6 months (around 09/16/2023).   Durel Salts, MD Pulmonary and Critical Care Medicine [facility] Office:[phone]

## 2023-04-05 ENCOUNTER — Other Ambulatory Visit (HOSPITAL_COMMUNITY): Payer: Self-pay

## 2023-04-15 ENCOUNTER — Ambulatory Visit (HOSPITAL_COMMUNITY): Payer: PPO

## 2023-04-23 ENCOUNTER — Ambulatory Visit (HOSPITAL_COMMUNITY)
Admission: RE | Admit: 2023-04-23 | Discharge: 2023-04-23 | Disposition: A | Discharge status: Home / Self Care | Payer: PPO | Source: Ambulatory Visit | Attending: Internal Medicine

## 2023-04-23 DIAGNOSIS — F17210 Nicotine dependence, cigarettes, uncomplicated: Secondary | ICD-10-CM | POA: Diagnosis not present

## 2023-04-23 DIAGNOSIS — Z122 Encounter for screening for malignant neoplasm of respiratory organs: Secondary | ICD-10-CM | POA: Insufficient documentation

## 2023-08-28 IMAGING — MR MR LUMBAR SPINE W/O CM
4 of 5 series · 27 of 48 positions shown · non-contrast
Comparison: MR lumbar 04/13/2019; X-ray lumbar 07/09/2021.

CLINICAL DATA: Low back, right leg and groin pain since June 2021. History of scoliosis.

EXAM:
MRI LUMBAR SPINE WITHOUT CONTRAST
TECHNIQUE: Multiplanar, multisequence MR imaging of the lumbar spine was
performed. No intravenous contrast was administered.

[Series 3: T2 · sagittal · 4.0mm · 1.09mm/px · 6 of 17 slices shown (1 of 2)]
[im 1/17]
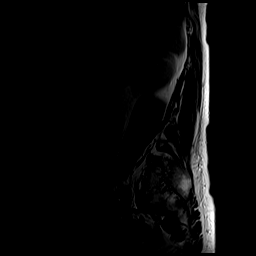
[im 4/17]
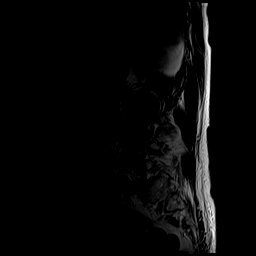
[im 7/17]
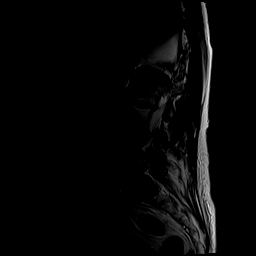
[im 10/17]
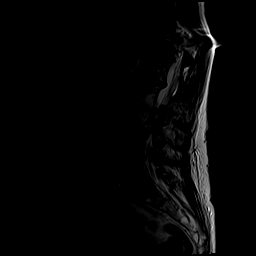
[im 13/17]
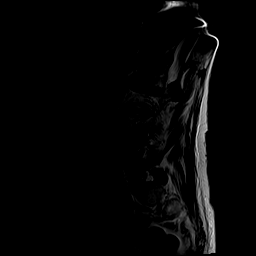
[im 17/17]
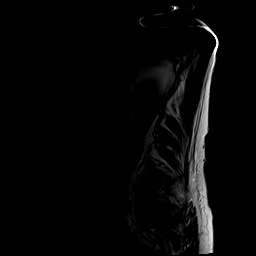

[Series 5: T1 · sagittal · 4.0mm · 1.09mm/px · 7 of 17 slices shown (1 of 2)]
[im 1/17]
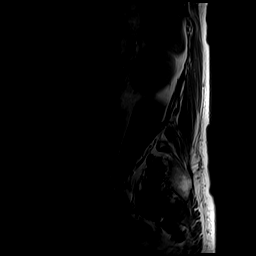
[im 3/17]
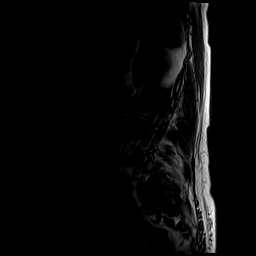
[im 6/17]
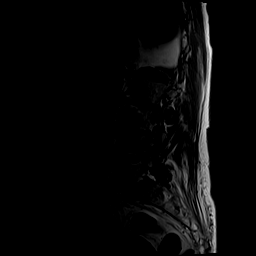
[im 9/17]
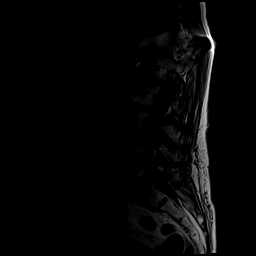
[im 11/17]
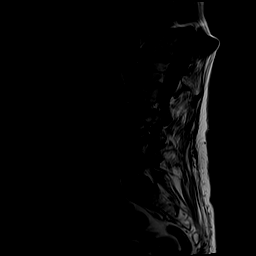
[im 14/17]
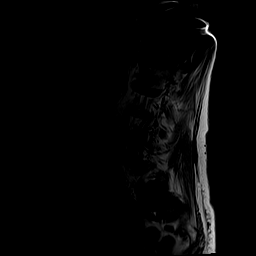
[im 17/17]
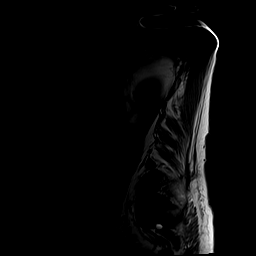

[Series 6: T2 · axial · 4.0mm · 0.39mm/px · z∈[+13,+201]mm · 8 of 37 slices shown (2 of 2)]
[im 1/37]
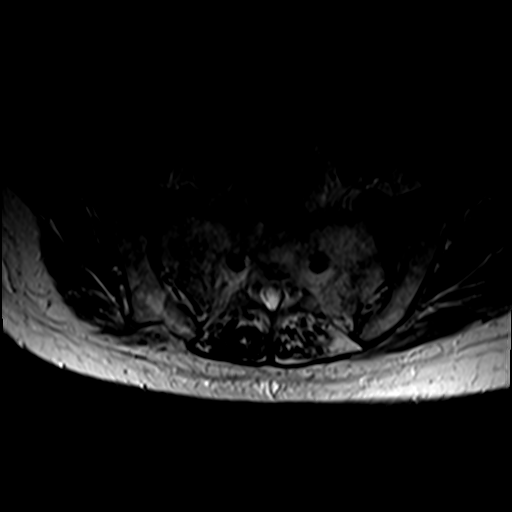
[im 6/37]
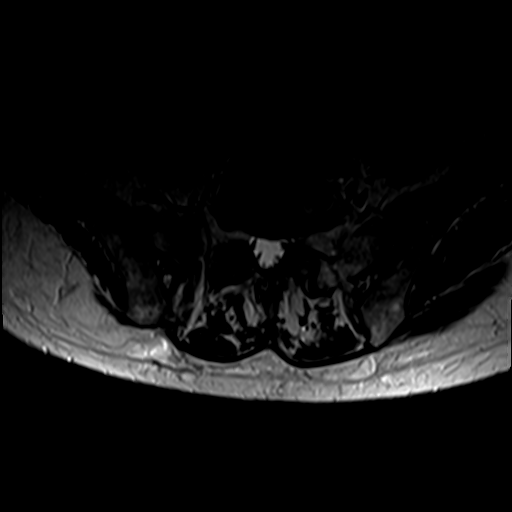
[im 12/37]
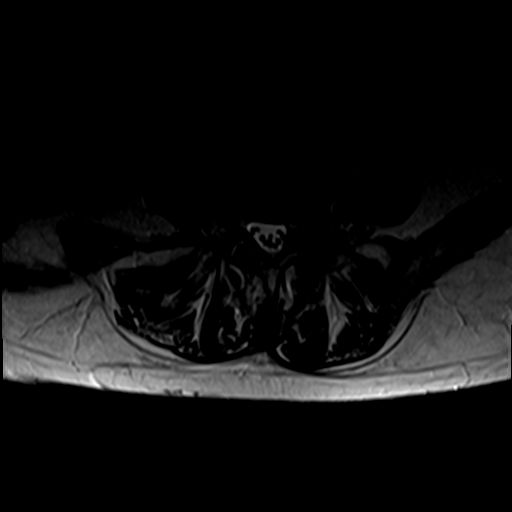
[im 17/37]
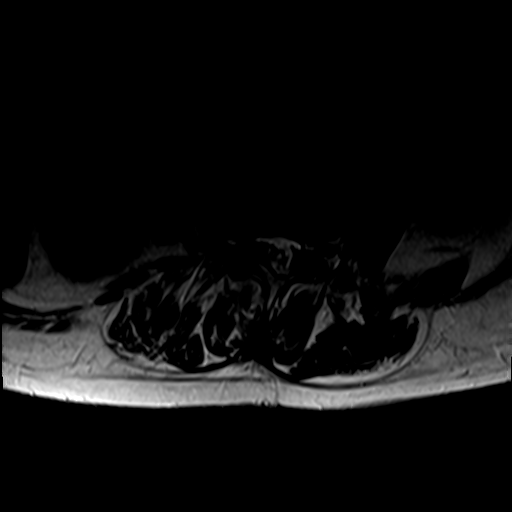
[im 20/37]
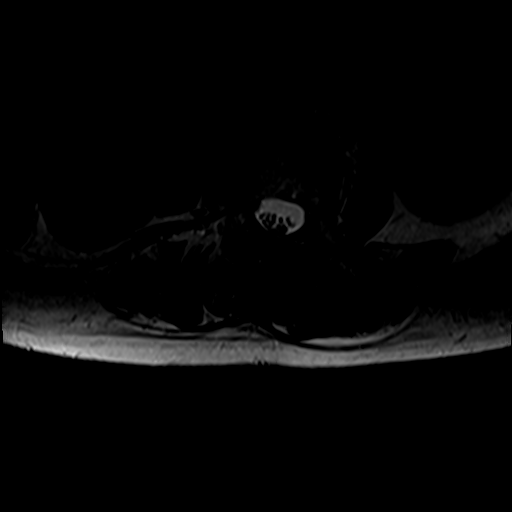
[im 25/37]
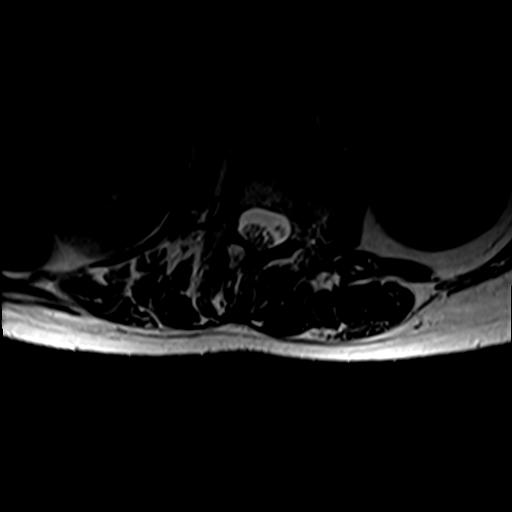
[im 31/37]
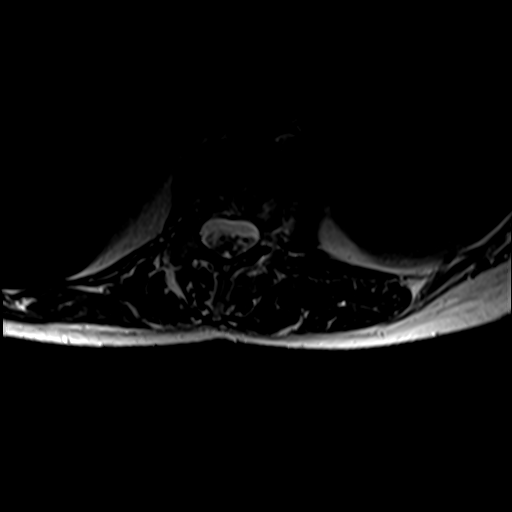
[im 37/37]
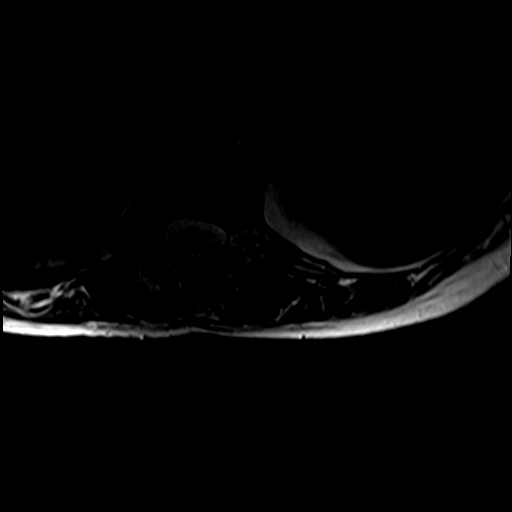

[Series 7: T1 · axial · 4.0mm · 0.39mm/px · z∈[+13,+173]mm · 6 of 37 slices shown (2 of 2)]
[im 1/37]
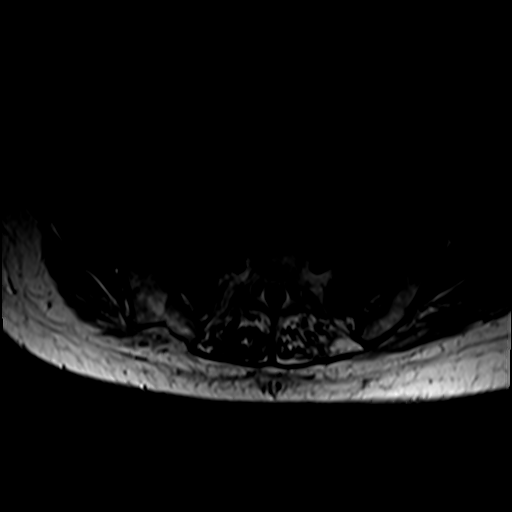
[im 6/37]
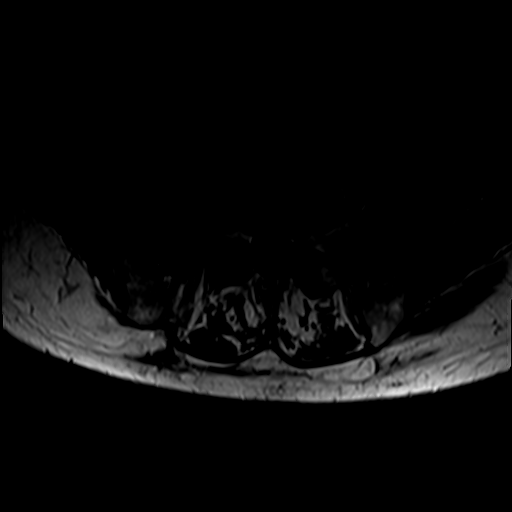
[im 12/37]
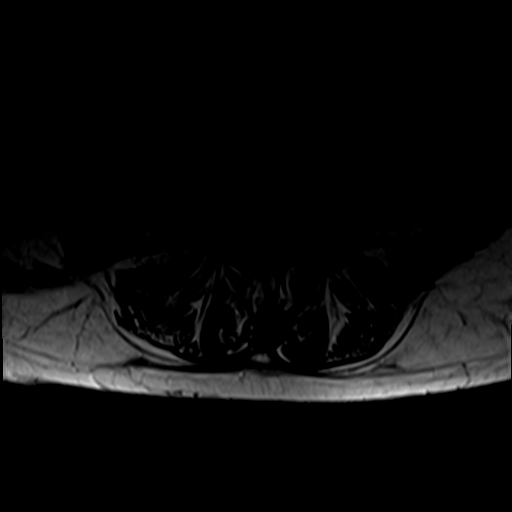
[im 17/37]
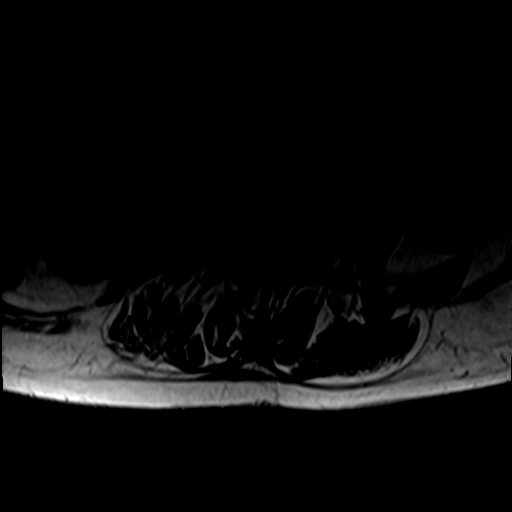
[im 20/37]
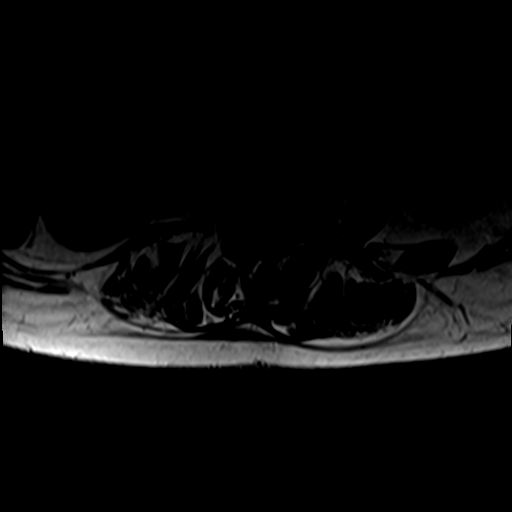
[im 31/37]
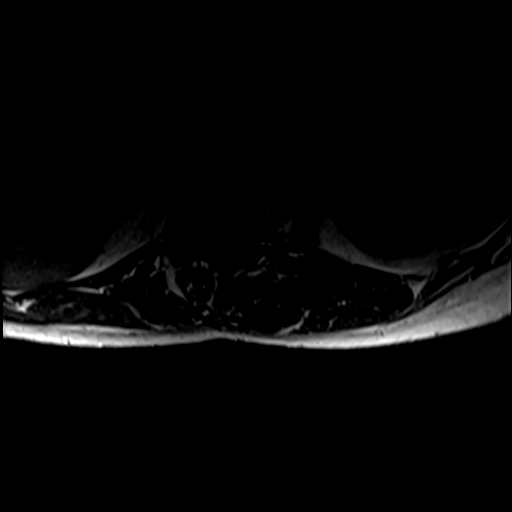

[27 of 48 positions shown; findings below may reference images not displayed]

FINDINGS: Segmentation:  Standard.

Alignment:  Levoscoliosis of the lumbar spine.

Vertebrae: No acute lumbar fracture, evidence of discitis, or
aggressive bone lesion. Chronic L4 vertebral body compression
fracture with approximately 60% central height loss. Bilateral
sacral insufficiency fractures with severe surrounding bone marrow
edema.

Conus medullaris and cauda equina: Conus extends to the T12-L1
level. Conus and cauda equina appear normal.

Paraspinal and other soft tissues: No acute paraspinal abnormality.

Disc levels:

Disc spaces: Degenerative disease with disc height loss at L1-2,
L2-3 and to lesser extent L5-S1.

T12-L1: No significant disc bulge. No neural foraminal stenosis. No
central canal stenosis.

L1-L2: Mild broad-based disc bulge. Mild spinal stenosis. Right
lateral recess stenosis. Mild right facet arthropathy. Mild right
foraminal stenosis. No left foraminal stenosis.

L2-L3: Broad-based disc bulge. Bilateral lateral recess stenosis.
Mild spinal stenosis. Mild left foraminal stenosis. Mild right
foraminal stenosis.

L3-L4: Broad-based disc bulge. Moderate bilateral facet arthropathy.
Moderate spinal stenosis. Moderate bilateral foraminal stenosis.

L4-L5: Broad-based disc bulge. Moderate bilateral facet arthropathy.
Moderate spinal stenosis. Left subarticular recess stenosis. Severe
left foraminal stenosis. No right foraminal stenosis.

L5-S1: Minimal broad-based disc bulge. Moderate left facet
arthropathy. Mild left foraminal stenosis. No right foraminal
stenosis. No spinal stenosis.
IMPRESSION: 1. Bilateral acute sacral insufficiency fractures with severe
surrounding bone marrow edema.
2. Lumbar spine spondylosis as described above.

## 2023-08-30 ENCOUNTER — Ambulatory Visit: Payer: PPO | Admitting: Internal Medicine

## 2023-08-30 ENCOUNTER — Encounter: Payer: Self-pay | Admitting: Internal Medicine

## 2023-08-30 VITALS — BP 123/75 | HR 62 | Resp 16 | Ht 60.0 in | Wt 122.6 lb

## 2023-08-30 DIAGNOSIS — Z122 Encounter for screening for malignant neoplasm of respiratory organs: Secondary | ICD-10-CM

## 2023-08-30 DIAGNOSIS — F17210 Nicotine dependence, cigarettes, uncomplicated: Secondary | ICD-10-CM

## 2023-08-30 DIAGNOSIS — J4489 Other specified chronic obstructive pulmonary disease: Secondary | ICD-10-CM

## 2023-08-30 DIAGNOSIS — Z720 Tobacco use: Secondary | ICD-10-CM

## 2023-08-30 DIAGNOSIS — J439 Emphysema, unspecified: Secondary | ICD-10-CM | POA: Diagnosis not present

## 2023-08-30 NOTE — Patient Instructions (Addendum)
 It was a pleasure to see you today!  Please schedule follow up scheduled with myself in 6 months.  If my schedule is not open yet, we will contact you with a reminder closer to that time. Please call [phone] if you haven't heard from us  a month before, and always call us  sooner if issues or concerns arise. You can also send us  a message through MyChart, but but aware that this is not to be used for urgent issues and it may take up to 5-7 days to receive a reply. Please be aware that you will likely be able to view your results before I have a chance to respond to them. Please give us  5 business days to respond to any non-urgent results.    Continue Breztri  2 puffs twice daily, gargle after use Continue albuterol  inhaler as needed.  Keep working on strategies to reduce stress. Let me know if you want to try another medication for smoking cessation.  Next due for a CT scan for lung cancer screening October 2025.

## 2023-08-30 NOTE — Progress Notes (Signed)
 Sheryl Campbell    [id number]    13-Nov-1960  Primary Care Physician:Elkins, Suzette Espy, MD Date of Appointment: 08/30/2023 Established Patient Visit  Chief complaint:   Chief Complaint  Patient presents with   Follow-up     HPI: Sheryl Campbell is a 63 y.o. woman with COPD, chronic rhinitis, tobacco use disorder. Was hospitalized in April 2024.   Interval Updates: Here for COPD follow up. Has quit smoking and started back multiple times. Currently about 1ppd. Stopped wellbutrin  in December 2024. Felt nauseated on chantix .   Had LDCT for lung cancer screening which was clean.   Being at home is stressful - she takes care of her grandchildren.    No interval hospitalizations, ED visits, or copd exacerbations.   We discussed disease management and progression at length today.   I have reviewed the patient's family social and past medical history and updated as appropriate.   Past Medical History:  Diagnosis Date   Arthritis    Bell's palsy    Cancer (HCC)    skin (not melanoma)   Fibromyalgia    Intervertebral disk disease    "my whole spine"   Osteopenia    Sciatica    Scoliosis     Past Surgical History:  Procedure Laterality Date   BACK SURGERY  05/2004   Harrington rods T3- T11   BUNIONECTOMY Bilateral 07/2001   CHOLECYSTECTOMY  02/2000   NECK SURGERY  10/2002   cervical diskectomy, C5-C7   NOSE SURGERY  07/1993   deviated septum   ORIF ANKLE FRACTURE Right 12/18/2019   Procedure: OPEN REDUCTION INTERNAL FIXATION RIGHT ANKLE FRACTURE;  Surgeon: Jasmine Mesi, MD;  Location: [facility] OR;  Service: Orthopedics;  Laterality: Right;    Family History  Problem Relation Age of Onset   Cancer - Lung Mother        smoker   Lung cancer Mother    Diabetes Sister    Diabetes Brother    Cancer Maternal Aunt        Anal   Breast cancer Maternal Aunt    Lung cancer Maternal Aunt     Social History   Occupational History    Comment: disabled   Tobacco Use   Smoking status: Every Day    Current packs/day: 0.00    Average packs/day: 1 pack/day for 40.0 years (40.0 ttl pk-yrs)    Types: Cigarettes    Start date: 07/21/1981    Last attempt to quit: 07/21/2021    Years since quitting: 2.1    Passive exposure: Current   Smokeless tobacco: Never  Vaping Use   Vaping status: Former  Substance and Sexual Activity   Alcohol use: No    Comment: rare   Drug use: No   Sexual activity: Not on file     Physical Exam: Blood pressure 123/75, pulse 62, resp. rate 16, height 5' (1.524 m), weight 122 lb 9.6 oz (55.6 kg), SpO2 98%.  Gen:      No acute distress, hoarse voice.  Lungs:    diminished, clear CV:        RRR no mrg   Data Reviewed: Imaging: I have personally reviewed the ctpe study August 2023 - negative for PE, diffuse ground glass opacities consistnet with pneumonia  PFTs:   Labs: Lab Results  Component Value Date   WBC 5.8 03/11/2022   HGB 10.9 (L) 03/11/2022   HCT 36.0 03/11/2022   MCV 92.8  03/11/2022   PLT 313 03/11/2022   Lab Results  Component Value Date   NA 137 03/11/2022   K 4.7 03/11/2022   CO2 25 03/11/2022   GLUCOSE 139 (H) 03/11/2022   BUN 14 03/11/2022   CREATININE 0.53 03/11/2022   CALCIUM 8.5 (L) 03/11/2022   GFRNONAA >60 03/11/2022    Immunization status:  There is no immunization history on file for this patient.  External Records Personally Reviewed:   Assessment:  COPD Tobacco use disorder Need for lung cancer screening  Plan/Recommendations: Continue Breztri  2 puffs twice daily, gargle after use Continue albuterol  inhaler as needed.  Keep working on strategies to reduce stress. Let me know if you want to try another medication for smoking cessation.   Smoking Cessation Counseling:  1. The patient is an everyday smoker and symptomatic due to the following condition copd 2. The patient is currently contemplative in quitting smoking. 3. I advised patient to quit smoking. 4.  We identified patient specific barriers to change.  5. I personally spent 4 minutes counseling the patient regarding tobacco use disorder. 6. We discussed management of stress and anxiety to help with smoking cessation, when applicable. 7. We discussed nicotine replacement therapy, Wellbutrin , Chantix  as possible options. 8. I advised setting a quit date. 9. Follow?up arranged with our office to continue ongoing discussions. 10.Resources given to patient including quit hotline.     Return to Care: Return in about 6 months (around 02/27/2024).   Louie Rover, MD Pulmonary and Critical Care Medicine [facility] Office:[phone]

## 2023-09-22 ENCOUNTER — Telehealth: Payer: Self-pay | Admitting: Internal Medicine

## 2023-09-22 MED ORDER — ALBUTEROL SULFATE HFA 108 (90 BASE) MCG/ACT IN AERS: 2.0000 | INHALATION_SPRAY | RESPIRATORY_TRACT | 1 refills | Status: DC | PRN

## 2023-09-22 NOTE — Telephone Encounter (Signed)
 Patient states needs refill for Albuterol inhaler. Pharmacy is [facility] [address]. Patient phone number is [phone].

## 2023-09-22 NOTE — Telephone Encounter (Signed)
 Rx sent to pharmacy

## 2024-01-18 ENCOUNTER — Other Ambulatory Visit: Payer: Self-pay | Admitting: Internal Medicine

## 2024-01-18 MED ORDER — BREZTRI AEROSPHERE 160-9-4.8 MCG/ACT IN AERO: 2.0000 | INHALATION_SPRAY | Freq: Two times a day (BID) | RESPIRATORY_TRACT | 5 refills | Status: DC

## 2024-01-18 NOTE — Telephone Encounter (Signed)
 Copied from CRM [id number]. Topic: Clinical - Medication Refill >> Jan 18, 2024 12:19 PM Corean SAUNDERS wrote: Medication: BREZTRI  AEROSPHERE 160-9-4.8 MCG/ACT AERO  Has the patient contacted their pharmacy? Pharmacy contacted [facility] (Agent: If no, request that the patient contact the pharmacy for the refill. If patient does not wish to contact the pharmacy document the reason why and proceed with request.) (Agent: If yes, when and what did the pharmacy advise?)  This is the patient's preferred pharmacy:  [id number] [address], [place] - [address] AT [facility] [address] [place] [address] Phone: [phone] Fax: [phone]   Is this the correct pharmacy for this prescription? Yes If no, delete pharmacy and type the correct one.   Has the prescription been filled recently? Yes  Is the patient out of the medication? Yes  Has the patient been seen for an appointment in the last year OR does the patient have an upcoming appointment? Yes  Can we respond through MyChart? No  Agent: Please be advised that Rx refills may take up to 3 business days. We ask that you follow-up with your pharmacy.

## 2024-05-29 ENCOUNTER — Ambulatory Visit: Admitting: Internal Medicine

## 2024-06-05 ENCOUNTER — Ambulatory Visit

## 2024-06-05 VITALS — BP 127/78 | HR 80 | Temp 98.2°F | Ht 60.0 in | Wt 114.0 lb

## 2024-06-05 DIAGNOSIS — F17210 Nicotine dependence, cigarettes, uncomplicated: Secondary | ICD-10-CM

## 2024-06-05 DIAGNOSIS — Z23 Encounter for immunization: Secondary | ICD-10-CM | POA: Diagnosis not present

## 2024-06-05 DIAGNOSIS — Z122 Encounter for screening for malignant neoplasm of respiratory organs: Secondary | ICD-10-CM

## 2024-06-05 DIAGNOSIS — J441 Chronic obstructive pulmonary disease with (acute) exacerbation: Secondary | ICD-10-CM

## 2024-06-05 DIAGNOSIS — J42 Unspecified chronic bronchitis: Secondary | ICD-10-CM

## 2024-06-05 DIAGNOSIS — Z720 Tobacco use: Secondary | ICD-10-CM

## 2024-06-05 MED ORDER — ALBUTEROL SULFATE HFA 108 (90 BASE) MCG/ACT IN AERS: 2.0000 | INHALATION_SPRAY | RESPIRATORY_TRACT | 1 refills | Status: AC | PRN

## 2024-06-05 MED ORDER — BREZTRI AEROSPHERE 160-9-4.8 MCG/ACT IN AERO: 2.0000 | INHALATION_SPRAY | Freq: Two times a day (BID) | RESPIRATORY_TRACT | 5 refills | Status: AC

## 2024-06-05 MED ORDER — AZITHROMYCIN 250 MG PO TABS: ORAL_TABLET | ORAL | 0 refills | Status: AC

## 2024-06-05 NOTE — Progress Notes (Signed)
 Sheryl Campbell    [id number]    Aug 14, 1960  Primary Care Physician:Elkins, Tanda Mae, MD Date of Appointment: 06/05/2024 Established Patient Visit  Chief complaint:   Chief Complaint  Patient presents with   COPD    Patient states she's a little, but other than that her breathing is good.     HPI: Sheryl Campbell is a 63 y.o. woman with COPD, chronic rhinitis, tobacco use disorder. Was hospitalized in April 2024.  She used to follow with Dr Meade Here to establish care with me  Interval Updates: Here for COPD follow up. Has quit smoking and started back multiple times. Currently smoking about 1ppd. Stopped wellbutrin  in December 2024. Felt nauseated on chantix .   Has been on breztri  for a year and reports symptoms have improved on it No recent exacerbations However people at home currently have cold and she feels more congested. No fever, chills or shortness of breath   Had LDCT for lung cancer screening 03/2023. Due for one now  Being at home is stressful - she takes care of her grandchildren.    No interval hospitalizations, ED visits, or copd exacerbations.   We discussed disease management and progression at length today.   I have reviewed the patient's family social and past medical history and updated as appropriate.   Past Medical History:  Diagnosis Date   Arthritis    Bell's palsy    Cancer (HCC)    skin (not melanoma)   Fibromyalgia    Intervertebral disk disease    my whole spine   Osteopenia    Sciatica    Scoliosis     Past Surgical History:  Procedure Laterality Date   BACK SURGERY  05/2004   Harrington rods T3- T11   BUNIONECTOMY Bilateral 07/2001   CHOLECYSTECTOMY  02/2000   NECK SURGERY  10/2002   cervical diskectomy, C5-C7   NOSE SURGERY  07/1993   deviated septum   ORIF ANKLE FRACTURE Right 12/18/2019   Procedure: OPEN REDUCTION INTERNAL FIXATION RIGHT ANKLE FRACTURE;  Surgeon: Addie Cordella Hamilton, MD;  Location: [facility]  OR;  Service: Orthopedics;  Laterality: Right;    Family History  Problem Relation Age of Onset   Cancer - Lung Mother        smoker   Lung cancer Mother    Diabetes Sister    Diabetes Brother    Cancer Maternal Aunt        Anal   Breast cancer Maternal Aunt    Lung cancer Maternal Aunt     Social History   Occupational History    Comment: disabled  Tobacco Use   Smoking status: Every Day    Current packs/day: 0.00    Average packs/day: 1 pack/day for 40.0 years (40.0 ttl pk-yrs)    Types: Cigarettes    Start date: 07/21/1981    Last attempt to quit: 07/21/2021    Years since quitting: 2.8    Passive exposure: Current   Smokeless tobacco: Never   Tobacco comments:    Started smoking at age 78.  1 pack a day. 06/05/2024  Vaping Use   Vaping status: Former  Substance and Sexual Activity   Alcohol use: No    Comment: rare   Drug use: No   Sexual activity: Not on file     Physical Exam: Blood pressure 127/78, pulse 80, temperature 98.2 F (36.8 C), temperature source Oral, height 5' (1.524 m), weight 114 lb (  51.7 kg), SpO2 95%.  Gen:      No acute distress, hoarse voice.  Lungs:    rhonchi. B/l, more on bases CV:        RRR no mrg Arthritis    Data Reviewed: Imaging: Ct chest 04/2023  Centrilobular and paraseptal emphysema. Smoking related respiratory bronchiolitis. Mid and lower lung zone pleuroparenchymal scarring. No suspicious pulmonary nodules. No pleural fluid. Mild cylindrical bronchiectasis. Airway is otherwise unremarkable.  I have personally reviewed the ctpe study August 2023 - negative for PE, diffuse ground glass opacities consistnet with pneumonia  PFTs:   Labs: Lab Results  Component Value Date   WBC 5.8 03/11/2022   HGB 10.9 (L) 03/11/2022   HCT 36.0 03/11/2022   MCV 92.8 03/11/2022   PLT 313 03/11/2022   Lab Results  Component Value Date   NA 137 03/11/2022   K 4.7 03/11/2022   CO2 25 03/11/2022   GLUCOSE 139 (H) 03/11/2022    BUN 14 03/11/2022   CREATININE 0.53 03/11/2022   CALCIUM 8.5 (L) 03/11/2022   GFRNONAA >60 03/11/2022    Immunization status: There is no immunization history for the selected administration types on file for this patient.  External Records Personally Reviewed:   Assessment:   Assessment & Plan COPD with acute exacerbation (HCC) Rhonchi and increased sputum production, sick contact at home Advised pt to contact us  if symptoms get worse Orders:   azithromycin  (ZITHROMAX ) 250 MG tablet; Take 2 tablets on day 1, take 1 tablet a day on Day 2-5  Chronic bronchitis, unspecified chronic bronchitis type (HCC) Can't complete PFT due to chronic Bells Palsy Continue current regimen Orders:   albuterol  (VENTOLIN  HFA) 108 (90 Base) MCG/ACT inhaler; Inhale 2 puffs into the lungs every 4 (four) hours as needed for wheezing or shortness of breath. Inhale 1-2 puffs into the lungs  every 4-6 hours as needed for wheezing/ shortness of breath   BREZTRI  AEROSPHERE 160-9-4.8 MCG/ACT AERO inhaler; Inhale 2 puffs into the lungs in the morning and at bedtime.  Tobacco abuse Smoking Cessation Counseling:  1. The patient is an everyday smoker and symptomatic due to the following condition copd 2. The patient is currently contemplative in quitting smoking. 3. I advised patient to quit smoking. 4. We identified patient specific barriers to change.  5. I personally spent 4 minutes counseling the patient regarding tobacco use disorder. 6. We discussed management of stress and anxiety to help with smoking cessation, when applicable. 7. We discussed nicotine replacement therapy, Wellbutrin , Chantix  as possible options. Pt has failed them in the past 8. I advised setting a quit date. 9. Follow?up arranged with our office to continue ongoing discussions. 10.Resources given to patient including quit hotline.      Encounter for screening for lung cancer Reviewed prior scans She does have peripheral  reticulations likely related to prior pneumonia Aug 2023 ct chest shows GGO, pt in the hospital at that time. Arthritis- seen rheumatology- diagnosed with OA Orders:   CT CHEST LUNG CA SCREEN LOW DOSE W/O CM; Future  Need for influenza vaccination  Orders:   Flu vaccine, recombinant, trivalent, inj    Return to Care: Return in about 6 months (around 12/03/2024).  Nolene Rocks Pleas, MD Pulmonary and Critical Care Medicine [facility]

## 2024-06-05 NOTE — Patient Instructions (Addendum)
 It was a pleasure to see you today. Your will receive a call for your CT chest scheduling.  Please rinse your mouth after inhaler use.  Keep working on strategies to reduce stress. Let me know if you want to try another medication for smoking cessation.

## 2024-06-08 ENCOUNTER — Encounter: Payer: Self-pay | Admitting: Oncology

## 2024-10-25 ENCOUNTER — Other Ambulatory Visit
